# Patient Record
Sex: Male | Born: 1935
Health system: Southern US, Community
[De-identification: ages and names within clinical notes are randomized; demographics above are authoritative.]

## PROBLEM LIST (undated history)

## (undated) DIAGNOSIS — K219 Gastro-esophageal reflux disease without esophagitis: Secondary | ICD-10-CM

## (undated) DIAGNOSIS — C189 Malignant neoplasm of colon, unspecified: Secondary | ICD-10-CM

## (undated) DIAGNOSIS — E785 Hyperlipidemia, unspecified: Secondary | ICD-10-CM

## (undated) DIAGNOSIS — I10 Essential (primary) hypertension: Secondary | ICD-10-CM

## (undated) DIAGNOSIS — R7302 Impaired glucose tolerance (oral): Secondary | ICD-10-CM

## (undated) DIAGNOSIS — M48062 Spinal stenosis, lumbar region with neurogenic claudication: Secondary | ICD-10-CM

## (undated) DIAGNOSIS — J189 Pneumonia, unspecified organism: Secondary | ICD-10-CM

## (undated) DIAGNOSIS — E7800 Pure hypercholesterolemia, unspecified: Secondary | ICD-10-CM

## (undated) DIAGNOSIS — C801 Malignant (primary) neoplasm, unspecified: Secondary | ICD-10-CM

## (undated) DIAGNOSIS — R229 Localized swelling, mass and lump, unspecified: Secondary | ICD-10-CM

## (undated) DIAGNOSIS — Z87442 Personal history of urinary calculi: Secondary | ICD-10-CM

## (undated) DIAGNOSIS — Z973 Presence of spectacles and contact lenses: Secondary | ICD-10-CM

## (undated) DIAGNOSIS — C229 Malignant neoplasm of liver, not specified as primary or secondary: Secondary | ICD-10-CM

## (undated) DIAGNOSIS — M1990 Unspecified osteoarthritis, unspecified site: Secondary | ICD-10-CM

## (undated) HISTORY — DX: Essential (primary) hypertension: I10

## (undated) HISTORY — DX: Pure hypercholesterolemia, unspecified: E78.00

## (undated) HISTORY — PX: APPENDECTOMY: SHX54

## (undated) HISTORY — PX: MULTIPLE TOOTH EXTRACTIONS: SHX2053

## (undated) HISTORY — DX: Impaired glucose tolerance (oral): R73.02

## (undated) HISTORY — DX: Hyperlipidemia, unspecified: E78.5

## (undated) HISTORY — PX: COLONOSCOPY W/ BIOPSIES AND POLYPECTOMY: SHX1376

---

## 2003-12-04 ENCOUNTER — Encounter: Admission: RE | Admit: 2003-12-04 | Discharge: 2003-12-04 | Payer: Self-pay | Admitting: Neurological Surgery

## 2004-02-20 ENCOUNTER — Encounter: Admission: RE | Admit: 2004-02-20 | Discharge: 2004-02-20 | Payer: Self-pay | Admitting: Neurological Surgery

## 2009-07-19 ENCOUNTER — Encounter: Payer: Self-pay | Admitting: Cardiology

## 2009-07-20 ENCOUNTER — Encounter: Payer: Self-pay | Admitting: Cardiology

## 2009-09-03 DIAGNOSIS — E785 Hyperlipidemia, unspecified: Secondary | ICD-10-CM | POA: Insufficient documentation

## 2009-09-03 DIAGNOSIS — E782 Mixed hyperlipidemia: Secondary | ICD-10-CM | POA: Insufficient documentation

## 2009-09-04 ENCOUNTER — Ambulatory Visit: Payer: Self-pay | Admitting: Cardiology

## 2009-09-04 DIAGNOSIS — I491 Atrial premature depolarization: Secondary | ICD-10-CM | POA: Insufficient documentation

## 2009-09-04 DIAGNOSIS — R002 Palpitations: Secondary | ICD-10-CM

## 2009-09-13 ENCOUNTER — Encounter: Payer: Self-pay | Admitting: Cardiology

## 2009-09-13 ENCOUNTER — Ambulatory Visit: Payer: Self-pay | Admitting: Cardiology

## 2009-09-23 ENCOUNTER — Ambulatory Visit: Payer: Self-pay | Admitting: Cardiology

## 2009-09-25 ENCOUNTER — Encounter: Payer: Self-pay | Admitting: Cardiology

## 2009-09-26 ENCOUNTER — Encounter (INDEPENDENT_AMBULATORY_CARE_PROVIDER_SITE_OTHER): Payer: Self-pay | Admitting: *Deleted

## 2009-09-30 ENCOUNTER — Encounter (INDEPENDENT_AMBULATORY_CARE_PROVIDER_SITE_OTHER): Payer: Self-pay | Admitting: *Deleted

## 2009-10-07 ENCOUNTER — Encounter: Payer: Self-pay | Admitting: Cardiology

## 2009-10-14 ENCOUNTER — Ambulatory Visit: Payer: Self-pay | Admitting: Cardiology

## 2012-06-01 ENCOUNTER — Encounter: Payer: Self-pay | Admitting: *Deleted

## 2012-06-02 ENCOUNTER — Encounter: Payer: Self-pay | Admitting: *Deleted

## 2015-12-17 DIAGNOSIS — I10 Essential (primary) hypertension: Secondary | ICD-10-CM | POA: Diagnosis not present

## 2015-12-17 DIAGNOSIS — E119 Type 2 diabetes mellitus without complications: Secondary | ICD-10-CM | POA: Diagnosis not present

## 2015-12-17 DIAGNOSIS — E7800 Pure hypercholesterolemia, unspecified: Secondary | ICD-10-CM | POA: Diagnosis not present

## 2015-12-17 DIAGNOSIS — N182 Chronic kidney disease, stage 2 (mild): Secondary | ICD-10-CM | POA: Diagnosis not present

## 2015-12-17 DIAGNOSIS — E039 Hypothyroidism, unspecified: Secondary | ICD-10-CM | POA: Diagnosis not present

## 2015-12-24 DIAGNOSIS — I10 Essential (primary) hypertension: Secondary | ICD-10-CM | POA: Diagnosis not present

## 2015-12-24 DIAGNOSIS — E1129 Type 2 diabetes mellitus with other diabetic kidney complication: Secondary | ICD-10-CM | POA: Diagnosis not present

## 2016-04-16 DIAGNOSIS — E7800 Pure hypercholesterolemia, unspecified: Secondary | ICD-10-CM | POA: Diagnosis not present

## 2016-04-16 DIAGNOSIS — Z13220 Encounter for screening for lipoid disorders: Secondary | ICD-10-CM | POA: Diagnosis not present

## 2016-04-16 DIAGNOSIS — N182 Chronic kidney disease, stage 2 (mild): Secondary | ICD-10-CM | POA: Diagnosis not present

## 2016-04-16 DIAGNOSIS — E1129 Type 2 diabetes mellitus with other diabetic kidney complication: Secondary | ICD-10-CM | POA: Diagnosis not present

## 2016-04-16 DIAGNOSIS — E039 Hypothyroidism, unspecified: Secondary | ICD-10-CM | POA: Diagnosis not present

## 2016-04-16 DIAGNOSIS — I10 Essential (primary) hypertension: Secondary | ICD-10-CM | POA: Diagnosis not present

## 2016-04-21 DIAGNOSIS — E7800 Pure hypercholesterolemia, unspecified: Secondary | ICD-10-CM | POA: Diagnosis not present

## 2016-04-21 DIAGNOSIS — I10 Essential (primary) hypertension: Secondary | ICD-10-CM | POA: Diagnosis not present

## 2016-04-21 DIAGNOSIS — E039 Hypothyroidism, unspecified: Secondary | ICD-10-CM | POA: Diagnosis not present

## 2016-04-21 DIAGNOSIS — N182 Chronic kidney disease, stage 2 (mild): Secondary | ICD-10-CM | POA: Diagnosis not present

## 2016-04-21 DIAGNOSIS — N401 Enlarged prostate with lower urinary tract symptoms: Secondary | ICD-10-CM | POA: Diagnosis not present

## 2016-04-21 DIAGNOSIS — M7552 Bursitis of left shoulder: Secondary | ICD-10-CM | POA: Diagnosis not present

## 2016-04-21 DIAGNOSIS — E1129 Type 2 diabetes mellitus with other diabetic kidney complication: Secondary | ICD-10-CM | POA: Diagnosis not present

## 2016-07-28 DIAGNOSIS — Z6824 Body mass index (BMI) 24.0-24.9, adult: Secondary | ICD-10-CM | POA: Diagnosis not present

## 2016-07-28 DIAGNOSIS — M545 Low back pain: Secondary | ICD-10-CM | POA: Diagnosis not present

## 2016-09-24 DIAGNOSIS — Z23 Encounter for immunization: Secondary | ICD-10-CM | POA: Diagnosis not present

## 2016-10-20 DIAGNOSIS — E1129 Type 2 diabetes mellitus with other diabetic kidney complication: Secondary | ICD-10-CM | POA: Diagnosis not present

## 2016-10-20 DIAGNOSIS — N182 Chronic kidney disease, stage 2 (mild): Secondary | ICD-10-CM | POA: Diagnosis not present

## 2016-10-20 DIAGNOSIS — I10 Essential (primary) hypertension: Secondary | ICD-10-CM | POA: Diagnosis not present

## 2016-10-20 DIAGNOSIS — E7800 Pure hypercholesterolemia, unspecified: Secondary | ICD-10-CM | POA: Diagnosis not present

## 2016-10-27 DIAGNOSIS — N183 Chronic kidney disease, stage 3 (moderate): Secondary | ICD-10-CM | POA: Diagnosis not present

## 2016-10-27 DIAGNOSIS — Z0001 Encounter for general adult medical examination with abnormal findings: Secondary | ICD-10-CM | POA: Diagnosis not present

## 2016-12-29 DIAGNOSIS — L570 Actinic keratosis: Secondary | ICD-10-CM | POA: Diagnosis not present

## 2017-04-16 DIAGNOSIS — I10 Essential (primary) hypertension: Secondary | ICD-10-CM | POA: Diagnosis not present

## 2017-04-16 DIAGNOSIS — E782 Mixed hyperlipidemia: Secondary | ICD-10-CM | POA: Diagnosis not present

## 2017-04-16 DIAGNOSIS — E1129 Type 2 diabetes mellitus with other diabetic kidney complication: Secondary | ICD-10-CM | POA: Diagnosis not present

## 2017-04-16 DIAGNOSIS — E7800 Pure hypercholesterolemia, unspecified: Secondary | ICD-10-CM | POA: Diagnosis not present

## 2017-04-16 DIAGNOSIS — E039 Hypothyroidism, unspecified: Secondary | ICD-10-CM | POA: Diagnosis not present

## 2017-04-20 DIAGNOSIS — I10 Essential (primary) hypertension: Secondary | ICD-10-CM | POA: Diagnosis not present

## 2017-04-20 DIAGNOSIS — M7552 Bursitis of left shoulder: Secondary | ICD-10-CM | POA: Diagnosis not present

## 2017-04-20 DIAGNOSIS — E1129 Type 2 diabetes mellitus with other diabetic kidney complication: Secondary | ICD-10-CM | POA: Diagnosis not present

## 2017-04-20 DIAGNOSIS — N401 Enlarged prostate with lower urinary tract symptoms: Secondary | ICD-10-CM | POA: Diagnosis not present

## 2017-04-20 DIAGNOSIS — E7800 Pure hypercholesterolemia, unspecified: Secondary | ICD-10-CM | POA: Diagnosis not present

## 2017-07-06 DIAGNOSIS — Z85828 Personal history of other malignant neoplasm of skin: Secondary | ICD-10-CM | POA: Diagnosis not present

## 2017-07-06 DIAGNOSIS — L570 Actinic keratosis: Secondary | ICD-10-CM | POA: Diagnosis not present

## 2017-07-06 DIAGNOSIS — L259 Unspecified contact dermatitis, unspecified cause: Secondary | ICD-10-CM | POA: Diagnosis not present

## 2017-10-19 DIAGNOSIS — I10 Essential (primary) hypertension: Secondary | ICD-10-CM | POA: Diagnosis not present

## 2017-10-19 DIAGNOSIS — N183 Chronic kidney disease, stage 3 (moderate): Secondary | ICD-10-CM | POA: Diagnosis not present

## 2017-10-19 DIAGNOSIS — E1129 Type 2 diabetes mellitus with other diabetic kidney complication: Secondary | ICD-10-CM | POA: Diagnosis not present

## 2017-10-19 DIAGNOSIS — E782 Mixed hyperlipidemia: Secondary | ICD-10-CM | POA: Diagnosis not present

## 2017-10-19 DIAGNOSIS — E7800 Pure hypercholesterolemia, unspecified: Secondary | ICD-10-CM | POA: Diagnosis not present

## 2017-10-22 DIAGNOSIS — E1129 Type 2 diabetes mellitus with other diabetic kidney complication: Secondary | ICD-10-CM | POA: Diagnosis not present

## 2017-10-22 DIAGNOSIS — Z23 Encounter for immunization: Secondary | ICD-10-CM | POA: Diagnosis not present

## 2017-10-22 DIAGNOSIS — R5383 Other fatigue: Secondary | ICD-10-CM | POA: Diagnosis not present

## 2017-10-22 DIAGNOSIS — I10 Essential (primary) hypertension: Secondary | ICD-10-CM | POA: Diagnosis not present

## 2017-10-22 DIAGNOSIS — Z0001 Encounter for general adult medical examination with abnormal findings: Secondary | ICD-10-CM | POA: Diagnosis not present

## 2017-10-22 DIAGNOSIS — N401 Enlarged prostate with lower urinary tract symptoms: Secondary | ICD-10-CM | POA: Diagnosis not present

## 2017-10-22 DIAGNOSIS — E7800 Pure hypercholesterolemia, unspecified: Secondary | ICD-10-CM | POA: Diagnosis not present

## 2017-11-10 DIAGNOSIS — H40033 Anatomical narrow angle, bilateral: Secondary | ICD-10-CM | POA: Diagnosis not present

## 2017-11-10 DIAGNOSIS — G44219 Episodic tension-type headache, not intractable: Secondary | ICD-10-CM | POA: Diagnosis not present

## 2018-01-04 DIAGNOSIS — L570 Actinic keratosis: Secondary | ICD-10-CM | POA: Diagnosis not present

## 2018-02-01 DIAGNOSIS — L0100 Impetigo, unspecified: Secondary | ICD-10-CM | POA: Diagnosis not present

## 2018-02-01 DIAGNOSIS — L03019 Cellulitis of unspecified finger: Secondary | ICD-10-CM | POA: Diagnosis not present

## 2018-02-08 DIAGNOSIS — L03019 Cellulitis of unspecified finger: Secondary | ICD-10-CM | POA: Diagnosis not present

## 2018-02-15 DIAGNOSIS — S61309A Unspecified open wound of unspecified finger with damage to nail, initial encounter: Secondary | ICD-10-CM | POA: Diagnosis not present

## 2018-02-15 DIAGNOSIS — L0100 Impetigo, unspecified: Secondary | ICD-10-CM | POA: Diagnosis not present

## 2018-03-01 DIAGNOSIS — E039 Hypothyroidism, unspecified: Secondary | ICD-10-CM | POA: Diagnosis not present

## 2018-03-01 DIAGNOSIS — E1129 Type 2 diabetes mellitus with other diabetic kidney complication: Secondary | ICD-10-CM | POA: Diagnosis not present

## 2018-03-01 DIAGNOSIS — E782 Mixed hyperlipidemia: Secondary | ICD-10-CM | POA: Diagnosis not present

## 2018-03-01 DIAGNOSIS — E7800 Pure hypercholesterolemia, unspecified: Secondary | ICD-10-CM | POA: Diagnosis not present

## 2018-03-01 DIAGNOSIS — I10 Essential (primary) hypertension: Secondary | ICD-10-CM | POA: Diagnosis not present

## 2018-03-08 DIAGNOSIS — E7800 Pure hypercholesterolemia, unspecified: Secondary | ICD-10-CM | POA: Diagnosis not present

## 2018-03-08 DIAGNOSIS — E1129 Type 2 diabetes mellitus with other diabetic kidney complication: Secondary | ICD-10-CM | POA: Diagnosis not present

## 2018-03-08 DIAGNOSIS — N401 Enlarged prostate with lower urinary tract symptoms: Secondary | ICD-10-CM | POA: Diagnosis not present

## 2018-03-08 DIAGNOSIS — R5383 Other fatigue: Secondary | ICD-10-CM | POA: Diagnosis not present

## 2018-03-08 DIAGNOSIS — I10 Essential (primary) hypertension: Secondary | ICD-10-CM | POA: Diagnosis not present

## 2018-07-05 DIAGNOSIS — D485 Neoplasm of uncertain behavior of skin: Secondary | ICD-10-CM | POA: Diagnosis not present

## 2018-07-05 DIAGNOSIS — I10 Essential (primary) hypertension: Secondary | ICD-10-CM | POA: Diagnosis not present

## 2018-07-05 DIAGNOSIS — E7800 Pure hypercholesterolemia, unspecified: Secondary | ICD-10-CM | POA: Diagnosis not present

## 2018-07-05 DIAGNOSIS — N183 Chronic kidney disease, stage 3 (moderate): Secondary | ICD-10-CM | POA: Diagnosis not present

## 2018-07-05 DIAGNOSIS — E1129 Type 2 diabetes mellitus with other diabetic kidney complication: Secondary | ICD-10-CM | POA: Diagnosis not present

## 2018-07-05 DIAGNOSIS — L570 Actinic keratosis: Secondary | ICD-10-CM | POA: Diagnosis not present

## 2018-07-05 DIAGNOSIS — R5383 Other fatigue: Secondary | ICD-10-CM | POA: Diagnosis not present

## 2018-07-08 DIAGNOSIS — N183 Chronic kidney disease, stage 3 (moderate): Secondary | ICD-10-CM | POA: Diagnosis not present

## 2018-07-08 DIAGNOSIS — E7800 Pure hypercholesterolemia, unspecified: Secondary | ICD-10-CM | POA: Diagnosis not present

## 2018-07-08 DIAGNOSIS — E1129 Type 2 diabetes mellitus with other diabetic kidney complication: Secondary | ICD-10-CM | POA: Diagnosis not present

## 2018-07-08 DIAGNOSIS — N401 Enlarged prostate with lower urinary tract symptoms: Secondary | ICD-10-CM | POA: Diagnosis not present

## 2018-07-08 DIAGNOSIS — I10 Essential (primary) hypertension: Secondary | ICD-10-CM | POA: Diagnosis not present

## 2018-09-03 DIAGNOSIS — Z23 Encounter for immunization: Secondary | ICD-10-CM | POA: Diagnosis not present

## 2018-10-11 DIAGNOSIS — J40 Bronchitis, not specified as acute or chronic: Secondary | ICD-10-CM | POA: Diagnosis not present

## 2018-10-19 DIAGNOSIS — L0100 Impetigo, unspecified: Secondary | ICD-10-CM | POA: Diagnosis not present

## 2018-10-27 DIAGNOSIS — D485 Neoplasm of uncertain behavior of skin: Secondary | ICD-10-CM | POA: Diagnosis not present

## 2018-10-27 DIAGNOSIS — L120 Bullous pemphigoid: Secondary | ICD-10-CM | POA: Diagnosis not present

## 2018-10-27 DIAGNOSIS — L859 Epidermal thickening, unspecified: Secondary | ICD-10-CM | POA: Diagnosis not present

## 2018-11-08 DIAGNOSIS — L03011 Cellulitis of right finger: Secondary | ICD-10-CM | POA: Diagnosis not present

## 2018-11-09 DIAGNOSIS — H40033 Anatomical narrow angle, bilateral: Secondary | ICD-10-CM | POA: Diagnosis not present

## 2018-11-09 DIAGNOSIS — H2513 Age-related nuclear cataract, bilateral: Secondary | ICD-10-CM | POA: Diagnosis not present

## 2018-11-11 DIAGNOSIS — E782 Mixed hyperlipidemia: Secondary | ICD-10-CM | POA: Diagnosis not present

## 2018-11-11 DIAGNOSIS — E1129 Type 2 diabetes mellitus with other diabetic kidney complication: Secondary | ICD-10-CM | POA: Diagnosis not present

## 2018-11-11 DIAGNOSIS — E039 Hypothyroidism, unspecified: Secondary | ICD-10-CM | POA: Diagnosis not present

## 2018-11-11 DIAGNOSIS — I10 Essential (primary) hypertension: Secondary | ICD-10-CM | POA: Diagnosis not present

## 2018-11-11 DIAGNOSIS — E7800 Pure hypercholesterolemia, unspecified: Secondary | ICD-10-CM | POA: Diagnosis not present

## 2018-11-14 DIAGNOSIS — R5383 Other fatigue: Secondary | ICD-10-CM | POA: Diagnosis not present

## 2018-11-14 DIAGNOSIS — Z0001 Encounter for general adult medical examination with abnormal findings: Secondary | ICD-10-CM | POA: Diagnosis not present

## 2018-11-14 DIAGNOSIS — I10 Essential (primary) hypertension: Secondary | ICD-10-CM | POA: Diagnosis not present

## 2018-11-14 DIAGNOSIS — E7800 Pure hypercholesterolemia, unspecified: Secondary | ICD-10-CM | POA: Diagnosis not present

## 2018-11-14 DIAGNOSIS — E1129 Type 2 diabetes mellitus with other diabetic kidney complication: Secondary | ICD-10-CM | POA: Diagnosis not present

## 2018-11-22 DIAGNOSIS — L0100 Impetigo, unspecified: Secondary | ICD-10-CM | POA: Diagnosis not present

## 2019-01-04 DIAGNOSIS — L570 Actinic keratosis: Secondary | ICD-10-CM | POA: Diagnosis not present

## 2019-03-13 DIAGNOSIS — E782 Mixed hyperlipidemia: Secondary | ICD-10-CM | POA: Diagnosis not present

## 2019-03-13 DIAGNOSIS — I10 Essential (primary) hypertension: Secondary | ICD-10-CM | POA: Diagnosis not present

## 2019-03-13 DIAGNOSIS — R945 Abnormal results of liver function studies: Secondary | ICD-10-CM | POA: Diagnosis not present

## 2019-03-13 DIAGNOSIS — E1129 Type 2 diabetes mellitus with other diabetic kidney complication: Secondary | ICD-10-CM | POA: Diagnosis not present

## 2019-03-13 DIAGNOSIS — E7800 Pure hypercholesterolemia, unspecified: Secondary | ICD-10-CM | POA: Diagnosis not present

## 2019-03-13 DIAGNOSIS — N183 Chronic kidney disease, stage 3 (moderate): Secondary | ICD-10-CM | POA: Diagnosis not present

## 2019-03-16 DIAGNOSIS — N183 Chronic kidney disease, stage 3 (moderate): Secondary | ICD-10-CM | POA: Diagnosis not present

## 2019-03-16 DIAGNOSIS — I10 Essential (primary) hypertension: Secondary | ICD-10-CM | POA: Diagnosis not present

## 2019-03-16 DIAGNOSIS — E1129 Type 2 diabetes mellitus with other diabetic kidney complication: Secondary | ICD-10-CM | POA: Diagnosis not present

## 2019-03-16 DIAGNOSIS — E7800 Pure hypercholesterolemia, unspecified: Secondary | ICD-10-CM | POA: Diagnosis not present

## 2019-07-03 DIAGNOSIS — Z85828 Personal history of other malignant neoplasm of skin: Secondary | ICD-10-CM | POA: Diagnosis not present

## 2019-07-03 DIAGNOSIS — L570 Actinic keratosis: Secondary | ICD-10-CM | POA: Diagnosis not present

## 2019-07-03 DIAGNOSIS — L259 Unspecified contact dermatitis, unspecified cause: Secondary | ICD-10-CM | POA: Diagnosis not present

## 2019-09-11 DIAGNOSIS — E039 Hypothyroidism, unspecified: Secondary | ICD-10-CM | POA: Diagnosis not present

## 2019-09-11 DIAGNOSIS — E782 Mixed hyperlipidemia: Secondary | ICD-10-CM | POA: Diagnosis not present

## 2019-09-11 DIAGNOSIS — R5383 Other fatigue: Secondary | ICD-10-CM | POA: Diagnosis not present

## 2019-09-11 DIAGNOSIS — I10 Essential (primary) hypertension: Secondary | ICD-10-CM | POA: Diagnosis not present

## 2019-09-11 DIAGNOSIS — N183 Chronic kidney disease, stage 3 (moderate): Secondary | ICD-10-CM | POA: Diagnosis not present

## 2019-09-11 DIAGNOSIS — E1129 Type 2 diabetes mellitus with other diabetic kidney complication: Secondary | ICD-10-CM | POA: Diagnosis not present

## 2019-09-14 DIAGNOSIS — Z6823 Body mass index (BMI) 23.0-23.9, adult: Secondary | ICD-10-CM | POA: Diagnosis not present

## 2019-09-14 DIAGNOSIS — Z23 Encounter for immunization: Secondary | ICD-10-CM | POA: Diagnosis not present

## 2019-09-14 DIAGNOSIS — E7800 Pure hypercholesterolemia, unspecified: Secondary | ICD-10-CM | POA: Diagnosis not present

## 2019-09-14 DIAGNOSIS — R945 Abnormal results of liver function studies: Secondary | ICD-10-CM | POA: Diagnosis not present

## 2019-09-14 DIAGNOSIS — E1129 Type 2 diabetes mellitus with other diabetic kidney complication: Secondary | ICD-10-CM | POA: Diagnosis not present

## 2019-09-14 DIAGNOSIS — I10 Essential (primary) hypertension: Secondary | ICD-10-CM | POA: Diagnosis not present

## 2019-12-01 DIAGNOSIS — M545 Low back pain: Secondary | ICD-10-CM | POA: Diagnosis not present

## 2019-12-01 DIAGNOSIS — M5430 Sciatica, unspecified side: Secondary | ICD-10-CM | POA: Diagnosis not present

## 2020-01-23 DIAGNOSIS — R945 Abnormal results of liver function studies: Secondary | ICD-10-CM | POA: Diagnosis not present

## 2020-01-23 DIAGNOSIS — I10 Essential (primary) hypertension: Secondary | ICD-10-CM | POA: Diagnosis not present

## 2020-01-23 DIAGNOSIS — N183 Chronic kidney disease, stage 3 unspecified: Secondary | ICD-10-CM | POA: Diagnosis not present

## 2020-01-23 DIAGNOSIS — Z6822 Body mass index (BMI) 22.0-22.9, adult: Secondary | ICD-10-CM | POA: Diagnosis not present

## 2020-01-23 DIAGNOSIS — E1129 Type 2 diabetes mellitus with other diabetic kidney complication: Secondary | ICD-10-CM | POA: Diagnosis not present

## 2020-01-23 DIAGNOSIS — E7800 Pure hypercholesterolemia, unspecified: Secondary | ICD-10-CM | POA: Diagnosis not present

## 2020-02-19 DIAGNOSIS — R2689 Other abnormalities of gait and mobility: Secondary | ICD-10-CM | POA: Diagnosis not present

## 2020-02-19 DIAGNOSIS — R531 Weakness: Secondary | ICD-10-CM | POA: Diagnosis not present

## 2020-02-19 DIAGNOSIS — R262 Difficulty in walking, not elsewhere classified: Secondary | ICD-10-CM | POA: Diagnosis not present

## 2020-02-26 DIAGNOSIS — R531 Weakness: Secondary | ICD-10-CM | POA: Diagnosis not present

## 2020-02-26 DIAGNOSIS — R262 Difficulty in walking, not elsewhere classified: Secondary | ICD-10-CM | POA: Diagnosis not present

## 2020-02-26 DIAGNOSIS — Z6822 Body mass index (BMI) 22.0-22.9, adult: Secondary | ICD-10-CM | POA: Diagnosis not present

## 2020-02-26 DIAGNOSIS — R2689 Other abnormalities of gait and mobility: Secondary | ICD-10-CM | POA: Diagnosis not present

## 2020-02-26 DIAGNOSIS — L89319 Pressure ulcer of right buttock, unspecified stage: Secondary | ICD-10-CM | POA: Diagnosis not present

## 2020-02-28 DIAGNOSIS — R2689 Other abnormalities of gait and mobility: Secondary | ICD-10-CM | POA: Diagnosis not present

## 2020-02-28 DIAGNOSIS — R262 Difficulty in walking, not elsewhere classified: Secondary | ICD-10-CM | POA: Diagnosis not present

## 2020-02-28 DIAGNOSIS — R531 Weakness: Secondary | ICD-10-CM | POA: Diagnosis not present

## 2020-03-04 DIAGNOSIS — M545 Low back pain: Secondary | ICD-10-CM | POA: Diagnosis not present

## 2020-03-04 DIAGNOSIS — M48061 Spinal stenosis, lumbar region without neurogenic claudication: Secondary | ICD-10-CM | POA: Diagnosis not present

## 2020-03-04 DIAGNOSIS — M4316 Spondylolisthesis, lumbar region: Secondary | ICD-10-CM | POA: Diagnosis not present

## 2020-03-04 DIAGNOSIS — M5136 Other intervertebral disc degeneration, lumbar region: Secondary | ICD-10-CM | POA: Diagnosis not present

## 2020-03-04 DIAGNOSIS — S32050A Wedge compression fracture of fifth lumbar vertebra, initial encounter for closed fracture: Secondary | ICD-10-CM | POA: Diagnosis not present

## 2020-03-04 DIAGNOSIS — M5137 Other intervertebral disc degeneration, lumbosacral region: Secondary | ICD-10-CM | POA: Diagnosis not present

## 2020-03-09 DIAGNOSIS — I10 Essential (primary) hypertension: Secondary | ICD-10-CM | POA: Diagnosis not present

## 2020-03-09 DIAGNOSIS — Z9181 History of falling: Secondary | ICD-10-CM | POA: Diagnosis not present

## 2020-03-09 DIAGNOSIS — M6281 Muscle weakness (generalized): Secondary | ICD-10-CM | POA: Diagnosis not present

## 2020-03-09 DIAGNOSIS — Z4800 Encounter for change or removal of nonsurgical wound dressing: Secondary | ICD-10-CM | POA: Diagnosis not present

## 2020-03-09 DIAGNOSIS — L89312 Pressure ulcer of right buttock, stage 2: Secondary | ICD-10-CM | POA: Diagnosis not present

## 2020-03-09 DIAGNOSIS — Z7982 Long term (current) use of aspirin: Secondary | ICD-10-CM | POA: Diagnosis not present

## 2020-03-09 DIAGNOSIS — Z79891 Long term (current) use of opiate analgesic: Secondary | ICD-10-CM | POA: Diagnosis not present

## 2020-03-09 DIAGNOSIS — M545 Low back pain: Secondary | ICD-10-CM | POA: Diagnosis not present

## 2020-03-12 DIAGNOSIS — Z7982 Long term (current) use of aspirin: Secondary | ICD-10-CM | POA: Diagnosis not present

## 2020-03-12 DIAGNOSIS — I10 Essential (primary) hypertension: Secondary | ICD-10-CM | POA: Diagnosis not present

## 2020-03-12 DIAGNOSIS — M545 Low back pain: Secondary | ICD-10-CM | POA: Diagnosis not present

## 2020-03-12 DIAGNOSIS — Z79891 Long term (current) use of opiate analgesic: Secondary | ICD-10-CM | POA: Diagnosis not present

## 2020-03-12 DIAGNOSIS — Z9181 History of falling: Secondary | ICD-10-CM | POA: Diagnosis not present

## 2020-03-12 DIAGNOSIS — M6281 Muscle weakness (generalized): Secondary | ICD-10-CM | POA: Diagnosis not present

## 2020-03-12 DIAGNOSIS — Z4800 Encounter for change or removal of nonsurgical wound dressing: Secondary | ICD-10-CM | POA: Diagnosis not present

## 2020-03-12 DIAGNOSIS — L89312 Pressure ulcer of right buttock, stage 2: Secondary | ICD-10-CM | POA: Diagnosis not present

## 2020-03-13 ENCOUNTER — Encounter (HOSPITAL_COMMUNITY): Payer: Self-pay | Admitting: Neurological Surgery

## 2020-03-13 ENCOUNTER — Other Ambulatory Visit: Payer: Self-pay | Admitting: Neurological Surgery

## 2020-03-13 DIAGNOSIS — M21372 Foot drop, left foot: Secondary | ICD-10-CM | POA: Diagnosis not present

## 2020-03-13 DIAGNOSIS — M48062 Spinal stenosis, lumbar region with neurogenic claudication: Secondary | ICD-10-CM | POA: Diagnosis not present

## 2020-03-13 DIAGNOSIS — I10 Essential (primary) hypertension: Secondary | ICD-10-CM | POA: Diagnosis not present

## 2020-03-15 ENCOUNTER — Other Ambulatory Visit: Payer: Self-pay

## 2020-03-15 ENCOUNTER — Encounter (HOSPITAL_COMMUNITY): Payer: Self-pay | Admitting: Neurological Surgery

## 2020-03-16 ENCOUNTER — Other Ambulatory Visit (HOSPITAL_COMMUNITY)
Admission: RE | Admit: 2020-03-16 | Discharge: 2020-03-16 | Disposition: A | Discharge status: Home / Self Care | Payer: Medicare Other | Source: Ambulatory Visit | Attending: Neurological Surgery | Admitting: Neurological Surgery

## 2020-03-16 DIAGNOSIS — M1990 Unspecified osteoarthritis, unspecified site: Secondary | ICD-10-CM | POA: Diagnosis not present

## 2020-03-16 DIAGNOSIS — E7800 Pure hypercholesterolemia, unspecified: Secondary | ICD-10-CM | POA: Diagnosis not present

## 2020-03-16 DIAGNOSIS — L720 Epidermal cyst: Secondary | ICD-10-CM | POA: Diagnosis not present

## 2020-03-16 DIAGNOSIS — M48062 Spinal stenosis, lumbar region with neurogenic claudication: Secondary | ICD-10-CM | POA: Diagnosis not present

## 2020-03-16 DIAGNOSIS — M4726 Other spondylosis with radiculopathy, lumbar region: Secondary | ICD-10-CM | POA: Diagnosis not present

## 2020-03-16 DIAGNOSIS — Z20822 Contact with and (suspected) exposure to covid-19: Secondary | ICD-10-CM | POA: Diagnosis not present

## 2020-03-16 DIAGNOSIS — Z79899 Other long term (current) drug therapy: Secondary | ICD-10-CM | POA: Diagnosis not present

## 2020-03-16 DIAGNOSIS — I10 Essential (primary) hypertension: Secondary | ICD-10-CM | POA: Diagnosis not present

## 2020-03-16 DIAGNOSIS — M21372 Foot drop, left foot: Secondary | ICD-10-CM | POA: Diagnosis not present

## 2020-03-16 LAB — SARS CORONAVIRUS 2 (TAT 6-24 HRS): SARS Coronavirus 2: NEGATIVE

## 2020-03-18 ENCOUNTER — Inpatient Hospital Stay (HOSPITAL_COMMUNITY)
Admission: RE | Admit: 2020-03-18 | Discharge: 2020-03-19 | DRG: 520 | Disposition: A | Discharge status: Home Health | Payer: Medicare Other | Attending: Neurological Surgery | Admitting: Neurological Surgery

## 2020-03-18 ENCOUNTER — Ambulatory Visit (HOSPITAL_COMMUNITY): Payer: Medicare Other | Admitting: Anesthesiology

## 2020-03-18 ENCOUNTER — Ambulatory Visit (HOSPITAL_COMMUNITY): Payer: Medicare Other

## 2020-03-18 ENCOUNTER — Other Ambulatory Visit: Payer: Self-pay

## 2020-03-18 ENCOUNTER — Encounter (HOSPITAL_COMMUNITY)
Admission: RE | Disposition: A | Discharge status: Home / Self Care | Payer: Self-pay | Source: Home / Self Care | Attending: Neurological Surgery

## 2020-03-18 ENCOUNTER — Encounter (HOSPITAL_COMMUNITY): Payer: Self-pay | Admitting: Neurological Surgery

## 2020-03-18 DIAGNOSIS — E7800 Pure hypercholesterolemia, unspecified: Secondary | ICD-10-CM | POA: Diagnosis not present

## 2020-03-18 DIAGNOSIS — L720 Epidermal cyst: Secondary | ICD-10-CM | POA: Diagnosis not present

## 2020-03-18 DIAGNOSIS — M21372 Foot drop, left foot: Secondary | ICD-10-CM | POA: Diagnosis not present

## 2020-03-18 DIAGNOSIS — M4726 Other spondylosis with radiculopathy, lumbar region: Secondary | ICD-10-CM | POA: Diagnosis not present

## 2020-03-18 DIAGNOSIS — M48061 Spinal stenosis, lumbar region without neurogenic claudication: Secondary | ICD-10-CM | POA: Diagnosis present

## 2020-03-18 DIAGNOSIS — Z20822 Contact with and (suspected) exposure to covid-19: Secondary | ICD-10-CM | POA: Diagnosis present

## 2020-03-18 DIAGNOSIS — M5416 Radiculopathy, lumbar region: Secondary | ICD-10-CM | POA: Diagnosis not present

## 2020-03-18 DIAGNOSIS — L723 Sebaceous cyst: Secondary | ICD-10-CM | POA: Diagnosis not present

## 2020-03-18 DIAGNOSIS — E785 Hyperlipidemia, unspecified: Secondary | ICD-10-CM | POA: Diagnosis not present

## 2020-03-18 DIAGNOSIS — M48062 Spinal stenosis, lumbar region with neurogenic claudication: Principal | ICD-10-CM | POA: Diagnosis present

## 2020-03-18 DIAGNOSIS — Z79899 Other long term (current) drug therapy: Secondary | ICD-10-CM | POA: Diagnosis not present

## 2020-03-18 DIAGNOSIS — M1990 Unspecified osteoarthritis, unspecified site: Secondary | ICD-10-CM | POA: Diagnosis present

## 2020-03-18 DIAGNOSIS — Z419 Encounter for procedure for purposes other than remedying health state, unspecified: Secondary | ICD-10-CM

## 2020-03-18 DIAGNOSIS — I10 Essential (primary) hypertension: Secondary | ICD-10-CM | POA: Diagnosis not present

## 2020-03-18 DIAGNOSIS — Z981 Arthrodesis status: Secondary | ICD-10-CM | POA: Diagnosis not present

## 2020-03-18 HISTORY — DX: Gastro-esophageal reflux disease without esophagitis: K21.9

## 2020-03-18 HISTORY — DX: Spinal stenosis, lumbar region with neurogenic claudication: M48.062

## 2020-03-18 HISTORY — PX: MASS EXCISION: SHX2000

## 2020-03-18 HISTORY — DX: Pneumonia, unspecified organism: J18.9

## 2020-03-18 HISTORY — DX: Unspecified osteoarthritis, unspecified site: M19.90

## 2020-03-18 HISTORY — DX: Localized swelling, mass and lump, unspecified: R22.9

## 2020-03-18 HISTORY — DX: Presence of spectacles and contact lenses: Z97.3

## 2020-03-18 HISTORY — DX: Personal history of urinary calculi: Z87.442

## 2020-03-18 HISTORY — PX: LUMBAR LAMINECTOMY/DECOMPRESSION MICRODISCECTOMY: SHX5026

## 2020-03-18 LAB — CBC
HCT: 35.8 % — ABNORMAL LOW (ref 39.0–52.0)
Hemoglobin: 11.5 g/dL — ABNORMAL LOW (ref 13.0–17.0)
MCH: 34.5 pg — ABNORMAL HIGH (ref 26.0–34.0)
MCHC: 32.1 g/dL (ref 30.0–36.0)
MCV: 107.5 fL — ABNORMAL HIGH (ref 80.0–100.0)
Platelets: 310 10*3/uL (ref 150–400)
RBC: 3.33 MIL/uL — ABNORMAL LOW (ref 4.22–5.81)
RDW: 12.3 % (ref 11.5–15.5)
WBC: 8.4 10*3/uL (ref 4.0–10.5)
nRBC: 0 % (ref 0.0–0.2)

## 2020-03-18 LAB — BASIC METABOLIC PANEL
Anion gap: 9 (ref 5–15)
BUN: 23 mg/dL (ref 8–23)
CO2: 26 mmol/L (ref 22–32)
Calcium: 9.3 mg/dL (ref 8.9–10.3)
Chloride: 104 mmol/L (ref 98–111)
Creatinine, Ser: 1.05 mg/dL (ref 0.61–1.24)
GFR calc Af Amer: >60 mL/min (ref >60)
GFR calc non Af Amer: >60 mL/min (ref >60)
Glucose, Bld: 142 mg/dL — ABNORMAL HIGH (ref 70–99)
Potassium: 3.9 mmol/L (ref 3.5–5.1)
Sodium: 139 mmol/L (ref 135–145)

## 2020-03-18 SURGERY — LUMBAR LAMINECTOMY/DECOMPRESSION MICRODISCECTOMY 2 LEVELS
Anesthesia: General | Site: Buttocks | Laterality: Right

## 2020-03-18 MED ORDER — ACETAMINOPHEN 650 MG RE SUPP: 650.0000 mg | RECTAL | Status: DC | PRN

## 2020-03-18 MED ORDER — KETOROLAC TROMETHAMINE 15 MG/ML IJ SOLN
7.5000 mg | Freq: Four times a day (QID) | INTRAMUSCULAR | Status: DC
  Administered 2020-03-18 – 2020-03-19 (×2): 7.5 mg via INTRAVENOUS
  Filled 2020-03-18 (×3): qty 1

## 2020-03-18 MED ORDER — ONDANSETRON HCL 4 MG/2ML IJ SOLN
INTRAMUSCULAR | Status: AC
  Filled 2020-03-18: qty 2

## 2020-03-18 MED ORDER — METHOCARBAMOL 500 MG PO TABS
500.0000 mg | ORAL_TABLET | Freq: Four times a day (QID) | ORAL | Status: DC | PRN
  Administered 2020-03-18: 19:00:00 500 mg via ORAL
  Filled 2020-03-18: qty 1

## 2020-03-18 MED ORDER — LACTATED RINGERS IV SOLN: INTRAVENOUS | Status: DC

## 2020-03-18 MED ORDER — MORPHINE SULFATE (PF) 2 MG/ML IV SOLN: 2.0000 mg | INTRAVENOUS | Status: DC | PRN

## 2020-03-18 MED ORDER — METHOCARBAMOL 500 MG PO TABS
ORAL_TABLET | ORAL | Status: AC
  Filled 2020-03-18: qty 1

## 2020-03-18 MED ORDER — OXYCODONE-ACETAMINOPHEN 5-325 MG PO TABS
ORAL_TABLET | ORAL | Status: AC
  Filled 2020-03-18: qty 1

## 2020-03-18 MED ORDER — SODIUM CHLORIDE 0.9% FLUSH
3.0000 mL | Freq: Two times a day (BID) | INTRAVENOUS | Status: DC
  Administered 2020-03-19 (×2): 3 mL via INTRAVENOUS

## 2020-03-18 MED ORDER — 0.9 % SODIUM CHLORIDE (POUR BTL) OPTIME
TOPICAL | Status: DC | PRN
  Administered 2020-03-18: 15:00:00 1000 mL

## 2020-03-18 MED ORDER — ONDANSETRON HCL 4 MG/2ML IJ SOLN: 4.0000 mg | Freq: Four times a day (QID) | INTRAMUSCULAR | Status: DC | PRN

## 2020-03-18 MED ORDER — PHENYLEPHRINE HCL-NACL 10-0.9 MG/250ML-% IV SOLN
INTRAVENOUS | Status: DC | PRN
  Administered 2020-03-18: 25 ug/min via INTRAVENOUS

## 2020-03-18 MED ORDER — LIDOCAINE 2% (20 MG/ML) 5 ML SYRINGE
INTRAMUSCULAR | Status: DC | PRN
  Administered 2020-03-18: 60 mg via INTRAVENOUS

## 2020-03-18 MED ORDER — ONDANSETRON HCL 4 MG/2ML IJ SOLN
INTRAMUSCULAR | Status: DC | PRN
  Administered 2020-03-18: 4 mg via INTRAVENOUS

## 2020-03-18 MED ORDER — ROCURONIUM BROMIDE 10 MG/ML (PF) SYRINGE
PREFILLED_SYRINGE | INTRAVENOUS | Status: DC | PRN
  Administered 2020-03-18: 40 mg via INTRAVENOUS

## 2020-03-18 MED ORDER — FENTANYL CITRATE (PF) 100 MCG/2ML IJ SOLN
INTRAMUSCULAR | Status: AC
  Filled 2020-03-18: qty 2

## 2020-03-18 MED ORDER — PROPOFOL 10 MG/ML IV BOLUS
INTRAVENOUS | Status: AC
  Filled 2020-03-18: qty 20

## 2020-03-18 MED ORDER — ATORVASTATIN CALCIUM 10 MG PO TABS
20.0000 mg | ORAL_TABLET | Freq: Every day | ORAL | Status: DC
  Administered 2020-03-19: 20 mg via ORAL
  Filled 2020-03-18: qty 2

## 2020-03-18 MED ORDER — SUCCINYLCHOLINE CHLORIDE 200 MG/10ML IV SOSY
PREFILLED_SYRINGE | INTRAVENOUS | Status: AC
  Filled 2020-03-18: qty 10

## 2020-03-18 MED ORDER — HYDROCHLOROTHIAZIDE 25 MG PO TABS: 25.0000 mg | ORAL_TABLET | Freq: Every day | ORAL | Status: DC | PRN

## 2020-03-18 MED ORDER — ALUM & MAG HYDROXIDE-SIMETH 200-200-20 MG/5ML PO SUSP: 30.0000 mL | Freq: Four times a day (QID) | ORAL | Status: DC | PRN

## 2020-03-18 MED ORDER — MENTHOL 3 MG MT LOZG: 1.0000 | LOZENGE | OROMUCOSAL | Status: DC | PRN

## 2020-03-18 MED ORDER — DEXAMETHASONE SODIUM PHOSPHATE 4 MG/ML IJ SOLN
INTRAMUSCULAR | Status: DC | PRN
  Administered 2020-03-18: 5 mg via INTRAVENOUS

## 2020-03-18 MED ORDER — PHENOL 1.4 % MT LIQD: 1.0000 | OROMUCOSAL | Status: DC | PRN

## 2020-03-18 MED ORDER — METHOCARBAMOL 1000 MG/10ML IJ SOLN: 500.0000 mg | Freq: Four times a day (QID) | INTRAVENOUS | Status: DC | PRN

## 2020-03-18 MED ORDER — DEXAMETHASONE SODIUM PHOSPHATE 10 MG/ML IJ SOLN
INTRAMUSCULAR | Status: AC
  Filled 2020-03-18: qty 1

## 2020-03-18 MED ORDER — ROCURONIUM BROMIDE 10 MG/ML (PF) SYRINGE
PREFILLED_SYRINGE | INTRAVENOUS | Status: AC
  Filled 2020-03-18: qty 10

## 2020-03-18 MED ORDER — SODIUM CHLORIDE 0.9 % IV SOLN: 250.0000 mL | INTRAVENOUS | Status: DC

## 2020-03-18 MED ORDER — KETOROLAC TROMETHAMINE 15 MG/ML IJ SOLN
INTRAMUSCULAR | Status: AC
  Filled 2020-03-18: qty 1

## 2020-03-18 MED ORDER — ONDANSETRON HCL 4 MG PO TABS: 4.0000 mg | ORAL_TABLET | Freq: Four times a day (QID) | ORAL | Status: DC | PRN

## 2020-03-18 MED ORDER — FLEET ENEMA 7-19 GM/118ML RE ENEM: 1.0000 | ENEMA | Freq: Once | RECTAL | Status: DC | PRN

## 2020-03-18 MED ORDER — DIPHENHYDRAMINE HCL 50 MG/ML IJ SOLN
INTRAMUSCULAR | Status: DC | PRN
  Administered 2020-03-18: 6.25 mg via INTRAVENOUS

## 2020-03-18 MED ORDER — SODIUM CHLORIDE 0.9 % IV SOLN
INTRAVENOUS | Status: DC | PRN
  Administered 2020-03-18: 500 mL

## 2020-03-18 MED ORDER — EPHEDRINE 5 MG/ML INJ
INTRAVENOUS | Status: AC
  Filled 2020-03-18: qty 10

## 2020-03-18 MED ORDER — THROMBIN 5000 UNITS EX SOLR
CUTANEOUS | Status: AC
  Filled 2020-03-18: qty 5000

## 2020-03-18 MED ORDER — SODIUM CHLORIDE 0.9% FLUSH: 3.0000 mL | INTRAVENOUS | Status: DC | PRN

## 2020-03-18 MED ORDER — TRAMADOL HCL 50 MG PO TABS
50.0000 mg | ORAL_TABLET | Freq: Four times a day (QID) | ORAL | Status: DC
  Administered 2020-03-19 (×2): 50 mg via ORAL
  Filled 2020-03-18 (×2): qty 1

## 2020-03-18 MED ORDER — LIDOCAINE 2% (20 MG/ML) 5 ML SYRINGE
INTRAMUSCULAR | Status: AC
  Filled 2020-03-18: qty 5

## 2020-03-18 MED ORDER — SUCCINYLCHOLINE CHLORIDE 20 MG/ML IJ SOLN
INTRAMUSCULAR | Status: DC | PRN
  Administered 2020-03-18: 100 mg via INTRAVENOUS

## 2020-03-18 MED ORDER — PROMETHAZINE HCL 25 MG/ML IJ SOLN: 6.2500 mg | INTRAMUSCULAR | Status: DC | PRN

## 2020-03-18 MED ORDER — DIPHENHYDRAMINE HCL 50 MG/ML IJ SOLN
INTRAMUSCULAR | Status: AC
  Filled 2020-03-18: qty 1

## 2020-03-18 MED ORDER — SUGAMMADEX SODIUM 200 MG/2ML IV SOLN
INTRAVENOUS | Status: DC | PRN
  Administered 2020-03-18: 136 mg via INTRAVENOUS

## 2020-03-18 MED ORDER — FENTANYL CITRATE (PF) 100 MCG/2ML IJ SOLN
INTRAMUSCULAR | Status: DC | PRN
  Administered 2020-03-18: 100 ug via INTRAVENOUS
  Administered 2020-03-18: 25 ug via INTRAVENOUS
  Administered 2020-03-18: 100 ug via INTRAVENOUS
  Administered 2020-03-18: 25 ug via INTRAVENOUS

## 2020-03-18 MED ORDER — FENTANYL CITRATE (PF) 250 MCG/5ML IJ SOLN
INTRAMUSCULAR | Status: AC
  Filled 2020-03-18: qty 5

## 2020-03-18 MED ORDER — BUPIVACAINE HCL (PF) 0.5 % IJ SOLN
INTRAMUSCULAR | Status: AC
  Filled 2020-03-18: qty 30

## 2020-03-18 MED ORDER — LIDOCAINE-EPINEPHRINE 1 %-1:100000 IJ SOLN
INTRAMUSCULAR | Status: AC
  Filled 2020-03-18: qty 1

## 2020-03-18 MED ORDER — METOPROLOL SUCCINATE ER 25 MG PO TB24
25.0000 mg | ORAL_TABLET | Freq: Every day | ORAL | Status: DC
  Administered 2020-03-19: 25 mg via ORAL
  Filled 2020-03-18: qty 1

## 2020-03-18 MED ORDER — OXYCODONE-ACETAMINOPHEN 5-325 MG PO TABS
1.0000 | ORAL_TABLET | ORAL | Status: DC | PRN
  Administered 2020-03-18: 1 via ORAL

## 2020-03-18 MED ORDER — SENNA 8.6 MG PO TABS
1.0000 | ORAL_TABLET | Freq: Two times a day (BID) | ORAL | Status: DC
  Administered 2020-03-19 (×2): 8.6 mg via ORAL
  Filled 2020-03-18 (×2): qty 1

## 2020-03-18 MED ORDER — BISACODYL 10 MG RE SUPP: 10.0000 mg | Freq: Every day | RECTAL | Status: DC | PRN

## 2020-03-18 MED ORDER — CHLORHEXIDINE GLUCONATE CLOTH 2 % EX PADS: 6.0000 | MEDICATED_PAD | Freq: Once | CUTANEOUS | Status: DC

## 2020-03-18 MED ORDER — VANCOMYCIN HCL IN DEXTROSE 1-5 GM/200ML-% IV SOLN
1000.0000 mg | INTRAVENOUS | Status: AC
  Administered 2020-03-18: 13:00:00 1000 mg via INTRAVENOUS
  Filled 2020-03-18: qty 200

## 2020-03-18 MED ORDER — ACETAMINOPHEN 325 MG PO TABS: 650.0000 mg | ORAL_TABLET | ORAL | Status: DC | PRN

## 2020-03-18 MED ORDER — LIDOCAINE-EPINEPHRINE 1 %-1:100000 IJ SOLN
INTRAMUSCULAR | Status: DC | PRN
  Administered 2020-03-18: 7.5 mL

## 2020-03-18 MED ORDER — PROPOFOL 10 MG/ML IV BOLUS
INTRAVENOUS | Status: DC | PRN
  Administered 2020-03-18: 100 mg via INTRAVENOUS

## 2020-03-18 MED ORDER — LOSARTAN POTASSIUM 50 MG PO TABS
100.0000 mg | ORAL_TABLET | Freq: Every day | ORAL | Status: DC
  Administered 2020-03-19: 100 mg via ORAL
  Filled 2020-03-18: qty 2

## 2020-03-18 MED ORDER — THROMBIN 5000 UNITS EX SOLR: OROMUCOSAL | Status: DC | PRN

## 2020-03-18 MED ORDER — ACETAMINOPHEN 500 MG PO TABS
1000.0000 mg | ORAL_TABLET | Freq: Four times a day (QID) | ORAL | Status: DC
  Administered 2020-03-19 (×3): 1000 mg via ORAL
  Filled 2020-03-18 (×3): qty 2

## 2020-03-18 MED ORDER — DOCUSATE SODIUM 100 MG PO CAPS
100.0000 mg | ORAL_CAPSULE | Freq: Two times a day (BID) | ORAL | Status: DC
  Administered 2020-03-19: 09:00:00 100 mg via ORAL
  Filled 2020-03-18: qty 1

## 2020-03-18 MED ORDER — BUPIVACAINE HCL (PF) 0.5 % IJ SOLN
INTRAMUSCULAR | Status: DC | PRN
  Administered 2020-03-18: 27.5 mL

## 2020-03-18 MED ORDER — CELECOXIB 200 MG PO CAPS
200.0000 mg | ORAL_CAPSULE | Freq: Once | ORAL | Status: AC
  Administered 2020-03-18: 13:00:00 200 mg via ORAL
  Filled 2020-03-18: qty 1

## 2020-03-18 MED ORDER — POLYETHYLENE GLYCOL 3350 17 G PO PACK: 17.0000 g | PACK | Freq: Every day | ORAL | Status: DC | PRN

## 2020-03-18 MED ORDER — FENTANYL CITRATE (PF) 100 MCG/2ML IJ SOLN
25.0000 ug | INTRAMUSCULAR | Status: DC | PRN
  Administered 2020-03-18: 19:00:00 25 ug via INTRAVENOUS

## 2020-03-18 MED ORDER — EPHEDRINE SULFATE 50 MG/ML IJ SOLN
INTRAMUSCULAR | Status: DC | PRN
  Administered 2020-03-18: 10 mg via INTRAVENOUS

## 2020-03-18 MED ORDER — ACETAMINOPHEN 500 MG PO TABS
1000.0000 mg | ORAL_TABLET | Freq: Once | ORAL | Status: AC
  Administered 2020-03-18: 13:00:00 1000 mg via ORAL
  Filled 2020-03-18: qty 2

## 2020-03-18 SURGICAL SUPPLY — 70 items
ADH SKN CLS APL DERMABOND .7 (GAUZE/BANDAGES/DRESSINGS) ×4
APL SKNCLS STERI-STRIP NONHPOA (GAUZE/BANDAGES/DRESSINGS)
BAG DECANTER FOR FLEXI CONT (MISCELLANEOUS) ×4 IMPLANT
BENZOIN TINCTURE PRP APPL 2/3 (GAUZE/BANDAGES/DRESSINGS) IMPLANT
BLADE CLIPPER SURG (BLADE) IMPLANT
BUR ACORN 6.0 (BURR) IMPLANT
BUR ACORN 6.0MM (BURR)
BUR MATCHSTICK NEURO 3.0 LAGG (BURR) ×4 IMPLANT
CABLE BIPOLOR RESECTION CORD (MISCELLANEOUS) IMPLANT
CANISTER SUCT 3000ML PPV (MISCELLANEOUS) ×4 IMPLANT
CARTRIDGE OIL MAESTRO DRILL (MISCELLANEOUS) ×2 IMPLANT
CLOSURE WOUND 1/2 X4 (GAUZE/BANDAGES/DRESSINGS)
COVER WAND RF STERILE (DRAPES) IMPLANT
DECANTER SPIKE VIAL GLASS SM (MISCELLANEOUS) ×4 IMPLANT
DERMABOND ADVANCED (GAUZE/BANDAGES/DRESSINGS) ×4
DERMABOND ADVANCED .7 DNX12 (GAUZE/BANDAGES/DRESSINGS) ×4 IMPLANT
DIFFUSER DRILL AIR PNEUMATIC (MISCELLANEOUS) ×4 IMPLANT
DRAPE HALF SHEET 40X57 (DRAPES) ×4 IMPLANT
DRAPE LAPAROTOMY 100X72X124 (DRAPES) ×4 IMPLANT
DRAPE MICROSCOPE LEICA (MISCELLANEOUS) ×4 IMPLANT
DURAPREP 26ML APPLICATOR (WOUND CARE) ×4 IMPLANT
ELECT REM PT RETURN 9FT ADLT (ELECTROSURGICAL) ×4
ELECTRODE REM PT RTRN 9FT ADLT (ELECTROSURGICAL) ×2 IMPLANT
GAUZE 4X4 16PLY RFD (DISPOSABLE) ×4 IMPLANT
GAUZE SPONGE 4X4 12PLY STRL (GAUZE/BANDAGES/DRESSINGS) IMPLANT
GLOVE BIO SURGEON STRL SZ7.5 (GLOVE) IMPLANT
GLOVE BIOGEL PI IND STRL 6.5 (GLOVE) ×2 IMPLANT
GLOVE BIOGEL PI IND STRL 7.5 (GLOVE) IMPLANT
GLOVE BIOGEL PI IND STRL 8.5 (GLOVE) ×2 IMPLANT
GLOVE BIOGEL PI INDICATOR 6.5 (GLOVE) ×2
GLOVE BIOGEL PI INDICATOR 7.5 (GLOVE)
GLOVE BIOGEL PI INDICATOR 8.5 (GLOVE) ×2
GLOVE ECLIPSE 8.5 STRL (GLOVE) ×4 IMPLANT
GLOVE EXAM NITRILE XL STR (GLOVE) IMPLANT
GLOVE SURG SS PI 6.5 STRL IVOR (GLOVE) ×8 IMPLANT
GOWN STRL REUS W/ TWL LRG LVL3 (GOWN DISPOSABLE) ×4 IMPLANT
GOWN STRL REUS W/ TWL XL LVL3 (GOWN DISPOSABLE) IMPLANT
GOWN STRL REUS W/TWL 2XL LVL3 (GOWN DISPOSABLE) ×4 IMPLANT
GOWN STRL REUS W/TWL LRG LVL3 (GOWN DISPOSABLE) ×8
GOWN STRL REUS W/TWL XL LVL3 (GOWN DISPOSABLE)
HEMOSTAT POWDER KIT SURGIFOAM (HEMOSTASIS) ×4 IMPLANT
KIT BASIN OR (CUSTOM PROCEDURE TRAY) ×4 IMPLANT
KIT TURNOVER KIT B (KITS) ×4 IMPLANT
MARKER SKIN DUAL TIP RULER LAB (MISCELLANEOUS) ×4 IMPLANT
NEEDLE HYPO 22GX1.5 SAFETY (NEEDLE) ×4 IMPLANT
NEEDLE SPNL 20GX3.5 QUINCKE YW (NEEDLE) ×4 IMPLANT
NS IRRIG 1000ML POUR BTL (IV SOLUTION) ×4 IMPLANT
OIL CARTRIDGE MAESTRO DRILL (MISCELLANEOUS) ×4
PACK LAMINECTOMY NEURO (CUSTOM PROCEDURE TRAY) ×4 IMPLANT
PAD ARMBOARD 7.5X6 YLW CONV (MISCELLANEOUS) ×12 IMPLANT
PATTIES SURGICAL .5 X1 (DISPOSABLE) ×4 IMPLANT
RUBBERBAND STERILE (MISCELLANEOUS) ×8 IMPLANT
SPONGE LAP 4X18 RFD (DISPOSABLE) IMPLANT
SPONGE SURGIFOAM ABS GEL SZ50 (HEMOSTASIS) IMPLANT
STAPLER SKIN PROX WIDE 3.9 (STAPLE) IMPLANT
STRIP CLOSURE SKIN 1/2X4 (GAUZE/BANDAGES/DRESSINGS) IMPLANT
SUT PROLENE 6 0 BV (SUTURE) ×4 IMPLANT
SUT VIC AB 0 CT1 18XCR BRD8 (SUTURE) IMPLANT
SUT VIC AB 0 CT1 8-18 (SUTURE)
SUT VIC AB 1 CT1 18XBRD ANBCTR (SUTURE) ×2 IMPLANT
SUT VIC AB 1 CT1 8-18 (SUTURE) ×4
SUT VIC AB 2-0 CP2 18 (SUTURE) ×4 IMPLANT
SUT VIC AB 3-0 SH 8-18 (SUTURE) ×8 IMPLANT
SUT VIC AB 4-0 RB1 18 (SUTURE) ×4 IMPLANT
SWAB COLLECTION DEVICE MRSA (MISCELLANEOUS) IMPLANT
SWAB CULTURE ESWAB REG 1ML (MISCELLANEOUS) IMPLANT
SYR CONTROL 10ML LL (SYRINGE) IMPLANT
TOWEL GREEN STERILE (TOWEL DISPOSABLE) ×4 IMPLANT
TOWEL GREEN STERILE FF (TOWEL DISPOSABLE) ×4 IMPLANT
WATER STERILE IRR 1000ML POUR (IV SOLUTION) ×4 IMPLANT

## 2020-03-18 NOTE — Anesthesia Procedure Notes (Signed)
Procedure Name: Intubation Date/Time: 03/18/2020 3:55 PM Performed by: Kathryne Hitch, CRNA Pre-anesthesia Checklist: Patient identified, Emergency Drugs available, Suction available and Patient being monitored Patient Re-evaluated:Patient Re-evaluated prior to induction Oxygen Delivery Method: Circle system utilized Preoxygenation: Pre-oxygenation with 100% oxygen Induction Type: IV induction Ventilation: Mask ventilation without difficulty Laryngoscope Size: Miller and 3 Grade View: Grade I Tube type: Oral Tube size: 7.5 mm Number of attempts: 1 Airway Equipment and Method: Stylet and Oral airway Placement Confirmation: ETT inserted through vocal cords under direct vision,  positive ETCO2 and breath sounds checked- equal and bilateral Secured at: 22 cm Tube secured with: Tape Dental Injury: Teeth and Oropharynx as per pre-operative assessment

## 2020-03-18 NOTE — Anesthesia Preprocedure Evaluation (Addendum)
Anesthesia Evaluation    Reviewed: Allergy & Precautions, Patient's Chart, lab work & pertinent test results  History of Anesthesia Complications Negative for: history of anesthetic complications  Airway Mallampati: I  TM Distance: >3 FB Neck ROM: Full    Dental   Pulmonary neg pulmonary ROS,    Pulmonary exam normal        Cardiovascular hypertension, Pt. on home beta blockers and Pt. on medications negative cardio ROS Normal cardiovascular exam     Neuro/Psych    GI/Hepatic Neg liver ROS, GERD  ,  Endo/Other  negative endocrine ROS  Renal/GU negative Renal ROS     Musculoskeletal negative musculoskeletal ROS (+)   Abdominal   Peds  Hematology negative hematology ROS (+)   Anesthesia Other Findings Day of surgery medications reviewed with the patient.  Reproductive/Obstetrics                            Anesthesia Physical Anesthesia Plan  ASA: III  Anesthesia Plan: General   Post-op Pain Management:    Induction: Intravenous  PONV Risk Score and Plan: 3 and Ondansetron, Dexamethasone and Diphenhydramine  Airway Management Planned: Oral ETT  Additional Equipment:   Intra-op Plan:   Post-operative Plan: Extubation in OR  Informed Consent:   Plan Discussed with: Anesthesiologist  Anesthesia Plan Comments:         Anesthesia Quick Evaluation

## 2020-03-18 NOTE — Op Note (Signed)
Date of surgery: 03/18/2020 Preoperative diagnosis: Lumbar spinal stenosis L3-4 L4-5 neurogenic claudication, lumbar radiculopathy.  Severe bilateral leg weakness, lesion in Terry buttock. Postoperative diagnosis: Same Procedure: 1.  Bilateral laminotomies and foraminotomies L3-4 and L4-5 decompression of the central canal and the lateral recesses including the L3-L4 and L5 nerve roots. 2.  Resection of subcutaneous mass from Terry buttock, 7 cm in length, circular by 3 cm in depth. Surgeon: Kristeen Miss Anesthesia: General endotracheal Indications: Terry Hernandez is an 86 year old individual who has had progressive weakness in his legs that he states happened in the last 6 months but talking to family members it seems that is been going on much longer than that.  He ultimately underwent an MRI of the lumbar spine which demonstrates that he has high-grade stenosis at L3-4 and L4-5.  Patient also has had a 40 pound weight loss and he notes that he has had a mass on his gluteal region on the Terry side for some 30 years is always told it was benign.  On examination it was noted that the mass had some structure within it even though it was freely ballotable and nonpainful.  I advised that the patient should have surgical excision of this mass.  Procedure: Patient was brought to the operating room supine on the stretcher.  After the smooth induction of general endotracheal anesthesia, he was carefully turned prone.  The back was prepped with alcohol DuraPrep and draped in a sterile fashion.  Then a linear incision was made vertically in the lumbar spine over the L3-4 and L4-5 levels subperiosteal tissues were dissected bluntly to expose for spinous processes and these were identified as L3 and L4 respectively on a localizing radiograph.  Then a subperiosteal dissection was performed out over the facet joints on either side and a self-retaining retractor was placed into the wound laminotomies were created L3 and L4  first on the Terry side starting at L3.  In the midline there was noted to be some CSF in the region and it was felt that this was coming from the opposite side which had not yet been decompressed careful dissection on the opposite side yielded a small pinhole in the surface of the dura which was likely the result of perhaps a previous epidural injection.  Nonetheless this was isolated and closed with a singular 6-0 Prolene suture laminotomies were then completed and care was taken to make sure that the L3 nerve roots were decompressed superiorly using a combination of 2 mm straight and curved rongeurs and curettes.  There was much thickened redundant yellow ligamentous material that was creating the bulk of the high-grade stenosis at L3-4 and L4-5 the condition was even worse similar decompression was undertaken here and in the end L4 and L5 were also decompressed using the same type stools once decompression was completed hemostasis in the wound was checked carefully and then the lumbodorsal fascia was closed with #1 Vicryl interrupted fashion 2-0 Vicryl was used in subcutaneous tissues and 3-0 Vicryl subcuticularly but not until 20 cc of half percent Marcaine was injected into the paraspinous fascia.  Once this was closed we turned our attention to the second part of the procedure.  The Terry buttock had been prepped and draped at the same time of the initial prep and a linear incision was made over the midportion of this mass measuring approximately 8 cm.  Dissection was carried down to the surface of the mass and the capsule was noted unfortunately the capsule ruptured  and thick sebaceous material was encountered.  Care was taken to leave the capsule intact as best possible and a careful dissection was performed around the entirety of the mass once or twice entering the mass inadvertently.  Ultimately the mass was noted to be on a pedicle that was attached to the skin in the lower lateral portion of the  incision.  Because it was firmly attached and I suspected this was a sebaceous cyst we excised the piece of the skin with the mass in total.  Then the skin tag was carefully trimmed to allow closure of an elliptical incision.  The subcutaneous tissues were checked for hemostasis and the wound was irrigated copiously from the debris that may have escaped from the mass.  In the end were able to close the subcutaneous tissue with 3-0 Vicryl in interrupted fashion and the subcuticular skin with 4-0 Vicryl.  Dermabond was placed on the skin on both incisions.  Blood loss for the entirety of the procedure was 200 cc.  Patient tolerated procedure well was returned to recovery room in stable condition.

## 2020-03-18 NOTE — Anesthesia Postprocedure Evaluation (Signed)
Anesthesia Post Note  Patient: Terry Hernandez  Procedure(s) Performed: Bilateral Lumbar three-four Lumbar four-five Laminotomy/foraminotomy (Bilateral Back) Excision of a Right gluteal subcutaneous mass (Right Buttocks)     Patient location during evaluation: PACU Anesthesia Type: General Level of consciousness: awake and alert Pain management: pain level controlled Vital Signs Assessment: post-procedure vital signs reviewed and stable Respiratory status: spontaneous breathing, nonlabored ventilation, respiratory function stable and patient connected to nasal cannula oxygen Cardiovascular status: blood pressure returned to baseline and stable Postop Assessment: no apparent nausea or vomiting Anesthetic complications: no    Last Vitals:  Vitals:   03/18/20 1915 03/18/20 2000  BP: 104/60 120/83  Pulse: 96 94  Resp: (!) 22 18  Temp:    SpO2: 99% 100%    Last Pain:  Vitals:   03/18/20 2000  TempSrc:   PainSc: 5                  Colie Josten DAVID

## 2020-03-18 NOTE — Transfer of Care (Signed)
Immediate Anesthesia Transfer of Care Note  Patient: Terry Hernandez  Procedure(s) Performed: Bilateral Lumbar three-four Lumbar four-five Laminotomy/foraminotomy (Bilateral Back) Excision of a Right gluteal subcutaneous mass (Right Buttocks)  Patient Location: PACU  Anesthesia Type:General  Level of Consciousness: drowsy and patient cooperative  Airway & Oxygen Therapy: Patient Spontanous Breathing and Patient connected to face mask oxygen  Post-op Assessment: Report given to RN and Post -op Vital signs reviewed and stable  Post vital signs: Reviewed and stable  Last Vitals:  Vitals Value Taken Time  BP 101/63 03/18/20 1813  Temp    Pulse 81 03/18/20 1814  Resp 15 03/18/20 1814  SpO2 99 % 03/18/20 1814  Vitals shown include unvalidated device data.  Last Pain:  Vitals:   03/18/20 1315  TempSrc:   PainSc: 6       Patients Stated Pain Goal: 3 ([id number])  Complications: No apparent anesthesia complications

## 2020-03-18 NOTE — H&P (Signed)
CHIEF COMPLAINT: Pain in the back, weakness in the legs, particularly on the left with footdrop.  HISTORY OF PRESENT ILLNESS: [name] is an 86 year old right-handed individual who tells me that he has had some back problems in the past.  In fact, I had seen him a number of years ago and noted that he had some spondylitic stenosis and suggested conservative management.  He had an epidural injection at that time and that did seem to help the problem considerably.  This was in early 2000s.  Nonetheless, he has recurred significant back pain here in the past year, but over the past couple of weeks he has noted his left leg has become weak. Because of this process, he underwent an MRI on March 22 and this study is reviewed in the office.  It demonstrates that [name] has some diffuse spondylosis starting at L1 down to L5, but the worst areas of notable stenosis are at L3-4 and L4-5, where he has nearly complete obliteration of his central spinal canal.  The worst condition is at L4-5.  There is a slight grade 1 anterolisthesis of L4 on L5 on the MRI, and both lateral recesses are severely stenotic. To further his workup today, I obtained a lateral flexion and extension film of the lumbar spine in addition to a singular AP view. The radiographs demonstrate that there are marked spondylitic changes with hypertrophy of the facet, but no abnormal motion between flexion and extension.  The slight 2 mm anterolisthesis is noted at L4 and L5, but this appears to be a fixed problem.   His past medical history reveals that his general health has been good.  He is followed by Dr. Consuello Hernandez.  CURRENT MEDICATIONS: Include Lipitor, Baby Aspirin daily, Losartan, Tramadol, Metoprolol, and a Multivitamin.  SYSTEMS REVIEW: Notable for some arthritis, leg weakness, back pain, leg pain, high cholesterol, difficulty with starting and stopping urinary stream.  He notes that he has had about a 40-pound weight loss  over the past year.  PHYSICAL EXAMINATION: Musculoskeletal: I note that [name] is walking with the use of a walker.  He does demonstrate a footdrop on that left side that is complete.  He also has some gluteal weakness, as he has some difficulty with lifting that left leg proximally.  His right leg appears to function well with good strength in the iliopsoas, quads, tibialis anterior, and the gastrocs.  He has absent reflexes in the patellae and the Achilles.  Straight leg raising is negative at 60 degrees bilaterally. Patrick maneuver is negative.   Overall, the patient appears to have severe spondylitic stenosis at L3-4 and L4-5.  I believe at this point he needs surgical decompression, particularly given the fact that this footdrop has fairly recently occurred. With the degree of high-grade stenosis as he has, I believe the only relief that he is going to see is if he gets this area decompressed.  The other concern I have is that it could affect his bowel and bladder control, and he already notes that he has a severe urgency for urination that has been worse lately.  We will plan on scheduling the surgical decompression via laminotomies bilaterally at L3-4 and L4-5 at the earliest convenience.  I discussed with him the major risks of the surgery.  Of course, he already has some nerve damage, and there is always the risk of more nerve damage, but the biggest concern I have is for the potential for spinal fluid leakage  and this could extend his hospital stay should it occur.  Nonetheless, given the weakness that he is experiencing, surgical decompression is indicated.

## 2020-03-19 MED ORDER — OXYCODONE-ACETAMINOPHEN 5-325 MG PO TABS: 1.0000 | ORAL_TABLET | ORAL | 0 refills | Status: DC | PRN

## 2020-03-19 MED ORDER — METHOCARBAMOL 500 MG PO TABS: 500.0000 mg | ORAL_TABLET | Freq: Four times a day (QID) | ORAL | 3 refills | Status: AC | PRN

## 2020-03-19 NOTE — Discharge Summary (Signed)
Physician Discharge Summary  Patient ID: Terry Hernandez MRN: [id number] DOB/AGE: 05/14/1934 86 y.o.  Admit date: 03/18/2020 Discharge date: 03/19/2020  Admission Diagnoses:Lumbar stenosis  Discharge Diagnoses: Lumbar Stenosis, mass right buttock Active Problems:   Lumbar stenosis with neurogenic claudication   Lumbar spinal stenosis   Discharged Condition: good  Hospital Course: Did fine  Consults: None  Significant Diagnostic Studies: none  Treatments: surgery: Laminectomy L3-4 L45-  Discharge Exam: Blood pressure 120/71, pulse 72, temperature 98.7 F (37.1 C), temperature source Oral, resp. rate 20, height 5' 10.5" (1.791 m), weight 68 kg, SpO2 99 %. incision clean dry gait improved  Disposition: Discharge disposition: 01-Home or Self Care       Discharge Instructions    Call MD for:  redness, tenderness, or signs of infection (pain, swelling, redness, odor or green/yellow discharge around incision site)   Complete by: As directed    Call MD for:  severe uncontrolled pain   Complete by: As directed    Call MD for:  temperature >100.4   Complete by: As directed    Diet - low sodium heart healthy   Complete by: As directed    Discharge instructions   Complete by: As directed    Okay to shower. Do not apply salves or appointments to incision. No heavy lifting with the upper extremities greater than 15 pounds. May resume driving when not requiring pain medication and patient feels comfortable with doing so.   Incentive spirometry RT   Complete by: As directed    Increase activity slowly   Complete by: As directed       Follow-up Information    [place] Follow up.   Why: The home health agency will contact you for the next visit. Contact information: [phone]          Signed: Earleen Newport 03/19/2020, 6:06 PM

## 2020-03-19 NOTE — Progress Notes (Signed)
Patient IV D/C without complication. Personal belongings collected from room. Patient and son verbalized understanding discharge instructions. Patient left unit via wheelchair to personal vehicle driven by son.

## 2020-03-19 NOTE — TOC Initial Note (Signed)
Transition of Care [facility]) - Initial/Assessment Note    Patient Details  Name: Terry Hernandez MRN: [id number] Date of Birth: 1934-09-28  [facility] CM/SW Contact:    Pollie Friar, RN Phone Number: 03/19/2020, 4:44 PM  Clinical Narrative:                 Pt is s/p lumbar surgery. Pt was set up with [facility] prior to admission through his PCP. Pt wants to continue with their services. Butch Penny with [facility] made aware of new orders.  TOC following for additional d/c needs.   Expected Discharge Plan: [place] Barriers to Discharge: Continued Medical Work up   Patient Goals and CMS Choice   CMS Medicare.gov Compare Post Acute Care list provided to:: Patient Choice offered to / list presented to : Patient  Expected Discharge Plan and Services Expected Discharge Plan: [place]   Discharge Planning Services: CM Consult Post Acute Care Choice: [place] arrangements for the past 2 months: Single Family Home                           HH Arranged: PT, OT, RN [facility] Agency: [place] [facility] Date HH Agency Contacted: 03/19/20   Representative spoke with at [place]: Butch Penny  Prior Living Arrangements/Services Living arrangements for the past 2 months: [place] Lives with:: Spouse Patient language and need for interpreter reviewed:: Yes Do you feel safe going back to the place where you live?: Yes      Need for Family Participation in Patient Care: Yes (Comment) Care giver support system in place?: Yes (comment)   Criminal Activity/Legal Involvement Pertinent to Current Situation/Hospitalization: No - Comment as needed  Activities of Daily Living Home Assistive Devices/Equipment: None ADL Screening (condition at time of admission) Patient's cognitive ability adequate to safely complete daily activities?: Yes Is the patient deaf or have difficulty hearing?: No Does the patient have difficulty seeing, even  when wearing glasses/contacts?: No Does the patient have difficulty concentrating, remembering, or making decisions?: No Patient able to express need for assistance with ADLs?: Yes Does the patient have difficulty dressing or bathing?: No Independently performs ADLs?: Yes (appropriate for developmental age) Does the patient have difficulty walking or climbing stairs?: No Weakness of Legs: None Weakness of Arms/Hands: None  Permission Sought/Granted                  Emotional Assessment Appearance:: Appears stated age Attitude/Demeanor/Rapport: Engaged Affect (typically observed): Accepting Orientation: : Oriented to Self, Oriented to Place, Oriented to  Time, Oriented to Situation   Psych Involvement: No (comment)  Admission diagnosis:  Lumbar stenosis with neurogenic claudication [M48.062] Lumbar spinal stenosis [M48.061] Patient Active Problem List   Diagnosis Date Noted  . Lumbar stenosis with neurogenic claudication 03/18/2020  . Lumbar spinal stenosis 03/18/2020  . PREMATURE ATRIAL CONTRACTIONS 09/04/2009  . PALPITATIONS 09/04/2009  . HYPERLIPIDEMIA 09/03/2009   PCP:  Manon Hilding, MD Pharmacy:   [facility] [place] [address] Phone: [phone] Fax: [phone]     Social Determinants of Health (SDOH) Interventions    Readmission Risk Interventions No flowsheet data found.

## 2020-03-19 NOTE — Progress Notes (Signed)
Occupational Therapy Evaluation  PTA, pt lived with his wife and was modified independent with mobility and required Min A with ADL due to apparent B shoulder  RTC insufficiency/arthritis and LE weakness. Pt with L foot drop and scissoring gait pattern. Son states his Dad has walked like this for @ 4 weeks. Spoke with son over the phone and recommended that his Dad have direct assistance with mobility and ADL due to his high risk of falls. Son verbalized understanding. Will attempt to see again prior to DC for family education to facilitate safe DC home with [place].     03/19/20 1400  OT Visit Information  Last OT Received On 03/19/20  Assistance Needed +1  History of Present Illness 86 yo male s/p bilateral L3-5 laminectomy/foraminotomy on 03/18/20. PMH includes HTN, HLD.  Precautions  Precautions Fall;Back  Home Living  Family/patient expects to be discharged to: Private residence  Living Arrangements Spouse/significant other  Available Help at Discharge Family;Available 24 hours/day  Type of Home House  Home Access Stairs to enter  Entrance Stairs-Number of Steps 2  Entrance Stairs-Rails Right  Home Layout One level  Bathroom Shower/Tub Tub/shower unit;Walk-in shower  Bathroom Toilet Handicapped height  Bathroom Accessibility Yes  How Accessible Accessible via walker  [place] - 2 wheels;Walker - 4 wheels;BSC;Crutches;Shower seat  Prior Function  Level of Independence Independent with assistive device(s)  Comments used rollator for ambulation, especially since developing L foot drop. Wife does not physically assist pt as back pain.  Communication  Communication No difficulties  Pain Assessment  Pain Assessment Faces  Faces Pain Scale 2  Pain Location back  Pain Descriptors / Indicators Sore;Discomfort  Pain Intervention(s) Limited activity within patient's tolerance  Cognition  Arousal/Alertness Awake/alert  Behavior During Therapy Impulsive  Overall Cognitive  Status No family/caregiver present to determine baseline cognitive functioning  Area of Impairment Attention;Memory;Safety/judgement;Awareness  Current Attention Level Selective  Memory Decreased recall of precautions;Decreased short-term memory  Following Commands Follows one step commands consistently  Safety/Judgement Decreased awareness of safety;Decreased awareness of deficits  Awareness Emergent  Problem Solving Slow processing;Requires verbal cues  Upper Extremity Assessment  Upper Extremity Assessment Generalized weakness;RUE deficits/detail;LUE deficits/detail  RUE Deficits / Details limited shoulder ROM to@ 30 FF at baseline due to apparent RTC insufficiency  LUE Deficits / Details LUE similar to R; arthritis both UE  Lower Extremity Assessment  Lower Extremity Assessment LLE deficits/detail  LLE Deficits / Details L foot drop with significant incoordination functionally  Cervical / Trunk Assessment  Cervical / Trunk Assessment Kyphotic (recent back surgery)  ADL  Overall ADL's  Needs assistance/impaired  Grooming Set up;Supervision/safety;Standing  Upper Body Bathing Minimal assistance;Sitting  Lower Body Bathing Moderate assistance;Sit to/from stand  Upper Body Dressing  Minimal assistance;Sitting  Lower Body Dressing Moderate assistance;Sit to/from Retail buyer Minimal assistance;Ambulation;RW  Toileting- Clothing Manipulation and Hygiene Minimal assistance;Sit to/from stand  Functional mobility during ADLs Minimal assistance;Rolling walker;Cueing for safety  General ADL Comments Educated pton back precautions for ADL. Pt has difficulty maintaining back precautions - requires frequent vc. "This is the way I was doing it". Explained that pt has to do tasks differently now dueto back precautions. High fall risk due to L foot drop adn unccoordinated movements of LLE>   Vision- History  Baseline Vision/History Wears glasses  Bed Mobility  Overal bed mobility Needs  Assistance  Rolling Supervision  Sidelying to sit Min guard  General bed mobility comments vc for correct tachnique  Transfers  Overall  transfer level Needs assistance  Equipment used Rolling walker (2 wheeled)  Transfers Sit to/from [facility]  Sit to Stand Min guard;From elevated surface  Stand pivot transfers Min assist  Balance  Overall balance assessment Needs assistance  Sitting-balance support Feet supported  Sitting balance-Leahy Scale Fair  Standing balance-Leahy Scale Poor  Standing balance comment reliant on external support. Unable to release RW and maintain balance  OT - End of Session  Equipment Utilized During Treatment Rolling walker  Activity Tolerance Patient tolerated treatment well  Patient left in chair;with call bell/phone within reach;with chair alarm set  Nurse Communication Mobility status;Other (comment) (DC recommendations)  OT Assessment  OT Recommendation/Assessment Patient needs continued OT Services  OT Visit Diagnosis Unsteadiness on feet (R26.81);Other abnormalities of gait and mobility (R26.89);Muscle weakness (generalized) (M62.81);Ataxia, unspecified (R27.0);Pain  Pain - part of body  (back)  OT Problem List Decreased strength;Decreased range of motion;Impaired balance (sitting and/or standing);Decreased coordination;Decreased safety awareness;Decreased knowledge of use of DME or AE;Decreased knowledge of precautions;Impaired tone;Impaired UE functional use;Pain  OT Plan  OT Frequency (ACUTE ONLY) Min 3X/week  OT Treatment/Interventions (ACUTE ONLY) Self-care/ADL training;Therapeutic exercise;Neuromuscular education;DME and/or AE instruction;Therapeutic activities;Patient/family education;Balance training  AM-PAC OT "6 Clicks" Daily Activity Outcome Measure (Version 2)  Help from another person eating meals? 4  Help from another person taking care of personal grooming? 3  Help from another person toileting, which includes using  toliet, bedpan, or urinal? 3  Help from another person bathing (including washing, rinsing, drying)? 2  Help from another person to put on and taking off regular upper body clothing? 3  Help from another person to put on and taking off regular lower body clothing? 2  6 Click Score 17  OT Recommendation  Follow Up Recommendations Home health OT;Supervision/Assistance - 24 hour (Direct assistance  for all ADL and mobility)  OT Equipment None recommended by OT  Individuals Consulted  Consulted and Agree with Results and Recommendations Patient;Family member/caregiver  Family Member Consulted son via phone  Acute Rehab OT Goals  Patient Stated Goal go home with wife  OT Goal Formulation With patient  Time For Goal Achievement 04/02/20  Potential to Achieve Goals Good  OT Time Calculation  OT Start Time (ACUTE ONLY) 1129  OT Stop Time (ACUTE ONLY) 1205  OT Time Calculation (min) 36 min  OT General Charges  $OT Visit 1 Visit  OT Evaluation  $OT Eval Moderate Complexity 1 Mod  OT Treatments  $Self Care/Home Management  8-22 mins  Written Expression  Dominant Hand Right  Maurie Boettcher, OT/L   Acute OT Clinical Specialist [place] Pager [phone] Office [phone]

## 2020-03-19 NOTE — Progress Notes (Signed)
Occupational Therapy Treatment Note  Session focused on family education regarding back precautions and strategies to reduce risks of falls. Toe lift strap placed around L shoe and appears to help clear L foot during mobility. Son educated on use of strap and verbalized understanding. Son able to safely ambulate with his Dad using RW and gait belt. Back precautions handout provided. Son verbalized understanding for need for direct assistance for mobility and ADL. Recommend HHOT.     03/19/20 1600  OT Visit Information  Last OT Received On 03/19/20  Assistance Needed +1  History of Present Illness 86 yo male s/p bilateral L3-5 laminectomy/foraminotomy on 03/18/20. PMH includes HTN, HLD.  Precautions  Precautions Fall;Back  Precaution Booklet Issued Yes (comment)  Pain Assessment  Pain Assessment No/denies pain  Cognition  Arousal/Alertness Awake/alert  Behavior During Therapy Impulsive  General Comments per son, at baseline  ADL  General ADL Comments toe lift strap added L footto reduce foot drag with good result Son/pt educated on donning/doffingstrap. Writen instructions provided.   Transfers  Overall transfer level Needs assistance  Transfers Sit to/from Stand;Stand Pivot Transfers  Sit to Stand Min assist  Stand pivot transfers Min assist  General transfer comment son educated on transfers  General Comments  General comments (skin integrity, edema, etc.) son educated on back precautions and mobility while adhereing to back precautions. Educate on fall precautions and how to safely ambulate with pt. Son able to safely ambulateusinggait belt.Pt with mild LOB during ambulation and son ableto recover - giving goo vc to his Dad. Pt has difficulty holding utensils due to arthiritis. Issued built up tubing to elp with sel feeding.  OT - End of Session  Equipment Utilized During Treatment Gait belt;Rolling walker  Activity Tolerance Patient tolerated treatment well  Patient left in  chair;with call bell/phone within reach;with family/visitor present  Nurse Communication Mobility status;Other (comment) (DC needs)  OT Assessment/Plan  OT Plan Discharge plan remains appropriate  OT Visit Diagnosis Unsteadiness on feet (R26.81);Other abnormalities of gait and mobility (R26.89);Muscle weakness (generalized) (M62.81);Ataxia, unspecified (R27.0);Pain  OT Frequency (ACUTE ONLY) Min 3X/week  Follow Up Recommendations Home health OT;Supervision/Assistance - 24 hour  OT Equipment None recommended by OT  AM-PAC OT "6 Clicks" Daily Activity Outcome Measure (Version 2)  Help from another person eating meals? 4  Help from another person taking care of personal grooming? 3  Help from another person toileting, which includes using toliet, bedpan, or urinal? 3  Help from another person bathing (including washing, rinsing, drying)? 2  Help from another person to put on and taking off regular upper body clothing? 3  Help from another person to put on and taking off regular lower body clothing? 2  6 Click Score 17  OT Goal Progression  Progress towards OT goals Progressing toward goals  Acute Rehab OT Goals  Patient Stated Goal go home with wife  OT Goal Formulation With patient  Time For Goal Achievement 04/02/20  Potential to Achieve Goals Good  ADL Goals  Pt Will Perform Lower Body Bathing with modified independence;sit to/from stand;with adaptive equipment  Pt Will Perform Lower Body Dressing with modified independence;sit to/from stand;with adaptive equipment  Pt Will Transfer to Toilet with modified independence;ambulating;bedside commode  Additional ADL Goal #1 Pt will independently verbalize 3 back precautions  Additional ADL Goal #2 Pt will verbalize 3 strategies to reduce risk of falls  OT Time Calculation  OT Start Time (ACUTE ONLY) 1555  OT Stop Time (ACUTE ONLY) 1630  OT  Time Calculation (min) 35 min  OT General Charges  $OT Visit 1 Visit  OT Treatments  $Self  Care/Home Management  23-37 mins  Maurie Boettcher, OT/L   Acute OT Clinical Specialist [place] Pager [phone] Office [phone]

## 2020-03-19 NOTE — Evaluation (Signed)
Physical Therapy Evaluation Patient Details Name: Terry Hernandez MRN: [id number] DOB: Apr 13, 1934 Today's Date: 03/19/2020   History of Present Illness  86 yo male s/p bilateral L3-5 laminectomy/foraminotomy on 03/18/20. PMH includes HTN, HLD.  Clinical Impression  Pt presents with LE weakness, L foot drop (pt states he is in the process of getting an AFO), incoordination during gait, decreased knowledge and application of spinal precautions, poor standing balance making pt HIGH FALL RISK, and decreased activity tolerance post-op. Pt to benefit from acute PT to address deficits. Pt ambulated hallway distance with use of RW and min assist for steadying, pt with multiple near-tripping moments due to narrow base of support, L foot drop catching on RLE, and scissoring-like gait. Per pt, his wife cannot assist him at home due to her own back issues, but states sons can be of assist as needed post-op. PT encouraged pt to have son with him when up and moving at all times, gait belt administered. PT also discussed pt's openness to post-acute rehab, pt not interested and states "I want to go home with HHPT". PT to progress mobility as tolerated, and will continue to follow acutely.      Follow Up Recommendations Home health PT;Supervision/Assistance - 24 hour    Equipment Recommendations  None recommended by PT    Recommendations for Other Services       Precautions / Restrictions Precautions Precautions: Fall;Back Precaution Booklet Issued: No Precaution Comments: verbally reviewed no bending, lifting, twisting, arching, and practiced log roll technique in and out of bed. OT to administer handout Restrictions Weight Bearing Restrictions: No      Mobility  Bed Mobility Overal bed mobility: Needs Assistance Bed Mobility: Rolling;Sidelying to Sit;Sit to Sidelying Rolling: Min guard Sidelying to sit: Min assist;HOB elevated     Sit to sidelying: Min guard;HOB elevated General bed mobility  comments: min guard for rolling for sequencing, safety. Min assist for sidelying to sit for trunk elevation and steadying. min guard for return to supine using log roll technique. Verbal cuing throughout to maintain back precautions.  Transfers Overall transfer level: Needs assistance Equipment used: Rolling walker (2 wheeled) Transfers: Sit to/from Stand Sit to Stand: Min guard;From elevated surface         General transfer comment: min guard for safety, verbal cuing for hand placement when rising, bending from hips and knees when rising/sitting.  Ambulation/Gait Ambulation/Gait assistance: Min assist Gait Distance (Feet): 200 Feet Assistive device: Rolling walker (2 wheeled) Gait Pattern/deviations: Step-through pattern;Decreased stride length;Narrow base of support;Drifts right/left;Decreased dorsiflexion - left;Decreased stance time - left;Trunk flexed Gait velocity: decr   General Gait Details: min assist to steady, max multimodal cuing for upright posture, placement in RW, widening BOS as pt with many near-scissoring and tripping moments, and increasing L foot clearance with compensatory hip and knee flexion. VERY unsteady  Stairs Stairs: Yes Stairs assistance: Min assist Stair Management: One rail Right;Step to pattern;Forwards Number of Stairs: 3 General stair comments: min assist to steady, verbal cuing for sequencing (ascending with RLE, descending with LLE leading), use of handrail and HHA on non-rail side.  Wheelchair Mobility    Modified Rankin (Stroke Patients Only)       Balance Overall balance assessment: Needs assistance Sitting-balance support: No upper extremity supported;Feet supported Sitting balance-Leahy Scale: Fair     Standing balance support: Bilateral upper extremity supported Standing balance-Leahy Scale: Poor Standing balance comment: reliant on external support in standing, and min assist from PT  Pertinent Vitals/Pain Pain Assessment: Faces Faces Pain Scale: Hurts a little bit Pain Location: back Pain Descriptors / Indicators: Sore;Discomfort Pain Intervention(s): Limited activity within patient's tolerance;Monitored during session;Repositioned    Home Living Family/patient expects to be discharged to:: Private residence Living Arrangements: Spouse/significant other Available Help at Discharge: Family Type of Home: House Home Access: Stairs to enter Entrance Stairs-Rails: Right Entrance Stairs-Number of Steps: 2 Home Layout: One level Home Equipment: Environmental consultant - 2 wheels;Walker - 4 wheels;Bedside commode;Crutches;Shower seat      Prior Function Level of Independence: Independent with assistive device(s)         Comments: used rollator for ambulation, especially since developing L foot drop. Wife does not physically assist pt as back pain.     Hand Dominance   Dominant Hand: Right    Extremity/Trunk Assessment   Upper Extremity Assessment Upper Extremity Assessment: Defer to OT evaluation    Lower Extremity Assessment Lower Extremity Assessment: Generalized weakness;LLE deficits/detail LLE Deficits / Details: L foot drop with significant incoordination functionally    Cervical / Trunk Assessment Cervical / Trunk Assessment: Kyphotic  Communication   Communication: No difficulties  Cognition Arousal/Alertness: Awake/alert Behavior During Therapy: WFL for tasks assessed/performed Overall Cognitive Status: No family/caregiver present to determine baseline cognitive functioning Area of Impairment: Attention;Following commands;Problem solving;Safety/judgement;Memory                   Current Attention Level: Sustained Memory: Decreased recall of precautions Following Commands: Follows one step commands consistently Safety/Judgement: Decreased awareness of safety;Decreased awareness of deficits   Problem Solving: Requires verbal cues;Requires  tactile cues General Comments: Pt tangential and at times difficult to re-direct. Is very pleasant, and is a self-proclaimed "talker". Requires multimodal cuing for safety and precautions      General Comments General comments (skin integrity, edema, etc.): HRmax 121 bpm during gait    Exercises     Assessment/Plan    PT Assessment Patient needs continued PT services  PT Problem List Decreased mobility;Decreased strength;Decreased safety awareness;Decreased knowledge of precautions;Decreased activity tolerance;Decreased balance;Decreased knowledge of use of DME;Cardiopulmonary status limiting activity;Decreased coordination       PT Treatment Interventions DME instruction;Therapeutic activities;Gait training;Therapeutic exercise;Patient/family education;Balance training;Stair training;Functional mobility training;Neuromuscular re-education    PT Goals (Current goals can be found in the Care Plan section)  Acute Rehab PT Goals Patient Stated Goal: go home with wife PT Goal Formulation: With patient Time For Goal Achievement: 04/02/20 Potential to Achieve Goals: Good    Frequency Min 5X/week   Barriers to discharge        Co-evaluation               AM-PAC PT "6 Clicks" Mobility  Outcome Measure Help needed turning from your back to your side while in a flat bed without using bedrails?: A Little Help needed moving from lying on your back to sitting on the side of a flat bed without using bedrails?: A Little Help needed moving to and from a bed to a chair (including a wheelchair)?: A Little Help needed standing up from a chair using your arms (e.g., wheelchair or bedside chair)?: A Little Help needed to walk in hospital room?: A Little Help needed climbing 3-5 steps with a railing? : A Little 6 Click Score: 18    End of Session Equipment Utilized During Treatment: Gait belt Activity Tolerance: Patient tolerated treatment well;Patient limited by fatigue Patient left:  in bed;with call bell/phone within reach;with bed alarm set Nurse Communication: Mobility status PT  Visit Diagnosis: Unsteadiness on feet (R26.81);Other abnormalities of gait and mobility (R26.89);Muscle weakness (generalized) (M62.81)    Time: [id number] PT Time Calculation (min) (ACUTE ONLY): 41 min   Charges:   PT Evaluation $PT Eval Low Complexity: 1 Low PT Treatments $Gait Training: 8-22 mins $Therapeutic Activity: 8-22 mins        Clarie Camey E, PT Acute Rehabilitation Services Pager [phone]  Office [phone]   Callee Rohrig D Breeanna Galgano 03/19/2020, 1:29 PM

## 2020-03-20 LAB — TYPE AND SCREEN
ABO/RH(D): O POS
Antibody Screen: POSITIVE
Unit division: 0
Unit division: 0

## 2020-03-20 LAB — BPAM RBC
Blood Product Expiration Date: 202105042359
Blood Product Expiration Date: 202105052359
ISSUE DATE / TIME: 202104020708
Unit Type and Rh: 5100
Unit Type and Rh: 5100

## 2020-03-20 LAB — SURGICAL PATHOLOGY

## 2020-03-21 DIAGNOSIS — M545 Low back pain: Secondary | ICD-10-CM | POA: Diagnosis not present

## 2020-03-21 DIAGNOSIS — I10 Essential (primary) hypertension: Secondary | ICD-10-CM | POA: Diagnosis not present

## 2020-03-21 DIAGNOSIS — L89312 Pressure ulcer of right buttock, stage 2: Secondary | ICD-10-CM | POA: Diagnosis not present

## 2020-03-21 DIAGNOSIS — Z4800 Encounter for change or removal of nonsurgical wound dressing: Secondary | ICD-10-CM | POA: Diagnosis not present

## 2020-03-21 DIAGNOSIS — Z9181 History of falling: Secondary | ICD-10-CM | POA: Diagnosis not present

## 2020-03-21 DIAGNOSIS — M6281 Muscle weakness (generalized): Secondary | ICD-10-CM | POA: Diagnosis not present

## 2020-03-21 DIAGNOSIS — Z79891 Long term (current) use of opiate analgesic: Secondary | ICD-10-CM | POA: Diagnosis not present

## 2020-03-21 DIAGNOSIS — Z7982 Long term (current) use of aspirin: Secondary | ICD-10-CM | POA: Diagnosis not present

## 2020-03-22 DIAGNOSIS — I10 Essential (primary) hypertension: Secondary | ICD-10-CM | POA: Diagnosis not present

## 2020-03-22 DIAGNOSIS — M6281 Muscle weakness (generalized): Secondary | ICD-10-CM | POA: Diagnosis not present

## 2020-03-22 DIAGNOSIS — Z9181 History of falling: Secondary | ICD-10-CM | POA: Diagnosis not present

## 2020-03-22 DIAGNOSIS — Z4800 Encounter for change or removal of nonsurgical wound dressing: Secondary | ICD-10-CM | POA: Diagnosis not present

## 2020-03-22 DIAGNOSIS — L89312 Pressure ulcer of right buttock, stage 2: Secondary | ICD-10-CM | POA: Diagnosis not present

## 2020-03-22 DIAGNOSIS — M545 Low back pain: Secondary | ICD-10-CM | POA: Diagnosis not present

## 2020-03-22 DIAGNOSIS — Z7982 Long term (current) use of aspirin: Secondary | ICD-10-CM | POA: Diagnosis not present

## 2020-03-22 DIAGNOSIS — Z79891 Long term (current) use of opiate analgesic: Secondary | ICD-10-CM | POA: Diagnosis not present

## 2020-03-23 DIAGNOSIS — M6281 Muscle weakness (generalized): Secondary | ICD-10-CM | POA: Diagnosis not present

## 2020-03-23 DIAGNOSIS — M545 Low back pain: Secondary | ICD-10-CM | POA: Diagnosis not present

## 2020-03-23 DIAGNOSIS — Z4800 Encounter for change or removal of nonsurgical wound dressing: Secondary | ICD-10-CM | POA: Diagnosis not present

## 2020-03-23 DIAGNOSIS — I10 Essential (primary) hypertension: Secondary | ICD-10-CM | POA: Diagnosis not present

## 2020-03-23 DIAGNOSIS — Z7982 Long term (current) use of aspirin: Secondary | ICD-10-CM | POA: Diagnosis not present

## 2020-03-23 DIAGNOSIS — Z9181 History of falling: Secondary | ICD-10-CM | POA: Diagnosis not present

## 2020-03-23 DIAGNOSIS — L89312 Pressure ulcer of right buttock, stage 2: Secondary | ICD-10-CM | POA: Diagnosis not present

## 2020-03-23 DIAGNOSIS — Z79891 Long term (current) use of opiate analgesic: Secondary | ICD-10-CM | POA: Diagnosis not present

## 2020-03-26 DIAGNOSIS — L89312 Pressure ulcer of right buttock, stage 2: Secondary | ICD-10-CM | POA: Diagnosis not present

## 2020-03-26 DIAGNOSIS — Z79891 Long term (current) use of opiate analgesic: Secondary | ICD-10-CM | POA: Diagnosis not present

## 2020-03-26 DIAGNOSIS — M545 Low back pain: Secondary | ICD-10-CM | POA: Diagnosis not present

## 2020-03-26 DIAGNOSIS — I10 Essential (primary) hypertension: Secondary | ICD-10-CM | POA: Diagnosis not present

## 2020-03-26 DIAGNOSIS — M6281 Muscle weakness (generalized): Secondary | ICD-10-CM | POA: Diagnosis not present

## 2020-03-26 DIAGNOSIS — Z4800 Encounter for change or removal of nonsurgical wound dressing: Secondary | ICD-10-CM | POA: Diagnosis not present

## 2020-03-26 DIAGNOSIS — Z9181 History of falling: Secondary | ICD-10-CM | POA: Diagnosis not present

## 2020-03-26 DIAGNOSIS — Z7982 Long term (current) use of aspirin: Secondary | ICD-10-CM | POA: Diagnosis not present

## 2020-03-27 DIAGNOSIS — Z7982 Long term (current) use of aspirin: Secondary | ICD-10-CM | POA: Diagnosis not present

## 2020-03-27 DIAGNOSIS — Z9181 History of falling: Secondary | ICD-10-CM | POA: Diagnosis not present

## 2020-03-27 DIAGNOSIS — Z4800 Encounter for change or removal of nonsurgical wound dressing: Secondary | ICD-10-CM | POA: Diagnosis not present

## 2020-03-27 DIAGNOSIS — Z79891 Long term (current) use of opiate analgesic: Secondary | ICD-10-CM | POA: Diagnosis not present

## 2020-03-27 DIAGNOSIS — M6281 Muscle weakness (generalized): Secondary | ICD-10-CM | POA: Diagnosis not present

## 2020-03-27 DIAGNOSIS — L89312 Pressure ulcer of right buttock, stage 2: Secondary | ICD-10-CM | POA: Diagnosis not present

## 2020-03-27 DIAGNOSIS — I10 Essential (primary) hypertension: Secondary | ICD-10-CM | POA: Diagnosis not present

## 2020-03-27 DIAGNOSIS — M545 Low back pain: Secondary | ICD-10-CM | POA: Diagnosis not present

## 2020-03-28 DIAGNOSIS — Z9181 History of falling: Secondary | ICD-10-CM | POA: Diagnosis not present

## 2020-03-28 DIAGNOSIS — M545 Low back pain: Secondary | ICD-10-CM | POA: Diagnosis not present

## 2020-03-28 DIAGNOSIS — Z79891 Long term (current) use of opiate analgesic: Secondary | ICD-10-CM | POA: Diagnosis not present

## 2020-03-28 DIAGNOSIS — Z4800 Encounter for change or removal of nonsurgical wound dressing: Secondary | ICD-10-CM | POA: Diagnosis not present

## 2020-03-28 DIAGNOSIS — I10 Essential (primary) hypertension: Secondary | ICD-10-CM | POA: Diagnosis not present

## 2020-03-28 DIAGNOSIS — L89312 Pressure ulcer of right buttock, stage 2: Secondary | ICD-10-CM | POA: Diagnosis not present

## 2020-03-28 DIAGNOSIS — Z7982 Long term (current) use of aspirin: Secondary | ICD-10-CM | POA: Diagnosis not present

## 2020-03-28 DIAGNOSIS — M6281 Muscle weakness (generalized): Secondary | ICD-10-CM | POA: Diagnosis not present

## 2020-03-29 DIAGNOSIS — Z7982 Long term (current) use of aspirin: Secondary | ICD-10-CM | POA: Diagnosis not present

## 2020-03-29 DIAGNOSIS — Z9181 History of falling: Secondary | ICD-10-CM | POA: Diagnosis not present

## 2020-03-29 DIAGNOSIS — I10 Essential (primary) hypertension: Secondary | ICD-10-CM | POA: Diagnosis not present

## 2020-03-29 DIAGNOSIS — Z4800 Encounter for change or removal of nonsurgical wound dressing: Secondary | ICD-10-CM | POA: Diagnosis not present

## 2020-03-29 DIAGNOSIS — M6281 Muscle weakness (generalized): Secondary | ICD-10-CM | POA: Diagnosis not present

## 2020-03-29 DIAGNOSIS — M545 Low back pain: Secondary | ICD-10-CM | POA: Diagnosis not present

## 2020-03-29 DIAGNOSIS — Z79891 Long term (current) use of opiate analgesic: Secondary | ICD-10-CM | POA: Diagnosis not present

## 2020-03-29 DIAGNOSIS — L89312 Pressure ulcer of right buttock, stage 2: Secondary | ICD-10-CM | POA: Diagnosis not present

## 2020-04-01 DIAGNOSIS — M545 Low back pain: Secondary | ICD-10-CM | POA: Diagnosis not present

## 2020-04-01 DIAGNOSIS — Z4800 Encounter for change or removal of nonsurgical wound dressing: Secondary | ICD-10-CM | POA: Diagnosis not present

## 2020-04-01 DIAGNOSIS — Z7982 Long term (current) use of aspirin: Secondary | ICD-10-CM | POA: Diagnosis not present

## 2020-04-01 DIAGNOSIS — Z79891 Long term (current) use of opiate analgesic: Secondary | ICD-10-CM | POA: Diagnosis not present

## 2020-04-01 DIAGNOSIS — I10 Essential (primary) hypertension: Secondary | ICD-10-CM | POA: Diagnosis not present

## 2020-04-01 DIAGNOSIS — M6281 Muscle weakness (generalized): Secondary | ICD-10-CM | POA: Diagnosis not present

## 2020-04-01 DIAGNOSIS — L89312 Pressure ulcer of right buttock, stage 2: Secondary | ICD-10-CM | POA: Diagnosis not present

## 2020-04-01 DIAGNOSIS — Z9181 History of falling: Secondary | ICD-10-CM | POA: Diagnosis not present

## 2020-04-02 DIAGNOSIS — Z7982 Long term (current) use of aspirin: Secondary | ICD-10-CM | POA: Diagnosis not present

## 2020-04-02 DIAGNOSIS — I10 Essential (primary) hypertension: Secondary | ICD-10-CM | POA: Diagnosis not present

## 2020-04-02 DIAGNOSIS — M545 Low back pain: Secondary | ICD-10-CM | POA: Diagnosis not present

## 2020-04-02 DIAGNOSIS — Z9181 History of falling: Secondary | ICD-10-CM | POA: Diagnosis not present

## 2020-04-02 DIAGNOSIS — Z79891 Long term (current) use of opiate analgesic: Secondary | ICD-10-CM | POA: Diagnosis not present

## 2020-04-02 DIAGNOSIS — L89312 Pressure ulcer of right buttock, stage 2: Secondary | ICD-10-CM | POA: Diagnosis not present

## 2020-04-02 DIAGNOSIS — M6281 Muscle weakness (generalized): Secondary | ICD-10-CM | POA: Diagnosis not present

## 2020-04-02 DIAGNOSIS — Z4800 Encounter for change or removal of nonsurgical wound dressing: Secondary | ICD-10-CM | POA: Diagnosis not present

## 2020-04-04 DIAGNOSIS — M6281 Muscle weakness (generalized): Secondary | ICD-10-CM | POA: Diagnosis not present

## 2020-04-04 DIAGNOSIS — Z9181 History of falling: Secondary | ICD-10-CM | POA: Diagnosis not present

## 2020-04-04 DIAGNOSIS — Z79891 Long term (current) use of opiate analgesic: Secondary | ICD-10-CM | POA: Diagnosis not present

## 2020-04-04 DIAGNOSIS — M545 Low back pain: Secondary | ICD-10-CM | POA: Diagnosis not present

## 2020-04-04 DIAGNOSIS — L89312 Pressure ulcer of right buttock, stage 2: Secondary | ICD-10-CM | POA: Diagnosis not present

## 2020-04-04 DIAGNOSIS — Z7982 Long term (current) use of aspirin: Secondary | ICD-10-CM | POA: Diagnosis not present

## 2020-04-04 DIAGNOSIS — Z4800 Encounter for change or removal of nonsurgical wound dressing: Secondary | ICD-10-CM | POA: Diagnosis not present

## 2020-04-04 DIAGNOSIS — I10 Essential (primary) hypertension: Secondary | ICD-10-CM | POA: Diagnosis not present

## 2020-04-05 DIAGNOSIS — I10 Essential (primary) hypertension: Secondary | ICD-10-CM | POA: Diagnosis not present

## 2020-04-05 DIAGNOSIS — Z9181 History of falling: Secondary | ICD-10-CM | POA: Diagnosis not present

## 2020-04-05 DIAGNOSIS — Z7982 Long term (current) use of aspirin: Secondary | ICD-10-CM | POA: Diagnosis not present

## 2020-04-05 DIAGNOSIS — L89312 Pressure ulcer of right buttock, stage 2: Secondary | ICD-10-CM | POA: Diagnosis not present

## 2020-04-05 DIAGNOSIS — M545 Low back pain: Secondary | ICD-10-CM | POA: Diagnosis not present

## 2020-04-05 DIAGNOSIS — Z4800 Encounter for change or removal of nonsurgical wound dressing: Secondary | ICD-10-CM | POA: Diagnosis not present

## 2020-04-05 DIAGNOSIS — M6281 Muscle weakness (generalized): Secondary | ICD-10-CM | POA: Diagnosis not present

## 2020-04-05 DIAGNOSIS — Z79891 Long term (current) use of opiate analgesic: Secondary | ICD-10-CM | POA: Diagnosis not present

## 2020-04-08 DIAGNOSIS — Z79891 Long term (current) use of opiate analgesic: Secondary | ICD-10-CM | POA: Diagnosis not present

## 2020-04-08 DIAGNOSIS — Z4800 Encounter for change or removal of nonsurgical wound dressing: Secondary | ICD-10-CM | POA: Diagnosis not present

## 2020-04-08 DIAGNOSIS — M6281 Muscle weakness (generalized): Secondary | ICD-10-CM | POA: Diagnosis not present

## 2020-04-08 DIAGNOSIS — M545 Low back pain: Secondary | ICD-10-CM | POA: Diagnosis not present

## 2020-04-08 DIAGNOSIS — I10 Essential (primary) hypertension: Secondary | ICD-10-CM | POA: Diagnosis not present

## 2020-04-08 DIAGNOSIS — L89312 Pressure ulcer of right buttock, stage 2: Secondary | ICD-10-CM | POA: Diagnosis not present

## 2020-04-08 DIAGNOSIS — Z9181 History of falling: Secondary | ICD-10-CM | POA: Diagnosis not present

## 2020-04-08 DIAGNOSIS — Z7982 Long term (current) use of aspirin: Secondary | ICD-10-CM | POA: Diagnosis not present

## 2020-04-09 DIAGNOSIS — Z9181 History of falling: Secondary | ICD-10-CM | POA: Diagnosis not present

## 2020-04-09 DIAGNOSIS — Z79891 Long term (current) use of opiate analgesic: Secondary | ICD-10-CM | POA: Diagnosis not present

## 2020-04-09 DIAGNOSIS — M6281 Muscle weakness (generalized): Secondary | ICD-10-CM | POA: Diagnosis not present

## 2020-04-09 DIAGNOSIS — L89312 Pressure ulcer of right buttock, stage 2: Secondary | ICD-10-CM | POA: Diagnosis not present

## 2020-04-09 DIAGNOSIS — Z4800 Encounter for change or removal of nonsurgical wound dressing: Secondary | ICD-10-CM | POA: Diagnosis not present

## 2020-04-09 DIAGNOSIS — M545 Low back pain: Secondary | ICD-10-CM | POA: Diagnosis not present

## 2020-04-09 DIAGNOSIS — I10 Essential (primary) hypertension: Secondary | ICD-10-CM | POA: Diagnosis not present

## 2020-04-09 DIAGNOSIS — Z7982 Long term (current) use of aspirin: Secondary | ICD-10-CM | POA: Diagnosis not present

## 2020-04-11 DIAGNOSIS — Z7982 Long term (current) use of aspirin: Secondary | ICD-10-CM | POA: Diagnosis not present

## 2020-04-11 DIAGNOSIS — Z4800 Encounter for change or removal of nonsurgical wound dressing: Secondary | ICD-10-CM | POA: Diagnosis not present

## 2020-04-11 DIAGNOSIS — Z9181 History of falling: Secondary | ICD-10-CM | POA: Diagnosis not present

## 2020-04-11 DIAGNOSIS — Z79891 Long term (current) use of opiate analgesic: Secondary | ICD-10-CM | POA: Diagnosis not present

## 2020-04-11 DIAGNOSIS — I10 Essential (primary) hypertension: Secondary | ICD-10-CM | POA: Diagnosis not present

## 2020-04-11 DIAGNOSIS — M6281 Muscle weakness (generalized): Secondary | ICD-10-CM | POA: Diagnosis not present

## 2020-04-11 DIAGNOSIS — M545 Low back pain: Secondary | ICD-10-CM | POA: Diagnosis not present

## 2020-04-11 DIAGNOSIS — L89312 Pressure ulcer of right buttock, stage 2: Secondary | ICD-10-CM | POA: Diagnosis not present

## 2020-04-12 DIAGNOSIS — Z4800 Encounter for change or removal of nonsurgical wound dressing: Secondary | ICD-10-CM | POA: Diagnosis not present

## 2020-04-12 DIAGNOSIS — M545 Low back pain: Secondary | ICD-10-CM | POA: Diagnosis not present

## 2020-04-12 DIAGNOSIS — Z7982 Long term (current) use of aspirin: Secondary | ICD-10-CM | POA: Diagnosis not present

## 2020-04-12 DIAGNOSIS — Z79891 Long term (current) use of opiate analgesic: Secondary | ICD-10-CM | POA: Diagnosis not present

## 2020-04-12 DIAGNOSIS — L89312 Pressure ulcer of right buttock, stage 2: Secondary | ICD-10-CM | POA: Diagnosis not present

## 2020-04-12 DIAGNOSIS — Z9181 History of falling: Secondary | ICD-10-CM | POA: Diagnosis not present

## 2020-04-12 DIAGNOSIS — I10 Essential (primary) hypertension: Secondary | ICD-10-CM | POA: Diagnosis not present

## 2020-04-12 DIAGNOSIS — M6281 Muscle weakness (generalized): Secondary | ICD-10-CM | POA: Diagnosis not present

## 2020-04-15 DIAGNOSIS — I10 Essential (primary) hypertension: Secondary | ICD-10-CM | POA: Diagnosis not present

## 2020-04-15 DIAGNOSIS — Z79891 Long term (current) use of opiate analgesic: Secondary | ICD-10-CM | POA: Diagnosis not present

## 2020-04-15 DIAGNOSIS — M545 Low back pain: Secondary | ICD-10-CM | POA: Diagnosis not present

## 2020-04-15 DIAGNOSIS — Z9181 History of falling: Secondary | ICD-10-CM | POA: Diagnosis not present

## 2020-04-15 DIAGNOSIS — M6281 Muscle weakness (generalized): Secondary | ICD-10-CM | POA: Diagnosis not present

## 2020-04-15 DIAGNOSIS — L89312 Pressure ulcer of right buttock, stage 2: Secondary | ICD-10-CM | POA: Diagnosis not present

## 2020-04-15 DIAGNOSIS — Z7982 Long term (current) use of aspirin: Secondary | ICD-10-CM | POA: Diagnosis not present

## 2020-04-15 DIAGNOSIS — Z4800 Encounter for change or removal of nonsurgical wound dressing: Secondary | ICD-10-CM | POA: Diagnosis not present

## 2020-04-16 DIAGNOSIS — K56609 Unspecified intestinal obstruction, unspecified as to partial versus complete obstruction: Secondary | ICD-10-CM | POA: Diagnosis not present

## 2020-04-16 DIAGNOSIS — N1330 Unspecified hydronephrosis: Secondary | ICD-10-CM | POA: Diagnosis not present

## 2020-04-16 DIAGNOSIS — Z79899 Other long term (current) drug therapy: Secondary | ICD-10-CM | POA: Diagnosis not present

## 2020-04-16 DIAGNOSIS — Z20822 Contact with and (suspected) exposure to covid-19: Secondary | ICD-10-CM | POA: Diagnosis not present

## 2020-04-16 DIAGNOSIS — I959 Hypotension, unspecified: Secondary | ICD-10-CM | POA: Diagnosis not present

## 2020-04-16 DIAGNOSIS — Z9889 Other specified postprocedural states: Secondary | ICD-10-CM | POA: Diagnosis not present

## 2020-04-16 DIAGNOSIS — Z743 Need for continuous supervision: Secondary | ICD-10-CM | POA: Diagnosis not present

## 2020-04-16 DIAGNOSIS — E119 Type 2 diabetes mellitus without complications: Secondary | ICD-10-CM | POA: Diagnosis not present

## 2020-04-16 DIAGNOSIS — R195 Other fecal abnormalities: Secondary | ICD-10-CM | POA: Diagnosis not present

## 2020-04-16 DIAGNOSIS — R52 Pain, unspecified: Secondary | ICD-10-CM | POA: Diagnosis not present

## 2020-04-16 DIAGNOSIS — K2980 Duodenitis without bleeding: Secondary | ICD-10-CM | POA: Diagnosis not present

## 2020-04-16 DIAGNOSIS — E039 Hypothyroidism, unspecified: Secondary | ICD-10-CM | POA: Diagnosis not present

## 2020-04-16 DIAGNOSIS — R000 Tachycardia, unspecified: Secondary | ICD-10-CM | POA: Diagnosis not present

## 2020-04-16 DIAGNOSIS — M1990 Unspecified osteoarthritis, unspecified site: Secondary | ICD-10-CM | POA: Diagnosis not present

## 2020-04-16 DIAGNOSIS — I10 Essential (primary) hypertension: Secondary | ICD-10-CM | POA: Diagnosis not present

## 2020-04-16 DIAGNOSIS — K5900 Constipation, unspecified: Secondary | ICD-10-CM | POA: Diagnosis not present

## 2020-04-16 DIAGNOSIS — Z881 Allergy status to other antibiotic agents status: Secondary | ICD-10-CM | POA: Diagnosis not present

## 2020-04-18 DIAGNOSIS — M6281 Muscle weakness (generalized): Secondary | ICD-10-CM | POA: Diagnosis not present

## 2020-04-18 DIAGNOSIS — M545 Low back pain: Secondary | ICD-10-CM | POA: Diagnosis not present

## 2020-04-18 DIAGNOSIS — Z4800 Encounter for change or removal of nonsurgical wound dressing: Secondary | ICD-10-CM | POA: Diagnosis not present

## 2020-04-18 DIAGNOSIS — Z9181 History of falling: Secondary | ICD-10-CM | POA: Diagnosis not present

## 2020-04-18 DIAGNOSIS — Z79891 Long term (current) use of opiate analgesic: Secondary | ICD-10-CM | POA: Diagnosis not present

## 2020-04-18 DIAGNOSIS — L89312 Pressure ulcer of right buttock, stage 2: Secondary | ICD-10-CM | POA: Diagnosis not present

## 2020-04-18 DIAGNOSIS — Z7982 Long term (current) use of aspirin: Secondary | ICD-10-CM | POA: Diagnosis not present

## 2020-04-18 DIAGNOSIS — I10 Essential (primary) hypertension: Secondary | ICD-10-CM | POA: Diagnosis not present

## 2020-04-23 DIAGNOSIS — I10 Essential (primary) hypertension: Secondary | ICD-10-CM | POA: Diagnosis not present

## 2020-04-23 DIAGNOSIS — Z4800 Encounter for change or removal of nonsurgical wound dressing: Secondary | ICD-10-CM | POA: Diagnosis not present

## 2020-04-23 DIAGNOSIS — Z7982 Long term (current) use of aspirin: Secondary | ICD-10-CM | POA: Diagnosis not present

## 2020-04-23 DIAGNOSIS — Z9181 History of falling: Secondary | ICD-10-CM | POA: Diagnosis not present

## 2020-04-23 DIAGNOSIS — M6281 Muscle weakness (generalized): Secondary | ICD-10-CM | POA: Diagnosis not present

## 2020-04-23 DIAGNOSIS — Z79891 Long term (current) use of opiate analgesic: Secondary | ICD-10-CM | POA: Diagnosis not present

## 2020-04-23 DIAGNOSIS — M545 Low back pain: Secondary | ICD-10-CM | POA: Diagnosis not present

## 2020-04-23 DIAGNOSIS — L89312 Pressure ulcer of right buttock, stage 2: Secondary | ICD-10-CM | POA: Diagnosis not present

## 2020-04-26 DIAGNOSIS — Z7982 Long term (current) use of aspirin: Secondary | ICD-10-CM | POA: Diagnosis not present

## 2020-04-26 DIAGNOSIS — I10 Essential (primary) hypertension: Secondary | ICD-10-CM | POA: Diagnosis not present

## 2020-04-26 DIAGNOSIS — M545 Low back pain: Secondary | ICD-10-CM | POA: Diagnosis not present

## 2020-04-26 DIAGNOSIS — L89312 Pressure ulcer of right buttock, stage 2: Secondary | ICD-10-CM | POA: Diagnosis not present

## 2020-04-26 DIAGNOSIS — M6281 Muscle weakness (generalized): Secondary | ICD-10-CM | POA: Diagnosis not present

## 2020-04-26 DIAGNOSIS — Z4800 Encounter for change or removal of nonsurgical wound dressing: Secondary | ICD-10-CM | POA: Diagnosis not present

## 2020-04-26 DIAGNOSIS — Z9181 History of falling: Secondary | ICD-10-CM | POA: Diagnosis not present

## 2020-04-26 DIAGNOSIS — Z79891 Long term (current) use of opiate analgesic: Secondary | ICD-10-CM | POA: Diagnosis not present

## 2020-05-10 DIAGNOSIS — M48062 Spinal stenosis, lumbar region with neurogenic claudication: Secondary | ICD-10-CM | POA: Diagnosis not present

## 2020-05-21 DIAGNOSIS — M6281 Muscle weakness (generalized): Secondary | ICD-10-CM | POA: Diagnosis not present

## 2020-05-21 DIAGNOSIS — M545 Low back pain: Secondary | ICD-10-CM | POA: Diagnosis not present

## 2020-05-21 DIAGNOSIS — Z4800 Encounter for change or removal of nonsurgical wound dressing: Secondary | ICD-10-CM | POA: Diagnosis not present

## 2020-05-21 DIAGNOSIS — I10 Essential (primary) hypertension: Secondary | ICD-10-CM | POA: Diagnosis not present

## 2020-05-21 DIAGNOSIS — L89312 Pressure ulcer of right buttock, stage 2: Secondary | ICD-10-CM | POA: Diagnosis not present

## 2020-05-30 NOTE — Progress Notes (Signed)
Subjective: 1. Urgency of urination   2. Weak urinary stream   3. Urge incontinence   4. Incomplete bladder emptying   5. BPH with urinary obstruction      Terry Hernandez is an 86 yo male who is sent in consultation by Dr. Kristeen Miss for urinary urgency.  He sees Dr. Ellene Route for spinal stenosis and had a laminectomy in 4/21.  The symptoms predated the surgery but has worsened.  He has UUI and has been using about 3 pads daily.  He has nocturia q1-2hrs.   He has frequency.  He has some intermittency with a weak stream but feels he empties.  He has had no hematuria or dysuria.   He is not on any therapy and has no history of UTI's.  He had one stone but no GU surgery.    Cr was 1.05 on 03/18/20.  He had a CT on 04/16/20 for constipation but had a distended bladder with mild bilateral hydro and moderate prostate enlargement.  ROS:  ROS  Allergies  Allergen Reactions  . Cephalexin     REACTION: rash    Past Medical History:  Diagnosis Date  . Arthritis   . GERD (gastroesophageal reflux disease)    PMH  . Glucose intolerance (impaired glucose tolerance)   . High cholesterol   . History of kidney stones   . HTN (hypertension)   . Hyperlipidemia   . Lumbar stenosis with neurogenic claudication   . Pneumonia   . Subcutaneous mass   . Wears glasses     Past Surgical History:  Procedure Laterality Date  . APPENDECTOMY    . COLONOSCOPY W/ BIOPSIES AND POLYPECTOMY    . LUMBAR LAMINECTOMY/DECOMPRESSION MICRODISCECTOMY Bilateral 03/18/2020   Procedure: Bilateral Lumbar three-four Lumbar four-five Laminotomy/foraminotomy;  Surgeon: Kristeen Miss, MD;  Location: [place];  Service: Neurosurgery;  Laterality: Bilateral;  . MASS EXCISION Right 03/18/2020   Procedure: Excision of a Right gluteal subcutaneous mass;  Surgeon: Kristeen Miss, MD;  Location: [place];  Service: Neurosurgery;  Laterality: Right;  . MULTIPLE TOOTH EXTRACTIONS      Social History   Socioeconomic History  . Marital status:  Married    Spouse name: Not on file  . Number of children: 2  . Years of education: Not on file  . Highest education level: Not on file  Occupational History  . Not on file  Tobacco Use  . Smoking status: Never Smoker  . Smokeless tobacco: Never Used  Vaping Use  . Vaping Use: Never used  Substance and Sexual Activity  . Alcohol use: Never  . Drug use: Never  . Sexual activity: Not on file  Other Topics Concern  . Not on file  Social History Narrative  . Not on file   Social Determinants of Health   Financial Resource Strain:   . Difficulty of Paying Living Expenses:   Food Insecurity:   . Worried About Charity fundraiser in the Last Year:   . Arboriculturist in the Last Year:   Transportation Needs:   . Film/video editor (Medical):   Marland Kitchen Lack of Transportation (Non-Medical):   Physical Activity:   . Days of Exercise per Week:   . Minutes of Exercise per Session:   Stress:   . Feeling of Stress :   Social Connections:   . Frequency of Communication with Friends and Family:   . Frequency of Social Gatherings with Friends and Family:   . Attends Religious Services:   .  Active Member of Clubs or Organizations:   . Attends Archivist Meetings:   Marland Kitchen Marital Status:   Intimate Partner Violence:   . Fear of Current or Ex-Partner:   . Emotionally Abused:   Marland Kitchen Physically Abused:   . Sexually Abused:     Family History  Problem Relation Age of Onset  . Stroke Father   . Cancer Mother     Anti-infectives: Anti-infectives (From admission, onward)   None      Current Outpatient Medications  Medication Sig Dispense Refill  . acetaminophen (TYLENOL) 500 MG tablet Take 1,000 mg by mouth 4 (four) times daily.    Marland Kitchen aspirin EC 81 MG tablet Take 81 mg by mouth at bedtime.     Marland Kitchen atorvastatin (LIPITOR) 20 MG tablet Take 20 mg by mouth at bedtime.     Marland Kitchen esomeprazole (NEXIUM) 40 MG capsule Take 40 mg by mouth daily.    . hydrochlorothiazide (HYDRODIURIL) 25 MG  tablet Take 25 mg by mouth daily as needed (swelling of the feet).     Marland Kitchen losartan (COZAAR) 100 MG tablet Take 100 mg by mouth at bedtime.     . methocarbamol (ROBAXIN) 500 MG tablet Take 1 tablet (500 mg total) by mouth every 6 (six) hours as needed for muscle spasms. 30 tablet 3  . metoprolol succinate (TOPROL-XL) 25 MG 24 hr tablet Take 25 mg by mouth at bedtime.     . Multiple Vitamin (MULTIVITAMIN) tablet Take 1 tablet by mouth at bedtime.     . SSD 1 % cream Apply 1 application topically daily.     . traMADol (ULTRAM) 50 MG tablet Take 50 mg by mouth 4 (four) times daily.    Marland Kitchen triamcinolone ointment (KENALOG) 0.1 % Apply 1 application topically daily as needed (cracked hands).     . oxyCODONE-acetaminophen (PERCOCET/ROXICET) 5-325 MG tablet Take 1-2 tablets by mouth every 4 (four) hours as needed for moderate pain or severe pain. (Patient not taking: Reported on 05/31/2020) 40 tablet 0  . silodosin (RAPAFLO) 8 MG CAPS capsule Take 1 capsule (8 mg total) by mouth daily with breakfast. 30 capsule 11   No current facility-administered medications for this visit.     Objective: Vital signs in last 24 hours: BP 129/86   Pulse 87   Temp 97.9 F (36.6 C)   Ht 5' 10.5" (1.791 m)   Wt 158 lb (71.7 kg)   BMI 22.35 kg/m      Physical Exam  Lab Results:  Results for orders placed or performed in visit on 05/31/20 (from the past 24 hour(s))  POCT urinalysis dipstick     Status: Abnormal   Collection Time: 05/31/20 12:12 PM  Result Value Ref Range   Color, UA yellow    Clarity, UA     Glucose, UA Negative Negative   Bilirubin, UA neg    Ketones, UA neg    Spec Grav, UA 1.020 1.010 - 1.025   Blood, UA neg    pH, UA 6.0 5.0 - 8.0   Protein, UA Positive (A) Negative   Urobilinogen, UA 0.2 0.2 or 1.0 E.U./dL   Nitrite, UA neg    Leukocytes, UA Negative Negative   Appearance clear    Odor      BMET No results for input(s): NA, K, CL, CO2, GLUCOSE, BUN, CREATININE, CALCIUM in the  last 72 hours. PT/INR No results for input(s): LABPROT, INR in the last 72 hours. ABG No results for input(s): PHART,  HCO3 in the last 72 hours.  Invalid input(s): PCO2, PO2  Studies/Results: I reviewed the CT from 04/16/20 and he had bilateral mild hydro and a distended bladder at that time.  His prostate was not very large.   PVR is 122ml today.     Assessment/Plan: Urgency and UUI with incomplete bladder emptying and probable BPH with BOO.    I am going to give him silodosin 8mg  daily since it seems to do better with the OAB symptoms than tamsulosin.  He will return in 4-6 weeks for a flowrate and PVR.   Side effects of the med reviewed.  Meds ordered this encounter  Medications  . silodosin (RAPAFLO) 8 MG CAPS capsule    Sig: Take 1 capsule (8 mg total) by mouth daily with breakfast.    Dispense:  30 capsule    Refill:  11     Orders Placed This Encounter  Procedures  . POCT urinalysis dipstick  . BLADDER SCAN AMB NON-IMAGING     Return for Flowrate and PVR on return. .    CC: Dr. Kristeen Miss and Dr. Consuello Masse.      Irine Seal 05/31/2020 [phone]

## 2020-05-31 ENCOUNTER — Encounter: Payer: Self-pay | Admitting: Urology

## 2020-05-31 ENCOUNTER — Ambulatory Visit: Payer: Medicare Other | Admitting: Urology

## 2020-05-31 ENCOUNTER — Other Ambulatory Visit: Payer: Self-pay

## 2020-05-31 VITALS — BP 129/86 | HR 87 | Temp 97.9°F | Ht 70.5 in | Wt 158.0 lb

## 2020-05-31 DIAGNOSIS — R3915 Urgency of urination: Secondary | ICD-10-CM | POA: Diagnosis not present

## 2020-05-31 DIAGNOSIS — R3912 Poor urinary stream: Secondary | ICD-10-CM | POA: Diagnosis not present

## 2020-05-31 DIAGNOSIS — R339 Retention of urine, unspecified: Secondary | ICD-10-CM | POA: Diagnosis not present

## 2020-05-31 DIAGNOSIS — N401 Enlarged prostate with lower urinary tract symptoms: Secondary | ICD-10-CM

## 2020-05-31 DIAGNOSIS — N3941 Urge incontinence: Secondary | ICD-10-CM

## 2020-05-31 DIAGNOSIS — N138 Other obstructive and reflux uropathy: Secondary | ICD-10-CM

## 2020-05-31 LAB — POCT URINALYSIS DIPSTICK
Bilirubin, UA: NEGATIVE
Blood, UA: NEGATIVE
Glucose, UA: NEGATIVE
Ketones, UA: NEGATIVE
Leukocytes, UA: NEGATIVE
Nitrite, UA: NEGATIVE
Protein, UA: POSITIVE — AB
Spec Grav, UA: 1.02 (ref 1.010–1.025)
Urobilinogen, UA: 0.2 E.U./dL
pH, UA: 6 (ref 5.0–8.0)

## 2020-05-31 LAB — BLADDER SCAN AMB NON-IMAGING: Scan Result: 160

## 2020-05-31 MED ORDER — SILODOSIN 8 MG PO CAPS: 8.0000 mg | ORAL_CAPSULE | Freq: Every day | ORAL | 11 refills | Status: DC

## 2020-05-31 NOTE — Progress Notes (Signed)
Urological Symptom Review  Patient is experiencing the following symptoms: Frequent urination Hard to postpone urination Get up at night to urinate Trouble starting stream  Leakage of urine Stream start and stop Hematuria Kidney stones   Review of Systems  Gastrointestinal (upper)  : Negative for upper GI symptoms  Gastrointestinal (lower) : Negative for lower GI symptoms  Constitutional : Weight loss  Skin: Negative for skin symptoms  Eyes: Negative for eye symptoms  Ear/Nose/Throat : Negative for Ear/Nose/Throat symptoms  Hematologic/Lymphatic: Easy bruising  Cardiovascular : Leg swelling  Respiratory : Negative for respiratory symptoms  Endocrine: Negative for endocrine symptoms  Musculoskeletal: Back pain  Neurological: He has some weakness in his legs.   Psychologic: Negative for psychiatric symptoms

## 2020-06-05 DIAGNOSIS — M21372 Foot drop, left foot: Secondary | ICD-10-CM | POA: Diagnosis not present

## 2020-06-05 DIAGNOSIS — M48061 Spinal stenosis, lumbar region without neurogenic claudication: Secondary | ICD-10-CM | POA: Diagnosis not present

## 2020-06-05 DIAGNOSIS — Z6822 Body mass index (BMI) 22.0-22.9, adult: Secondary | ICD-10-CM | POA: Diagnosis not present

## 2020-06-05 DIAGNOSIS — M1711 Unilateral primary osteoarthritis, right knee: Secondary | ICD-10-CM | POA: Diagnosis not present

## 2020-06-05 DIAGNOSIS — N183 Chronic kidney disease, stage 3 unspecified: Secondary | ICD-10-CM | POA: Diagnosis not present

## 2020-06-05 DIAGNOSIS — M25461 Effusion, right knee: Secondary | ICD-10-CM | POA: Diagnosis not present

## 2020-07-11 NOTE — Progress Notes (Signed)
Subjective: 1. Incomplete bladder emptying     Terry Hernandez returns today in f/u to assess his response to sildenafil.   He is doing well with an IPSS of 13.   He continues to have accidents but is doing better on the silodosin 8mg .  His nocturia is down to 1-2x.  His PVR today is 160ml.  His flowrate was with only 20ml and his PF was 52ml/sec.  He has no hematuria or dysuria.   Terry Hernandez is an 86 yo male who is sent in consultation by Dr. Kristeen Miss for urinary urgency.  He sees Dr. Ellene Route for spinal stenosis and had a laminectomy in 4/21.  The symptoms predated the surgery but has worsened.  He has UUI and has been using about 3 pads daily.  He has nocturia q1-2hrs.   He has frequency.  He has some intermittency with a weak stream but feels he empties.  He has had no hematuria or dysuria.   He is not on any therapy and has no history of UTI's.  He had one stone but no GU surgery.    Cr was 1.05 on 03/18/20.  He had a CT on 04/16/20 for constipation but had a distended bladder with mild bilateral hydro and moderate prostate enlargement.  ROS:  ROS  Allergies  Allergen Reactions  . Cephalexin     REACTION: rash    Past Medical History:  Diagnosis Date  . Arthritis   . GERD (gastroesophageal reflux disease)    PMH  . Glucose intolerance (impaired glucose tolerance)   . High cholesterol   . History of kidney stones   . HTN (hypertension)   . Hyperlipidemia   . Lumbar stenosis with neurogenic claudication   . Pneumonia   . Subcutaneous mass   . Wears glasses     Past Surgical History:  Procedure Laterality Date  . APPENDECTOMY    . COLONOSCOPY W/ BIOPSIES AND POLYPECTOMY    . LUMBAR LAMINECTOMY/DECOMPRESSION MICRODISCECTOMY Bilateral 03/18/2020   Procedure: Bilateral Lumbar three-four Lumbar four-five Laminotomy/foraminotomy;  Surgeon: Kristeen Miss, MD;  Location: [place];  Service: Neurosurgery;  Laterality: Bilateral;  . MASS EXCISION Right 03/18/2020   Procedure: Excision of a Right  gluteal subcutaneous mass;  Surgeon: Kristeen Miss, MD;  Location: [place];  Service: Neurosurgery;  Laterality: Right;  . MULTIPLE TOOTH EXTRACTIONS      Social History   Socioeconomic History  . Marital status: Married    Spouse name: Not on file  . Number of children: 2  . Years of education: Not on file  . Highest education level: Not on file  Occupational History  . Not on file  Tobacco Use  . Smoking status: Never Smoker  . Smokeless tobacco: Never Used  Vaping Use  . Vaping Use: Never used  Substance and Sexual Activity  . Alcohol use: Never  . Drug use: Never  . Sexual activity: Not on file  Other Topics Concern  . Not on file  Social History Narrative  . Not on file   Social Determinants of Health   Financial Resource Strain:   . Difficulty of Paying Living Expenses:   Food Insecurity:   . Worried About Charity fundraiser in the Last Year:   . Arboriculturist in the Last Year:   Transportation Needs:   . Film/video editor (Medical):   Marland Kitchen Lack of Transportation (Non-Medical):   Physical Activity:   . Days of Exercise per Week:   .  Minutes of Exercise per Session:   Stress:   . Feeling of Stress :   Social Connections:   . Frequency of Communication with Friends and Family:   . Frequency of Social Gatherings with Friends and Family:   . Attends Religious Services:   . Active Member of Clubs or Organizations:   . Attends Archivist Meetings:   Marland Kitchen Marital Status:   Intimate Partner Violence:   . Fear of Current or Ex-Partner:   . Emotionally Abused:   Marland Kitchen Physically Abused:   . Sexually Abused:     Family History  Problem Relation Age of Onset  . Stroke Father   . Cancer Mother     Anti-infectives: Anti-infectives (From admission, onward)   None      Current Outpatient Medications  Medication Sig Dispense Refill  . acetaminophen (TYLENOL) 500 MG tablet Take 1,000 mg by mouth 4 (four) times daily.    Marland Kitchen aspirin EC 81 MG tablet Take  81 mg by mouth at bedtime.     Marland Kitchen atorvastatin (LIPITOR) 20 MG tablet Take 20 mg by mouth at bedtime.     Marland Kitchen esomeprazole (NEXIUM) 40 MG capsule Take 40 mg by mouth daily.    . hydrochlorothiazide (HYDRODIURIL) 25 MG tablet Take 25 mg by mouth daily as needed (swelling of the feet).     Marland Kitchen losartan (COZAAR) 100 MG tablet Take 100 mg by mouth at bedtime.     . methocarbamol (ROBAXIN) 500 MG tablet Take 1 tablet (500 mg total) by mouth every 6 (six) hours as needed for muscle spasms. 30 tablet 3  . metoprolol succinate (TOPROL-XL) 25 MG 24 hr tablet Take 25 mg by mouth at bedtime.     . Multiple Vitamin (MULTIVITAMIN) tablet Take 1 tablet by mouth at bedtime.     Marland Kitchen oxyCODONE-acetaminophen (PERCOCET/ROXICET) 5-325 MG tablet Take 1-2 tablets by mouth every 4 (four) hours as needed for moderate pain or severe pain. 40 tablet 0  . silodosin (RAPAFLO) 8 MG CAPS capsule Take 1 capsule (8 mg total) by mouth daily with breakfast. 30 capsule 11  . SSD 1 % cream Apply 1 application topically daily.     . traMADol (ULTRAM) 50 MG tablet Take 50 mg by mouth 4 (four) times daily.    Marland Kitchen triamcinolone ointment (KENALOG) 0.1 % Apply 1 application topically daily as needed (cracked hands).      No current facility-administered medications for this visit.     Objective: Vital signs in last 24 hours: BP (!) 141/64   Pulse 92   Ht 5\' 10"  (1.778 m)   Wt 147 lb (66.7 kg)   BMI 21.09 kg/m      Physical Exam  Lab Results:  No results found for this or any previous visit (from the past 24 hour(s)).  BMET No results for input(s): NA, K, CL, CO2, GLUCOSE, BUN, CREATININE, CALCIUM in the last 72 hours. PT/INR No results for input(s): LABPROT, INR in the last 72 hours. ABG No results for input(s): PHART, HCO3 in the last 72 hours.  Invalid input(s): PCO2, PO2  Studies/Results: I reviewed the CT from 04/16/20 and he had bilateral mild hydro and a distended bladder at that time.  His prostate was not very large.    PVR is 19ml today.     Assessment/Plan: Urgency and UUI with incomplete bladder emptying and probable BPH with BOO.    I am going to give him silodosin 8mg  daily since it seems to do  better with the OAB symptoms than tamsulosin.  He will return in 4-6 weeks for a flowrate and PVR.   Side effects of the med reviewed.  No orders of the defined types were placed in this encounter.    Orders Placed This Encounter  Procedures  . Urinalysis, Routine w reflex microscopic  . PR COMPLEX UROFLOWMETRY  . BLADDER SCAN AMB NON-IMAGING     Return in about 3 months (around 10/12/2020) for flowrate, PVR and UA..    CC: Dr. Kristeen Miss and Dr. Consuello Masse.      Irine Seal 07/12/2020 [phone]

## 2020-07-12 ENCOUNTER — Encounter: Payer: Self-pay | Admitting: Urology

## 2020-07-12 ENCOUNTER — Ambulatory Visit (INDEPENDENT_AMBULATORY_CARE_PROVIDER_SITE_OTHER): Payer: Medicare Other | Admitting: Urology

## 2020-07-12 ENCOUNTER — Other Ambulatory Visit: Payer: Self-pay

## 2020-07-12 VITALS — BP 141/64 | HR 92 | Ht 70.0 in | Wt 147.0 lb

## 2020-07-12 DIAGNOSIS — R339 Retention of urine, unspecified: Secondary | ICD-10-CM

## 2020-07-12 LAB — URINALYSIS, ROUTINE W REFLEX MICROSCOPIC
Bilirubin, UA: NEGATIVE
Glucose, UA: NEGATIVE
Ketones, UA: NEGATIVE
Leukocytes,UA: NEGATIVE
Nitrite, UA: NEGATIVE
RBC, UA: NEGATIVE
Specific Gravity, UA: 1.025 (ref 1.005–1.030)
Urobilinogen, Ur: 0.2 mg/dL (ref 0.2–1.0)
pH, UA: 5.5 (ref 5.0–7.5)

## 2020-07-12 LAB — MICROSCOPIC EXAMINATION: Renal Epithel, UA: NONE SEEN /hpf

## 2020-07-12 LAB — BLADDER SCAN AMB NON-IMAGING: Scan Result: 119.2

## 2020-07-12 NOTE — Progress Notes (Signed)
Urological Symptom Review  Patient is experiencing the following symptoms: Hard to postpone urination Get up at night to urinate Leakage of urine   Review of Systems  Gastrointestinal (upper)  : Negative for upper GI symptoms  Gastrointestinal (lower) : Negative for lower GI symptoms  Constitutional : Negative for symptoms  Skin: Negative for skin symptoms  Eyes: Negative for eye symptoms  Ear/Nose/Throat : Negative for Ear/Nose/Throat symptoms  Hematologic/Lymphatic: Negative for Hematologic/Lymphatic symptoms  Cardiovascular : Negative for cardiovascular symptoms  Respiratory : Negative for respiratory symptoms  Endocrine: Negative for endocrine symptoms  Musculoskeletal: Negative for musculoskeletal symptoms  Neurological: Negative for neurological symptoms  Psychologic: Negative for psychiatric symptoms 

## 2020-07-12 NOTE — Progress Notes (Signed)
Uroflow  Peak Flow: 1ml Average Flow: 47ml Voided Volume: 77ml Voiding Time: 8sec Flow Time: 8sec Time to Peak Flow: 0sec  PVR Volume: 119.79ml

## 2020-07-22 DIAGNOSIS — Z0001 Encounter for general adult medical examination with abnormal findings: Secondary | ICD-10-CM | POA: Diagnosis not present

## 2020-07-22 DIAGNOSIS — Z23 Encounter for immunization: Secondary | ICD-10-CM | POA: Diagnosis not present

## 2020-07-22 DIAGNOSIS — I10 Essential (primary) hypertension: Secondary | ICD-10-CM | POA: Diagnosis not present

## 2020-07-22 DIAGNOSIS — E7800 Pure hypercholesterolemia, unspecified: Secondary | ICD-10-CM | POA: Diagnosis not present

## 2020-07-22 DIAGNOSIS — N183 Chronic kidney disease, stage 3 unspecified: Secondary | ICD-10-CM | POA: Diagnosis not present

## 2020-07-22 DIAGNOSIS — E1129 Type 2 diabetes mellitus with other diabetic kidney complication: Secondary | ICD-10-CM | POA: Diagnosis not present

## 2020-07-22 DIAGNOSIS — Z6822 Body mass index (BMI) 22.0-22.9, adult: Secondary | ICD-10-CM | POA: Diagnosis not present

## 2020-07-22 DIAGNOSIS — R945 Abnormal results of liver function studies: Secondary | ICD-10-CM | POA: Diagnosis not present

## 2020-08-12 DIAGNOSIS — Z6822 Body mass index (BMI) 22.0-22.9, adult: Secondary | ICD-10-CM | POA: Diagnosis not present

## 2020-08-12 DIAGNOSIS — M48061 Spinal stenosis, lumbar region without neurogenic claudication: Secondary | ICD-10-CM | POA: Diagnosis not present

## 2020-08-12 DIAGNOSIS — M1712 Unilateral primary osteoarthritis, left knee: Secondary | ICD-10-CM | POA: Diagnosis not present

## 2020-08-12 DIAGNOSIS — N183 Chronic kidney disease, stage 3 unspecified: Secondary | ICD-10-CM | POA: Diagnosis not present

## 2020-08-12 DIAGNOSIS — M21372 Foot drop, left foot: Secondary | ICD-10-CM | POA: Diagnosis not present

## 2020-09-17 DIAGNOSIS — Z6822 Body mass index (BMI) 22.0-22.9, adult: Secondary | ICD-10-CM | POA: Diagnosis not present

## 2020-09-17 DIAGNOSIS — M21372 Foot drop, left foot: Secondary | ICD-10-CM | POA: Diagnosis not present

## 2020-09-17 DIAGNOSIS — N183 Chronic kidney disease, stage 3 unspecified: Secondary | ICD-10-CM | POA: Diagnosis not present

## 2020-09-17 DIAGNOSIS — M1712 Unilateral primary osteoarthritis, left knee: Secondary | ICD-10-CM | POA: Diagnosis not present

## 2020-09-17 DIAGNOSIS — M48061 Spinal stenosis, lumbar region without neurogenic claudication: Secondary | ICD-10-CM | POA: Diagnosis not present

## 2020-10-18 ENCOUNTER — Ambulatory Visit: Payer: Medicare Other | Admitting: Urology

## 2020-10-24 NOTE — Progress Notes (Signed)
Subjective: 1. BPH with urinary obstruction   2. Incomplete bladder emptying   3. Urgency of urination   4. Weak urinary stream     Terry Hernandez returns today in f/u.  He continues to do better with the silodosin and his nocturia remain about 1x.  His IPSS is 8.   He continues to have some urgency with occasional accidents but is doing better on the silodosin 8mg .   His PVR today is 221ml.   He has no hematuria or dysuria.   Terry Hernandez is an 86 yo male who is sent in consultation by Dr. Kristeen Miss for urinary urgency.  He sees Dr. Ellene Route for spinal stenosis and had a laminectomy in 4/21.  The symptoms predated the surgery but has worsened.  He has UUI and has been using about 3 pads daily.  He has nocturia q1-2hrs.   He has frequency.  He has some intermittency with a weak stream but feels he empties.  He has had no hematuria or dysuria.   He is not on any therapy and has no history of UTI's.  He had one stone but no GU surgery.    Cr was 1.05 on 03/18/20.  He had a CT on 04/16/20 for constipation but had a distended bladder with mild bilateral hydro and moderate prostate enlargement.    IPSS    Row Name 10/25/20 1000         International Prostate Symptom Score   How often have you had the sensation of not emptying your bladder? Not at All     How often have you had to urinate less than every two hours? Less than 1 in 5 times     How often have you found you stopped and started again several times when you urinated? Not at All     How often have you found it difficult to postpone urination? Almost always     How often have you had a weak urinary stream? Not at All     How often have you had to strain to start urination? Not at All     How many times did you typically get up at night to urinate? 2 Times     Total IPSS Score 8       Quality of Life due to urinary symptoms   If you were to spend the rest of your life with your urinary condition just the way it is now how would you feel about  that? Mostly Satisfied            ROS:  ROS  Allergies  Allergen Reactions  . Cephalexin     REACTION: rash    Past Medical History:  Diagnosis Date  . Arthritis   . GERD (gastroesophageal reflux disease)    PMH  . Glucose intolerance (impaired glucose tolerance)   . High cholesterol   . History of kidney stones   . HTN (hypertension)   . Hyperlipidemia   . Lumbar stenosis with neurogenic claudication   . Pneumonia   . Subcutaneous mass   . Wears glasses     Past Surgical History:  Procedure Laterality Date  . APPENDECTOMY    . COLONOSCOPY W/ BIOPSIES AND POLYPECTOMY    . LUMBAR LAMINECTOMY/DECOMPRESSION MICRODISCECTOMY Bilateral 03/18/2020   Procedure: Bilateral Lumbar three-four Lumbar four-five Laminotomy/foraminotomy;  Surgeon: Kristeen Miss, MD;  Location: [place];  Service: Neurosurgery;  Laterality: Bilateral;  . MASS EXCISION Right 03/18/2020   Procedure: Excision of a  Right gluteal subcutaneous mass;  Surgeon: Kristeen Miss, MD;  Location: [place];  Service: Neurosurgery;  Laterality: Right;  . MULTIPLE TOOTH EXTRACTIONS      Social History   Socioeconomic History  . Marital status: Married    Spouse name: Not on file  . Number of children: 2  . Years of education: Not on file  . Highest education level: Not on file  Occupational History  . Not on file  Tobacco Use  . Smoking status: Never Smoker  . Smokeless tobacco: Never Used  Vaping Use  . Vaping Use: Never used  Substance and Sexual Activity  . Alcohol use: Never  . Drug use: Never  . Sexual activity: Not on file  Other Topics Concern  . Not on file  Social History Narrative  . Not on file   Social Determinants of Health   Financial Resource Strain:   . Difficulty of Paying Living Expenses: Not on file  Food Insecurity:   . Worried About Charity fundraiser in the Last Year: Not on file  . Ran Out of Food in the Last Year: Not on file  Transportation Needs:   . Lack of Transportation  (Medical): Not on file  . Lack of Transportation (Non-Medical): Not on file  Physical Activity:   . Days of Exercise per Week: Not on file  . Minutes of Exercise per Session: Not on file  Stress:   . Feeling of Stress : Not on file  Social Connections:   . Frequency of Communication with Friends and Family: Not on file  . Frequency of Social Gatherings with Friends and Family: Not on file  . Attends Religious Services: Not on file  . Active Member of Clubs or Organizations: Not on file  . Attends Archivist Meetings: Not on file  . Marital Status: Not on file  Intimate Partner Violence:   . Fear of Current or Ex-Partner: Not on file  . Emotionally Abused: Not on file  . Physically Abused: Not on file  . Sexually Abused: Not on file    Family History  Problem Relation Age of Onset  . Stroke Father   . Cancer Mother     Anti-infectives: Anti-infectives (From admission, onward)   None      Current Outpatient Medications  Medication Sig Dispense Refill  . acetaminophen (TYLENOL) 500 MG tablet Take 1,000 mg by mouth 4 (four) times daily.    Marland Kitchen aspirin EC 81 MG tablet Take 81 mg by mouth at bedtime.     Marland Kitchen atorvastatin (LIPITOR) 20 MG tablet Take 20 mg by mouth at bedtime.     Marland Kitchen esomeprazole (NEXIUM) 40 MG capsule Take 40 mg by mouth daily.    . hydrochlorothiazide (HYDRODIURIL) 25 MG tablet Take 25 mg by mouth daily as needed (swelling of the feet).     Marland Kitchen losartan (COZAAR) 100 MG tablet Take 100 mg by mouth at bedtime.     . methocarbamol (ROBAXIN) 500 MG tablet Take 1 tablet (500 mg total) by mouth every 6 (six) hours as needed for muscle spasms. 30 tablet 3  . metoprolol succinate (TOPROL-XL) 25 MG 24 hr tablet Take 25 mg by mouth at bedtime.     . Multiple Vitamin (MULTIVITAMIN) tablet Take 1 tablet by mouth at bedtime.     . silodosin (RAPAFLO) 8 MG CAPS capsule Take 1 capsule (8 mg total) by mouth daily with breakfast. 30 capsule 11  . traMADol (ULTRAM) 50 MG  tablet  Take 50 mg by mouth 4 (four) times daily.     No current facility-administered medications for this visit.     Objective: Vital signs in last 24 hours: BP 127/67   Pulse 80   Temp 98 F (36.7 C)   Ht 5\' 10"  (1.778 m)   Wt 147 lb (66.7 kg)   BMI 21.09 kg/m      Physical Exam  Lab Results:  No results found for this or any previous visit (from the past 24 hour(s)).  BMET No results for input(s): NA, K, CL, CO2, GLUCOSE, BUN, CREATININE, CALCIUM in the last 72 hours. PT/INR No results for input(s): LABPROT, INR in the last 72 hours. ABG No results for input(s): PHART, HCO3 in the last 72 hours.  Invalid input(s): PCO2, PO2  Studies/Results:  PVR is 275ml today.     Assessment/Plan: Urgency and UUI with incomplete bladder emptying with  BPH with BOO.   He is doing better on silodosin 8mg  daily.  I discussed adding an OAB med but he is content with his symptoms at this time.   No orders of the defined types were placed in this encounter.    Orders Placed This Encounter  Procedures  . Urinalysis, Routine w reflex microscopic  . BLADDER SCAN AMB NON-IMAGING     Return in about 6 months (around 04/24/2021) for PVR.    CC: Dr. Kristeen Miss and Dr. Consuello Masse.      Irine Seal 10/25/2020 [phone]

## 2020-10-25 ENCOUNTER — Ambulatory Visit (INDEPENDENT_AMBULATORY_CARE_PROVIDER_SITE_OTHER): Payer: Medicare Other | Admitting: Urology

## 2020-10-25 ENCOUNTER — Encounter: Payer: Self-pay | Admitting: Urology

## 2020-10-25 ENCOUNTER — Other Ambulatory Visit: Payer: Self-pay

## 2020-10-25 VITALS — BP 127/67 | HR 80 | Temp 98.0°F | Ht 70.0 in | Wt 147.0 lb

## 2020-10-25 DIAGNOSIS — N138 Other obstructive and reflux uropathy: Secondary | ICD-10-CM | POA: Diagnosis not present

## 2020-10-25 DIAGNOSIS — N401 Enlarged prostate with lower urinary tract symptoms: Secondary | ICD-10-CM

## 2020-10-25 DIAGNOSIS — R339 Retention of urine, unspecified: Secondary | ICD-10-CM | POA: Diagnosis not present

## 2020-10-25 DIAGNOSIS — R3912 Poor urinary stream: Secondary | ICD-10-CM

## 2020-10-25 DIAGNOSIS — R3915 Urgency of urination: Secondary | ICD-10-CM

## 2020-10-25 NOTE — Progress Notes (Signed)
Bladder Scan Patient cannot void: 210 ml Performed By: Durenda Guthrie, LPN   Urological Symptom Review  Patient is experiencing the following symptoms: Frequent urination Hard to postpone urination Get up at night to urinate   Review of Systems  Gastrointestinal (upper)  : Negative for upper GI symptoms  Gastrointestinal (lower) : Negative for lower GI symptoms  Constitutional : Weight loss  Skin: Negative for skin symptoms  Eyes: Negative for eye symptoms  Ear/Nose/Throat : Negative for Ear/Nose/Throat symptoms  Hematologic/Lymphatic: Negative for Hematologic/Lymphatic symptoms  Cardiovascular : Negative for cardiovascular symptoms  Respiratory : Negative for respiratory symptoms  Endocrine: Negative for endocrine symptoms  Musculoskeletal: Negative for musculoskeletal symptoms  Neurological: Negative for neurological symptoms  Psychologic: Negative for psychiatric symptoms

## 2020-11-18 DIAGNOSIS — L570 Actinic keratosis: Secondary | ICD-10-CM | POA: Diagnosis not present

## 2020-12-03 DIAGNOSIS — M21372 Foot drop, left foot: Secondary | ICD-10-CM | POA: Diagnosis not present

## 2020-12-03 DIAGNOSIS — M48061 Spinal stenosis, lumbar region without neurogenic claudication: Secondary | ICD-10-CM | POA: Diagnosis not present

## 2020-12-03 DIAGNOSIS — E1129 Type 2 diabetes mellitus with other diabetic kidney complication: Secondary | ICD-10-CM | POA: Diagnosis not present

## 2020-12-03 DIAGNOSIS — I4891 Unspecified atrial fibrillation: Secondary | ICD-10-CM | POA: Diagnosis not present

## 2020-12-03 DIAGNOSIS — I10 Essential (primary) hypertension: Secondary | ICD-10-CM | POA: Diagnosis not present

## 2020-12-03 DIAGNOSIS — Z23 Encounter for immunization: Secondary | ICD-10-CM | POA: Diagnosis not present

## 2020-12-23 DIAGNOSIS — R9431 Abnormal electrocardiogram [ECG] [EKG]: Secondary | ICD-10-CM | POA: Diagnosis not present

## 2021-01-13 DIAGNOSIS — R9431 Abnormal electrocardiogram [ECG] [EKG]: Secondary | ICD-10-CM | POA: Diagnosis not present

## 2021-01-13 DIAGNOSIS — I472 Ventricular tachycardia: Secondary | ICD-10-CM | POA: Diagnosis not present

## 2021-01-13 DIAGNOSIS — I471 Supraventricular tachycardia: Secondary | ICD-10-CM | POA: Diagnosis not present

## 2021-01-16 DIAGNOSIS — I471 Supraventricular tachycardia: Secondary | ICD-10-CM | POA: Diagnosis not present

## 2021-03-18 DIAGNOSIS — M25462 Effusion, left knee: Secondary | ICD-10-CM | POA: Diagnosis not present

## 2021-03-18 DIAGNOSIS — M1712 Unilateral primary osteoarthritis, left knee: Secondary | ICD-10-CM | POA: Diagnosis not present

## 2021-04-24 ENCOUNTER — Ambulatory Visit: Payer: Medicare Other | Admitting: Urology

## 2021-05-15 ENCOUNTER — Other Ambulatory Visit: Payer: Self-pay

## 2021-05-15 ENCOUNTER — Ambulatory Visit: Payer: Medicare Other | Admitting: Urology

## 2021-05-15 ENCOUNTER — Encounter: Payer: Self-pay | Admitting: Urology

## 2021-05-15 VITALS — BP 143/79 | HR 87 | Ht 70.0 in

## 2021-05-15 DIAGNOSIS — R339 Retention of urine, unspecified: Secondary | ICD-10-CM

## 2021-05-15 DIAGNOSIS — N401 Enlarged prostate with lower urinary tract symptoms: Secondary | ICD-10-CM

## 2021-05-15 DIAGNOSIS — R3915 Urgency of urination: Secondary | ICD-10-CM

## 2021-05-15 DIAGNOSIS — N3941 Urge incontinence: Secondary | ICD-10-CM | POA: Diagnosis not present

## 2021-05-15 DIAGNOSIS — N138 Other obstructive and reflux uropathy: Secondary | ICD-10-CM

## 2021-05-15 LAB — MICROSCOPIC EXAMINATION
Bacteria, UA: NONE SEEN
Epithelial Cells (non renal): NONE SEEN /hpf (ref 0–10)
RBC, Urine: NONE SEEN /hpf (ref 0–2)
Renal Epithel, UA: NONE SEEN /hpf
WBC, UA: NONE SEEN /hpf (ref 0–5)

## 2021-05-15 LAB — URINALYSIS, ROUTINE W REFLEX MICROSCOPIC
Bilirubin, UA: NEGATIVE
Glucose, UA: NEGATIVE
Ketones, UA: NEGATIVE
Leukocytes,UA: NEGATIVE
Nitrite, UA: NEGATIVE
RBC, UA: NEGATIVE
Specific Gravity, UA: 1.025 (ref 1.005–1.030)
Urobilinogen, Ur: 0.2 mg/dL (ref 0.2–1.0)
pH, UA: 5.5 (ref 5.0–7.5)

## 2021-05-15 MED ORDER — SILODOSIN 8 MG PO CAPS
8.0000 mg | ORAL_CAPSULE | Freq: Every day | ORAL | 11 refills | Status: DC
Start: 2021-05-15 — End: 2021-11-17

## 2021-05-15 NOTE — Progress Notes (Signed)
Subjective: 1. BPH with urinary obstruction   2. Incomplete bladder emptying   3. Urgency of urination   4. Urge incontinence     05/15/21: Mr. Katzenstein returns today in f/u for his history of BPH with BOO and OAB wet.  He remains on silodosin and is doing well with an IPSS of 4.  He has nocturia x 2.  He has minimal UUI.  He has no hematuria or dysuria.  He continues to use a pullup just for safety's sake.   He is emptying well.   His PVR is 75ml today.  10/25/20: Mr. Callander returns today in f/u.  He continues to do better with the silodosin and his nocturia remain about 1x.  His IPSS is 8.   He continues to have some urgency with occasional accidents but is doing better on the silodosin 8mg .   His PVR today is 23ml.   He has no hematuria or dysuria.   Mr. Strader is an 87 yo male who is sent in consultation by Dr. Kristeen Miss for urinary urgency.  He sees Dr. Ellene Route for spinal stenosis and had a laminectomy in 4/21.  The symptoms predated the surgery but has worsened.  He has UUI and has been using about 3 pads daily.  He has nocturia q1-2hrs.   He has frequency.  He has some intermittency with a weak stream but feels he empties.  He has had no hematuria or dysuria.   He is not on any therapy and has no history of UTI's.  He had one stone but no GU surgery.    Cr was 1.05 on 03/18/20.  He had a CT on 04/16/20 for constipation but had a distended bladder with mild bilateral hydro and moderate prostate enlargement.     ROS:  ROS  Allergies  Allergen Reactions  . Cephalexin Rash    REACTION: rash    Past Medical History:  Diagnosis Date  . Arthritis   . GERD (gastroesophageal reflux disease)    PMH  . Glucose intolerance (impaired glucose tolerance)   . High cholesterol   . History of kidney stones   . HTN (hypertension)   . Hyperlipidemia   . Lumbar stenosis with neurogenic claudication   . Pneumonia   . Subcutaneous mass   . Wears glasses     Past Surgical History:  Procedure  Laterality Date  . APPENDECTOMY    . COLONOSCOPY W/ BIOPSIES AND POLYPECTOMY    . LUMBAR LAMINECTOMY/DECOMPRESSION MICRODISCECTOMY Bilateral 03/18/2020   Procedure: Bilateral Lumbar three-four Lumbar four-five Laminotomy/foraminotomy;  Surgeon: Kristeen Miss, MD;  Location: [facility];  Service: Neurosurgery;  Laterality: Bilateral;  . MASS EXCISION Right 03/18/2020   Procedure: Excision of a Right gluteal subcutaneous mass;  Surgeon: Kristeen Miss, MD;  Location: [facility];  Service: Neurosurgery;  Laterality: Right;  . MULTIPLE TOOTH EXTRACTIONS      Social History   Socioeconomic History  . Marital status: Married    Spouse name: Not on file  . Number of children: 2  . Years of education: Not on file  . Highest education level: Not on file  Occupational History  . Not on file  Tobacco Use  . Smoking status: Never Smoker  . Smokeless tobacco: Never Used  Vaping Use  . Vaping Use: Never used  Substance and Sexual Activity  . Alcohol use: Never  . Drug use: Never  . Sexual activity: Not on file  Other Topics Concern  . Not on file  Social  History Narrative  . Not on file   Social Determinants of Health   Financial Resource Strain: Not on file  Food Insecurity: Not on file  Transportation Needs: Not on file  Physical Activity: Not on file  Stress: Not on file  Social Connections: Not on file  Intimate Partner Violence: Not on file    Family History  Problem Relation Age of Onset  . Stroke Father   . Cancer Mother     Anti-infectives: Anti-infectives (From admission, onward)   None      Current Outpatient Medications  Medication Sig Dispense Refill  . acetaminophen (TYLENOL) 500 MG tablet Take 1,000 mg by mouth 4 (four) times daily.    Marland Kitchen aspirin EC 81 MG tablet Take 81 mg by mouth at bedtime.     Marland Kitchen atorvastatin (LIPITOR) 20 MG tablet Take 20 mg by mouth at bedtime.     Marland Kitchen esomeprazole (NEXIUM) 40 MG capsule Take 40 mg by mouth daily.    . hydrochlorothiazide  (HYDRODIURIL) 25 MG tablet Take 25 mg by mouth daily as needed (swelling of the feet).     Marland Kitchen losartan (COZAAR) 100 MG tablet Take 100 mg by mouth at bedtime.     . methocarbamol (ROBAXIN) 500 MG tablet Take 1 tablet (500 mg total) by mouth every 6 (six) hours as needed for muscle spasms. 30 tablet 3  . metoprolol succinate (TOPROL-XL) 25 MG 24 hr tablet Take 25 mg by mouth at bedtime.     . Multiple Vitamin (MULTIVITAMIN) tablet Take 1 tablet by mouth at bedtime.     . traMADol (ULTRAM) 50 MG tablet Take 50 mg by mouth 4 (four) times daily.    . silodosin (RAPAFLO) 8 MG CAPS capsule Take 1 capsule (8 mg total) by mouth daily with breakfast. 30 capsule 11   No current facility-administered medications for this visit.     Objective: Vital signs in last 24 hours: BP (!) 143/79   Pulse 87   Ht 5\' 10"  (1.778 m)   BMI 21.09 kg/m      Physical Exam  Lab Results:  Results for orders placed or performed in visit on 05/15/21 (from the past 24 hour(s))  Urinalysis, Routine w reflex microscopic     Status: Abnormal   Collection Time: 05/15/21  2:27 PM  Result Value Ref Range   Specific Gravity, UA 1.025 1.005 - 1.030   pH, UA 5.5 5.0 - 7.5   Color, UA Yellow Yellow   Appearance Ur Clear Clear   Leukocytes,UA Negative Negative   Protein,UA 1+ (A) Negative/Trace   Glucose, UA Negative Negative   Ketones, UA Negative Negative   RBC, UA Negative Negative   Bilirubin, UA Negative Negative   Urobilinogen, Ur 0.2 0.2 - 1.0 mg/dL   Nitrite, UA Negative Negative   Microscopic Examination See below:    Narrative   Performed at:  [facility] [address], [place], [place]  [id number] Lab Director: Mina Marble [place], Phone:  [phone]  Microscopic Examination     Status: None   Collection Time: 05/15/21  2:27 PM   Urine  Result Value Ref Range   WBC, UA None seen 0 - 5 /hpf   RBC None seen 0 - 2 /hpf   Epithelial Cells (non renal) None seen 0 - 10 /hpf   Renal Epithel,  UA None seen None seen /hpf   Casts Present None seen /lpf   Cast Type Hyaline casts N/A   Mucus,  UA Present Not Estab.   Bacteria, UA None seen None seen/Few   Narrative   Performed at:  [facility] [address] [place], [place]  [id number] Lab Director: [place], Phone:  [phone]    BMET No results for input(s): NA, K, CL, CO2, GLUCOSE, BUN, CREATININE, CALCIUM in the last 72 hours. PT/INR No results for input(s): LABPROT, INR in the last 72 hours. ABG No results for input(s): PHART, HCO3 in the last 72 hours.  Invalid input(s): PCO2, PO2 UA is clear.  Studies/Results:  PVR is 78ml today.     Assessment/Plan: Urgency and UUI with incomplete bladder emptying with  BPH with BOO.   He continues to do well on silodosin 8mg  daily.  I have refilled that and will have him return in a year.   Meds ordered this encounter  Medications  . silodosin (RAPAFLO) 8 MG CAPS capsule    Sig: Take 1 capsule (8 mg total) by mouth daily with breakfast.    Dispense:  30 capsule    Refill:  11     Orders Placed This Encounter  Procedures  . Microscopic Examination  . Urinalysis, Routine w reflex microscopic  . BLADDER SCAN AMB NON-IMAGING     Return in about 1 year (around 05/15/2022) for with a  PVR.    CC: Dr. Kristeen Miss and Dr. Consuello Masse.      Irine Seal 05/15/2021 [phone] ID: [name], male   DOB: 1933-12-21, 87 y.o.   MRN: [id number]

## 2021-05-15 NOTE — Progress Notes (Signed)
PVR=76 ml   Urological Symptom Review  Patient is experiencing the following symptoms: Get up at night to urinate   Review of Systems  Gastrointestinal (upper)  : Negative for upper GI symptoms  Gastrointestinal (lower) : Negative for lower GI symptoms  Constitutional : Negative for symptoms  Skin: Negative for skin symptoms  Eyes: Negative for eye symptoms  Ear/Nose/Throat : Negative for Ear/Nose/Throat symptoms  Hematologic/Lymphatic: Negative for Hematologic/Lymphatic symptoms  Cardiovascular : Negative for cardiovascular symptoms  Respiratory : Negative for respiratory symptoms  Endocrine: Negative for endocrine symptoms  Musculoskeletal: Negative for musculoskeletal symptoms  Neurological: Negative for neurological symptoms  Psychologic: Negative for psychiatric symptoms

## 2021-05-20 DIAGNOSIS — L570 Actinic keratosis: Secondary | ICD-10-CM | POA: Diagnosis not present

## 2021-06-05 DIAGNOSIS — M48061 Spinal stenosis, lumbar region without neurogenic claudication: Secondary | ICD-10-CM | POA: Diagnosis not present

## 2021-06-05 DIAGNOSIS — N183 Chronic kidney disease, stage 3 unspecified: Secondary | ICD-10-CM | POA: Diagnosis not present

## 2021-06-05 DIAGNOSIS — D649 Anemia, unspecified: Secondary | ICD-10-CM | POA: Diagnosis not present

## 2021-06-05 DIAGNOSIS — D529 Folate deficiency anemia, unspecified: Secondary | ICD-10-CM | POA: Diagnosis not present

## 2021-06-05 DIAGNOSIS — M21372 Foot drop, left foot: Secondary | ICD-10-CM | POA: Diagnosis not present

## 2021-06-05 DIAGNOSIS — Z23 Encounter for immunization: Secondary | ICD-10-CM | POA: Diagnosis not present

## 2021-06-05 DIAGNOSIS — I4891 Unspecified atrial fibrillation: Secondary | ICD-10-CM | POA: Diagnosis not present

## 2021-06-05 DIAGNOSIS — E1122 Type 2 diabetes mellitus with diabetic chronic kidney disease: Secondary | ICD-10-CM | POA: Diagnosis not present

## 2021-06-05 DIAGNOSIS — R5383 Other fatigue: Secondary | ICD-10-CM | POA: Diagnosis not present

## 2021-06-05 DIAGNOSIS — R945 Abnormal results of liver function studies: Secondary | ICD-10-CM | POA: Diagnosis not present

## 2021-06-05 DIAGNOSIS — M1712 Unilateral primary osteoarthritis, left knee: Secondary | ICD-10-CM | POA: Diagnosis not present

## 2021-06-05 DIAGNOSIS — D519 Vitamin B12 deficiency anemia, unspecified: Secondary | ICD-10-CM | POA: Diagnosis not present

## 2021-06-05 DIAGNOSIS — I10 Essential (primary) hypertension: Secondary | ICD-10-CM | POA: Diagnosis not present

## 2021-07-23 DIAGNOSIS — E1122 Type 2 diabetes mellitus with diabetic chronic kidney disease: Secondary | ICD-10-CM | POA: Diagnosis not present

## 2021-07-23 DIAGNOSIS — Z0001 Encounter for general adult medical examination with abnormal findings: Secondary | ICD-10-CM | POA: Diagnosis not present

## 2021-07-23 DIAGNOSIS — R269 Unspecified abnormalities of gait and mobility: Secondary | ICD-10-CM | POA: Diagnosis not present

## 2021-07-23 DIAGNOSIS — E7849 Other hyperlipidemia: Secondary | ICD-10-CM | POA: Diagnosis not present

## 2021-07-23 DIAGNOSIS — F17210 Nicotine dependence, cigarettes, uncomplicated: Secondary | ICD-10-CM | POA: Diagnosis not present

## 2021-07-23 DIAGNOSIS — I4891 Unspecified atrial fibrillation: Secondary | ICD-10-CM | POA: Diagnosis not present

## 2021-07-23 DIAGNOSIS — I10 Essential (primary) hypertension: Secondary | ICD-10-CM | POA: Diagnosis not present

## 2021-10-15 DIAGNOSIS — Z6822 Body mass index (BMI) 22.0-22.9, adult: Secondary | ICD-10-CM | POA: Diagnosis not present

## 2021-10-15 DIAGNOSIS — M25462 Effusion, left knee: Secondary | ICD-10-CM | POA: Diagnosis not present

## 2021-10-15 DIAGNOSIS — M1712 Unilateral primary osteoarthritis, left knee: Secondary | ICD-10-CM | POA: Diagnosis not present

## 2021-10-23 ENCOUNTER — Encounter: Payer: Self-pay | Admitting: Family Medicine

## 2021-10-23 ENCOUNTER — Other Ambulatory Visit: Payer: Self-pay

## 2021-10-23 ENCOUNTER — Ambulatory Visit (INDEPENDENT_AMBULATORY_CARE_PROVIDER_SITE_OTHER): Payer: Medicare Other | Admitting: Family Medicine

## 2021-10-23 ENCOUNTER — Inpatient Hospital Stay (HOSPITAL_COMMUNITY)
Admission: RE | Admit: 2021-10-23 | Discharge: 2021-10-23 | Disposition: A | Discharge status: Home / Self Care | Payer: Medicare Other | Source: Ambulatory Visit | Attending: Family Medicine | Admitting: Family Medicine

## 2021-10-23 ENCOUNTER — Telehealth: Payer: Self-pay | Admitting: Family Medicine

## 2021-10-23 VITALS — BP 112/69 | HR 95 | Temp 98.5°F | Ht 70.0 in | Wt 149.2 lb

## 2021-10-23 DIAGNOSIS — C240 Malignant neoplasm of extrahepatic bile duct: Secondary | ICD-10-CM | POA: Diagnosis not present

## 2021-10-23 DIAGNOSIS — R140 Abdominal distension (gaseous): Secondary | ICD-10-CM

## 2021-10-23 DIAGNOSIS — R935 Abnormal findings on diagnostic imaging of other abdominal regions, including retroperitoneum: Secondary | ICD-10-CM

## 2021-10-23 DIAGNOSIS — K5730 Diverticulosis of large intestine without perforation or abscess without bleeding: Secondary | ICD-10-CM | POA: Diagnosis not present

## 2021-10-23 DIAGNOSIS — Z20822 Contact with and (suspected) exposure to covid-19: Secondary | ICD-10-CM | POA: Diagnosis not present

## 2021-10-23 DIAGNOSIS — R17 Unspecified jaundice: Secondary | ICD-10-CM

## 2021-10-23 DIAGNOSIS — E871 Hypo-osmolality and hyponatremia: Secondary | ICD-10-CM | POA: Diagnosis not present

## 2021-10-23 DIAGNOSIS — K838 Other specified diseases of biliary tract: Secondary | ICD-10-CM | POA: Diagnosis not present

## 2021-10-23 DIAGNOSIS — R160 Hepatomegaly, not elsewhere classified: Secondary | ICD-10-CM

## 2021-10-23 DIAGNOSIS — D649 Anemia, unspecified: Secondary | ICD-10-CM | POA: Diagnosis not present

## 2021-10-23 DIAGNOSIS — K922 Gastrointestinal hemorrhage, unspecified: Secondary | ICD-10-CM | POA: Diagnosis not present

## 2021-10-23 DIAGNOSIS — R195 Other fecal abnormalities: Secondary | ICD-10-CM | POA: Diagnosis not present

## 2021-10-23 DIAGNOSIS — Z79899 Other long term (current) drug therapy: Secondary | ICD-10-CM | POA: Diagnosis not present

## 2021-10-23 DIAGNOSIS — R1319 Other dysphagia: Secondary | ICD-10-CM | POA: Diagnosis not present

## 2021-10-23 DIAGNOSIS — I480 Paroxysmal atrial fibrillation: Secondary | ICD-10-CM | POA: Diagnosis not present

## 2021-10-23 DIAGNOSIS — I471 Supraventricular tachycardia: Secondary | ICD-10-CM | POA: Diagnosis not present

## 2021-10-23 DIAGNOSIS — E860 Dehydration: Secondary | ICD-10-CM | POA: Diagnosis not present

## 2021-10-23 DIAGNOSIS — K859 Acute pancreatitis without necrosis or infection, unspecified: Secondary | ICD-10-CM | POA: Diagnosis not present

## 2021-10-23 DIAGNOSIS — R000 Tachycardia, unspecified: Secondary | ICD-10-CM | POA: Diagnosis not present

## 2021-10-23 DIAGNOSIS — Z7982 Long term (current) use of aspirin: Secondary | ICD-10-CM | POA: Diagnosis not present

## 2021-10-23 DIAGNOSIS — N183 Chronic kidney disease, stage 3 unspecified: Secondary | ICD-10-CM | POA: Diagnosis not present

## 2021-10-23 DIAGNOSIS — I129 Hypertensive chronic kidney disease with stage 1 through stage 4 chronic kidney disease, or unspecified chronic kidney disease: Secondary | ICD-10-CM | POA: Diagnosis not present

## 2021-10-23 DIAGNOSIS — K449 Diaphragmatic hernia without obstruction or gangrene: Secondary | ICD-10-CM | POA: Diagnosis not present

## 2021-10-23 DIAGNOSIS — R748 Abnormal levels of other serum enzymes: Secondary | ICD-10-CM | POA: Diagnosis not present

## 2021-10-23 DIAGNOSIS — K7200 Acute and subacute hepatic failure without coma: Secondary | ICD-10-CM | POA: Diagnosis not present

## 2021-10-23 DIAGNOSIS — K219 Gastro-esophageal reflux disease without esophagitis: Secondary | ICD-10-CM | POA: Diagnosis not present

## 2021-10-23 DIAGNOSIS — D631 Anemia in chronic kidney disease: Secondary | ICD-10-CM | POA: Diagnosis not present

## 2021-10-23 DIAGNOSIS — N179 Acute kidney failure, unspecified: Secondary | ICD-10-CM | POA: Diagnosis not present

## 2021-10-23 DIAGNOSIS — M4856XA Collapsed vertebra, not elsewhere classified, lumbar region, initial encounter for fracture: Secondary | ICD-10-CM | POA: Diagnosis not present

## 2021-10-23 DIAGNOSIS — K921 Melena: Secondary | ICD-10-CM | POA: Diagnosis not present

## 2021-10-23 DIAGNOSIS — D72828 Other elevated white blood cell count: Secondary | ICD-10-CM | POA: Diagnosis not present

## 2021-10-23 DIAGNOSIS — R634 Abnormal weight loss: Secondary | ICD-10-CM | POA: Diagnosis not present

## 2021-10-23 DIAGNOSIS — K831 Obstruction of bile duct: Secondary | ICD-10-CM | POA: Diagnosis not present

## 2021-10-23 DIAGNOSIS — E7800 Pure hypercholesterolemia, unspecified: Secondary | ICD-10-CM | POA: Diagnosis not present

## 2021-10-23 DIAGNOSIS — D62 Acute posthemorrhagic anemia: Secondary | ICD-10-CM | POA: Diagnosis not present

## 2021-10-23 DIAGNOSIS — R1310 Dysphagia, unspecified: Secondary | ICD-10-CM | POA: Diagnosis not present

## 2021-10-23 DIAGNOSIS — I959 Hypotension, unspecified: Secondary | ICD-10-CM | POA: Diagnosis not present

## 2021-10-23 DIAGNOSIS — R7303 Prediabetes: Secondary | ICD-10-CM | POA: Diagnosis not present

## 2021-10-23 DIAGNOSIS — D509 Iron deficiency anemia, unspecified: Secondary | ICD-10-CM | POA: Diagnosis not present

## 2021-10-23 MED ORDER — IOHEXOL 300 MG/ML  SOLN
100.0000 mL | Freq: Once | INTRAMUSCULAR | Status: AC | PRN
  Administered 2021-10-23: 75 mL via INTRAVENOUS

## 2021-10-23 NOTE — Patient Instructions (Signed)
Jaundice, Adult Jaundice is when the skin, the whites of the eyes, and the lining of the mouth and nose (mucous membranes) turn a yellowish color. This is caused by a substance that forms when red blood cells break down (bilirubin). Jaundice is caused by having too much bilirubin in the blood (hyperbilirubinemia). Bilirubin is processed by the liver. Jaundice can be a sign that the liver or the bile system is not working properly. The bile system is made up of the liver, the gallbladder, and the bile ducts. They work together to make, store, and move bile. What are the causes? This condition is caused by a buildup of bilirubin in the blood. An increase in the bilirubin level in the blood can be caused by: Gallstones, if they block the bile ducts. Alcoholic liver disease. Other liver diseases, such as cirrhosis. Cirrhosis is scarring of the liver. Certain cancers that may block the bile ducts, such as pancreatic cancer. Certain infections, such as viral hepatitis. Certain genetic syndromes, such as Gilbert syndrome. Certain medicines, such as acetaminophen, if too much is used. What are the signs or symptoms? Symptoms of this condition include: Yellow color to the skin, the whites of the eyes, or the mucous membranes. Dark brown urine. Light or clay-colored stools (feces). Itchy skin. How is this diagnosed? This condition is diagnosed with medical history, physical exam, blood tests, and imaging tests. You may have additional tests to check for other causes of increased bilirubin levels in your blood or to rule out other possible causes. How is this treated? Treatment for this condition depends on the cause of your jaundice and other underlying conditions. Treatment may include: Stopping the use of certain medicines. Getting fluids through an IV. Taking medicines or using lotions to treat itchy skin. Having surgery, if there is blockage of the bile ducts. Follow these instructions at  home:  Take over-the-counter and prescription medicines only as told by your health care provider. Use skin lotion to relieve itching. Drink enough fluid to keep your urine pale yellow. Do not drink alcohol. Keep all follow-up visits. This is important. Contact a health care provider if: You have a fever. You have swelling or pain in the abdomen. Get help right away if: Your symptoms suddenly get worse. You vomit repeatedly. You vomit blood. You become weak or confused. You have blood in your stool. You become severely dehydrated. Signs of severe dehydration include: A very dry mouth. A severe headache. A rapid, weak pulse. Rapid breathing. Blue lips. These symptoms may represent a serious problem that is an emergency. Do not wait to see if the symptoms will go away. Get medical help right away. Call your local emergency services (911 in the [place] Do not drive yourself to the hospital. Summary Jaundice is a yellowish discoloration of the skin, the whites of the eyes, and the mucous membranes. Jaundice can be a sign that the liver or the bile system is not working properly. Symptoms include a yellow color to the skin or eyes, dark urine, and a change in stool color. Your health care provider will need to do further testing, including blood tests and imaging studies. Get help right away if your symptoms suddenly get worse, you become weak and confused, you vomit, you have blood in your stool, you have a severe headache, or you become dehydrated. This information is not intended to replace advice given to you by your health care provider. Make sure you discuss any questions you have with your health care   provider. Document Revised: 03/13/2021 Document Reviewed: 03/13/2021 Elsevier Patient Education  2022 [employer]  

## 2021-10-23 NOTE — Telephone Encounter (Signed)
Spoke with pt and wife concering CT results. Aware ERCP would be ordered and to report any new or worsening symptoms

## 2021-10-23 NOTE — Progress Notes (Signed)
New Patient Office Visit  Subjective:  Patient ID: Terry Hernandez, male    DOB: Oct 19, 1934  Age: 87 y.o. MRN: [id number]  CC:  Chief Complaint  Patient presents with   New Patient (Initial Visit)    HPI Terry Hernandez presents to establish care. Here with his son today. They are concerned about jaundice. This was first noticed by his son last week. It has worsened over the last week. He has had a decreased appetite for the last few months, worse over the last weeks. He has lost 5 lbs in the last 10 days. There are some days that he only eats a banana. Today he has only had a bowl of cereal. He has felt weaker than usual. His stool was white/gray last week, then was black and tarry, and now it looks normal today. He has had some have trouble swallowing solid foods sometimes. This has been an on going issue for him but seems to be worse than usual. Denies trouble swallowing liquids. He denies diarrhea. He denies frank blood or mucus in his stool. Denies constipation, straining, or pain with bowel movements. He denies abdominal pain, fever, nausea, vomiting, confusion, or recent viral illness. Denies edema or swelling.   He takes about 1000-2000 mg of tylenol most days. He denies alcohol use.   He denies a history of liver or gallbladder disease.   Past Medical History:  Diagnosis Date   Arthritis    GERD (gastroesophageal reflux disease)    PMH   Glucose intolerance (impaired glucose tolerance)    High cholesterol    History of kidney stones    HTN (hypertension)    Hyperlipidemia    Lumbar stenosis with neurogenic claudication    Pneumonia    Subcutaneous mass    Wears glasses     Past Surgical History:  Procedure Laterality Date   APPENDECTOMY     COLONOSCOPY W/ BIOPSIES AND POLYPECTOMY     LUMBAR LAMINECTOMY/DECOMPRESSION MICRODISCECTOMY Bilateral 03/18/2020   Procedure: Bilateral Lumbar three-four Lumbar four-five Laminotomy/foraminotomy;  Surgeon: Kristeen Miss, MD;   Location: [facility];  Service: Neurosurgery;  Laterality: Bilateral;   MASS EXCISION Right 03/18/2020   Procedure: Excision of a Right gluteal subcutaneous mass;  Surgeon: Kristeen Miss, MD;  Location: [facility];  Service: Neurosurgery;  Laterality: Right;   MULTIPLE TOOTH EXTRACTIONS      Family History  Problem Relation Age of Onset   Cancer Mother    Stroke Father    Cancer Son        lung    Social History   Socioeconomic History   Marital status: Married    Spouse name: Not on file   Number of children: 2   Years of education: Not on file   Highest education level: Not on file  Occupational History   Not on file  Tobacco Use   Smoking status: Never   Smokeless tobacco: Never  Vaping Use   Vaping Use: Never used  Substance and Sexual Activity   Alcohol use: Never   Drug use: Never   Sexual activity: Not Currently  Other Topics Concern   Not on file  Social History Narrative   Not on file   Social Determinants of Health   Financial Resource Strain: Not on file  Food Insecurity: Not on file  Transportation Needs: Not on file  Physical Activity: Not on file  Stress: Not on file  Social Connections: Not on file  Intimate Partner Violence: Not on file  ROS Review of Systems As per HPI.   Objective:   Today's Vitals: BP 112/69   Pulse 95   Temp 98.5 F (36.9 C) (Temporal)   Ht 5\' 10"  (1.778 m)   Wt 149 lb 4 oz (67.7 kg)   BMI 21.42 kg/m   Physical Exam Vitals and nursing note reviewed.  Constitutional:      General: He is not in acute distress.    Appearance: He is not diaphoretic.  Eyes:     General: Scleral icterus present.  Cardiovascular:     Rate and Rhythm: Normal rate and regular rhythm.     Heart sounds: Normal heart sounds. No murmur heard. Pulmonary:     Effort: Pulmonary effort is normal. No respiratory distress.     Breath sounds: Normal breath sounds.  Abdominal:     General: Bowel sounds are normal.     Palpations: Abdomen is soft.  There is hepatomegaly.     Tenderness: There is no abdominal tenderness. There is no guarding or rebound. Negative signs include Murphy's sign and McBurney's sign.  Skin:    Coloration: Skin is jaundiced (head, trunk).  Neurological:     Mental Status: He is alert and oriented to person, place, and time.     Motor: Weakness (generalized) present.     Gait: Gait abnormal (uses crutches).  Psychiatric:        Mood and Affect: Mood normal.        Behavior: Behavior normal.    Assessment & Plan:   Terry Hernandez was seen today for new patient (initial visit).  Diagnoses and all orders for this visit:  Jaundice Abdominal distension (gaseous) Liver enlargement New onset, unknown etiology. Labs pending as below. Stat CT ordered, patient will be worked in this evening. Will notify patient of results and determine plan of care pending results.  -     CT Abdomen Pelvis W Contrast; Future -     Acute Viral Hepatitis (HAV, HBV, HCV) -     Hepatic Function Panel -     CBC with Differential -     Amylase -     Lipase  Follow-up: Will determine pending CT and lab results.   The patient indicates understanding of these issues and agrees with the plan.  [name], FNP

## 2021-10-24 ENCOUNTER — Encounter (HOSPITAL_COMMUNITY): Payer: Self-pay | Admitting: Emergency Medicine

## 2021-10-24 ENCOUNTER — Inpatient Hospital Stay (HOSPITAL_COMMUNITY)
Admission: EM | Admit: 2021-10-24 | Discharge: 2021-10-29 | DRG: 435 | Disposition: A | Discharge status: Home / Self Care | Payer: Medicare Other | Attending: Internal Medicine | Admitting: Internal Medicine

## 2021-10-24 ENCOUNTER — Other Ambulatory Visit: Payer: Self-pay

## 2021-10-24 DIAGNOSIS — Z20822 Contact with and (suspected) exposure to covid-19: Secondary | ICD-10-CM | POA: Diagnosis present

## 2021-10-24 DIAGNOSIS — R1310 Dysphagia, unspecified: Secondary | ICD-10-CM | POA: Diagnosis present

## 2021-10-24 DIAGNOSIS — D649 Anemia, unspecified: Secondary | ICD-10-CM | POA: Diagnosis not present

## 2021-10-24 DIAGNOSIS — K921 Melena: Secondary | ICD-10-CM | POA: Diagnosis present

## 2021-10-24 DIAGNOSIS — E7800 Pure hypercholesterolemia, unspecified: Secondary | ICD-10-CM | POA: Diagnosis present

## 2021-10-24 DIAGNOSIS — E871 Hypo-osmolality and hyponatremia: Secondary | ICD-10-CM | POA: Diagnosis present

## 2021-10-24 DIAGNOSIS — K5730 Diverticulosis of large intestine without perforation or abscess without bleeding: Secondary | ICD-10-CM | POA: Diagnosis present

## 2021-10-24 DIAGNOSIS — C240 Malignant neoplasm of extrahepatic bile duct: Principal | ICD-10-CM | POA: Diagnosis present

## 2021-10-24 DIAGNOSIS — Z7982 Long term (current) use of aspirin: Secondary | ICD-10-CM | POA: Diagnosis not present

## 2021-10-24 DIAGNOSIS — R195 Other fecal abnormalities: Secondary | ICD-10-CM | POA: Diagnosis not present

## 2021-10-24 DIAGNOSIS — D631 Anemia in chronic kidney disease: Secondary | ICD-10-CM | POA: Diagnosis present

## 2021-10-24 DIAGNOSIS — Z419 Encounter for procedure for purposes other than remedying health state, unspecified: Secondary | ICD-10-CM

## 2021-10-24 DIAGNOSIS — I471 Supraventricular tachycardia: Secondary | ICD-10-CM | POA: Diagnosis not present

## 2021-10-24 DIAGNOSIS — K219 Gastro-esophageal reflux disease without esophagitis: Secondary | ICD-10-CM | POA: Diagnosis present

## 2021-10-24 DIAGNOSIS — R7303 Prediabetes: Secondary | ICD-10-CM | POA: Diagnosis present

## 2021-10-24 DIAGNOSIS — N183 Chronic kidney disease, stage 3 unspecified: Secondary | ICD-10-CM | POA: Diagnosis present

## 2021-10-24 DIAGNOSIS — E860 Dehydration: Secondary | ICD-10-CM | POA: Diagnosis present

## 2021-10-24 DIAGNOSIS — I491 Atrial premature depolarization: Secondary | ICD-10-CM | POA: Diagnosis present

## 2021-10-24 DIAGNOSIS — D72828 Other elevated white blood cell count: Secondary | ICD-10-CM | POA: Diagnosis present

## 2021-10-24 DIAGNOSIS — D539 Nutritional anemia, unspecified: Secondary | ICD-10-CM | POA: Diagnosis present

## 2021-10-24 DIAGNOSIS — K449 Diaphragmatic hernia without obstruction or gangrene: Secondary | ICD-10-CM | POA: Diagnosis present

## 2021-10-24 DIAGNOSIS — I959 Hypotension, unspecified: Secondary | ICD-10-CM | POA: Diagnosis present

## 2021-10-24 DIAGNOSIS — R748 Abnormal levels of other serum enzymes: Secondary | ICD-10-CM | POA: Diagnosis present

## 2021-10-24 DIAGNOSIS — D72829 Elevated white blood cell count, unspecified: Secondary | ICD-10-CM | POA: Diagnosis present

## 2021-10-24 DIAGNOSIS — I10 Essential (primary) hypertension: Secondary | ICD-10-CM | POA: Diagnosis present

## 2021-10-24 DIAGNOSIS — I480 Paroxysmal atrial fibrillation: Secondary | ICD-10-CM | POA: Diagnosis present

## 2021-10-24 DIAGNOSIS — N179 Acute kidney failure, unspecified: Secondary | ICD-10-CM | POA: Diagnosis present

## 2021-10-24 DIAGNOSIS — K7200 Acute and subacute hepatic failure without coma: Secondary | ICD-10-CM | POA: Diagnosis present

## 2021-10-24 DIAGNOSIS — K831 Obstruction of bile duct: Secondary | ICD-10-CM | POA: Diagnosis present

## 2021-10-24 DIAGNOSIS — R634 Abnormal weight loss: Secondary | ICD-10-CM | POA: Diagnosis not present

## 2021-10-24 DIAGNOSIS — R17 Unspecified jaundice: Secondary | ICD-10-CM | POA: Diagnosis present

## 2021-10-24 DIAGNOSIS — R1319 Other dysphagia: Secondary | ICD-10-CM | POA: Diagnosis not present

## 2021-10-24 DIAGNOSIS — I129 Hypertensive chronic kidney disease with stage 1 through stage 4 chronic kidney disease, or unspecified chronic kidney disease: Secondary | ICD-10-CM | POA: Diagnosis present

## 2021-10-24 DIAGNOSIS — D62 Acute posthemorrhagic anemia: Secondary | ICD-10-CM | POA: Diagnosis present

## 2021-10-24 DIAGNOSIS — K922 Gastrointestinal hemorrhage, unspecified: Secondary | ICD-10-CM

## 2021-10-24 DIAGNOSIS — R739 Hyperglycemia, unspecified: Secondary | ICD-10-CM | POA: Diagnosis present

## 2021-10-24 DIAGNOSIS — M48061 Spinal stenosis, lumbar region without neurogenic claudication: Secondary | ICD-10-CM | POA: Diagnosis present

## 2021-10-24 DIAGNOSIS — E785 Hyperlipidemia, unspecified: Secondary | ICD-10-CM | POA: Diagnosis present

## 2021-10-24 DIAGNOSIS — Z79899 Other long term (current) drug therapy: Secondary | ICD-10-CM | POA: Diagnosis not present

## 2021-10-24 DIAGNOSIS — E782 Mixed hyperlipidemia: Secondary | ICD-10-CM | POA: Diagnosis present

## 2021-10-24 LAB — COMPREHENSIVE METABOLIC PANEL
ALT: 537 U/L — ABNORMAL HIGH (ref 0–44)
AST: 474 U/L — ABNORMAL HIGH (ref 15–41)
Albumin: 2.8 g/dL — ABNORMAL LOW (ref 3.5–5.0)
Alkaline Phosphatase: 1524 U/L — ABNORMAL HIGH (ref 38–126)
Anion gap: 11 (ref 5–15)
BUN: 50 mg/dL — ABNORMAL HIGH (ref 8–23)
CO2: 20 mmol/L — ABNORMAL LOW (ref 22–32)
Calcium: 8.5 mg/dL — ABNORMAL LOW (ref 8.9–10.3)
Chloride: 103 mmol/L (ref 98–111)
Creatinine, Ser: 1.84 mg/dL — ABNORMAL HIGH (ref 0.61–1.24)
GFR, Estimated: 36 mL/min — ABNORMAL LOW (ref >60)
Glucose, Bld: 183 mg/dL — ABNORMAL HIGH (ref 70–99)
Potassium: 4.7 mmol/L (ref 3.5–5.1)
Sodium: 134 mmol/L — ABNORMAL LOW (ref 135–145)
Total Bilirubin: 10 mg/dL — ABNORMAL HIGH (ref 0.3–1.2)
Total Protein: 6.7 g/dL (ref 6.5–8.1)

## 2021-10-24 LAB — ACUTE VIRAL HEPATITIS (HAV, HBV, HCV)
HCV Ab: <0.1 s/co ratio (ref 0.0–0.9)
Hep A IgM: NEGATIVE
Hep B C IgM: NEGATIVE
Hepatitis B Surface Ag: NEGATIVE

## 2021-10-24 LAB — CBC WITH DIFFERENTIAL/PLATELET
Basophils Absolute: 0 10*3/uL (ref 0.0–0.2)
Basos: 0 %
EOS (ABSOLUTE): 0.1 10*3/uL (ref 0.0–0.4)
Eos: 1 %
Hematocrit: 25.9 % — ABNORMAL LOW (ref 37.5–51.0)
Hemoglobin: 9.2 g/dL — ABNORMAL LOW (ref 13.0–17.7)
Immature Grans (Abs): 0.1 10*3/uL (ref 0.0–0.1)
Immature Granulocytes: 1 %
Lymphocytes Absolute: 1.1 10*3/uL (ref 0.7–3.1)
Lymphs: 9 %
MCH: 34.5 pg — ABNORMAL HIGH (ref 26.6–33.0)
MCHC: 35.5 g/dL (ref 31.5–35.7)
MCV: 97 fL (ref 79–97)
Monocytes Absolute: 1.2 10*3/uL — ABNORMAL HIGH (ref 0.1–0.9)
Monocytes: 10 %
Neutrophils Absolute: 10 10*3/uL — ABNORMAL HIGH (ref 1.4–7.0)
Neutrophils: 79 %
Platelets: 388 10*3/uL (ref 150–450)
RBC: 2.67 x10E6/uL — CL (ref 4.14–5.80)
RDW: 13.6 % (ref 11.6–15.4)
WBC: 12.4 10*3/uL — ABNORMAL HIGH (ref 3.4–10.8)

## 2021-10-24 LAB — IRON AND TIBC
Iron: 89 ug/dL (ref 45–182)
Saturation Ratios: 32 % (ref 17.9–39.5)
TIBC: 276 ug/dL (ref 250–450)
UIBC: 187 ug/dL

## 2021-10-24 LAB — URINALYSIS, ROUTINE W REFLEX MICROSCOPIC
Glucose, UA: NEGATIVE mg/dL
Hgb urine dipstick: NEGATIVE
Ketones, ur: NEGATIVE mg/dL
Leukocytes,Ua: NEGATIVE
Nitrite: NEGATIVE
Protein, ur: 100 mg/dL — AB
Specific Gravity, Urine: 1.032 — ABNORMAL HIGH (ref 1.005–1.030)
pH: 5 (ref 5.0–8.0)

## 2021-10-24 LAB — HEMOGLOBIN A1C
Hgb A1c MFr Bld: 6.5 % — ABNORMAL HIGH (ref 4.8–5.6)
Mean Plasma Glucose: 139.85 mg/dL

## 2021-10-24 LAB — CBC
HCT: 27.3 % — ABNORMAL LOW (ref 39.0–52.0)
Hemoglobin: 9.2 g/dL — ABNORMAL LOW (ref 13.0–17.0)
MCH: 35 pg — ABNORMAL HIGH (ref 26.0–34.0)
MCHC: 33.7 g/dL (ref 30.0–36.0)
MCV: 103.8 fL — ABNORMAL HIGH (ref 80.0–100.0)
Platelets: 370 10*3/uL (ref 150–400)
RBC: 2.63 MIL/uL — ABNORMAL LOW (ref 4.22–5.81)
RDW: 18.9 % — ABNORMAL HIGH (ref 11.5–15.5)
WBC: 13.5 10*3/uL — ABNORMAL HIGH (ref 4.0–10.5)
nRBC: 0 % (ref 0.0–0.2)

## 2021-10-24 LAB — HEPATIC FUNCTION PANEL
ALT: 381 IU/L — ABNORMAL HIGH (ref 0–44)
AST: 512 IU/L (ref 0–40)
Albumin: 3 g/dL — ABNORMAL LOW (ref 3.6–4.6)
Alkaline Phosphatase: 1667 IU/L (ref 44–121)
Bilirubin Total: 9 mg/dL — ABNORMAL HIGH (ref 0.0–1.2)
Bilirubin, Direct: 7.51 mg/dL — ABNORMAL HIGH (ref 0.00–0.40)
Total Protein: 6.1 g/dL (ref 6.0–8.5)

## 2021-10-24 LAB — SAMPLE TO BLOOD BANK

## 2021-10-24 LAB — PROTIME-INR
INR: 1.1 (ref 0.8–1.2)
Prothrombin Time: 14.1 seconds (ref 11.4–15.2)

## 2021-10-24 LAB — HCV INTERPRETATION

## 2021-10-24 LAB — FOLATE: Folate: 20.3 ng/mL (ref >5.9)

## 2021-10-24 LAB — AMYLASE: Amylase: 78 U/L (ref 31–110)

## 2021-10-24 LAB — RETICULOCYTES
Immature Retic Fract: 9 % (ref 2.3–15.9)
RBC.: 2.47 MIL/uL — ABNORMAL LOW (ref 4.22–5.81)
Retic Count, Absolute: 45.7 10*3/uL (ref 19.0–186.0)
Retic Ct Pct: 1.9 % (ref 0.4–3.1)

## 2021-10-24 LAB — VITAMIN B12: Vitamin B-12: 2206 pg/mL — ABNORMAL HIGH (ref 180–914)

## 2021-10-24 LAB — POC OCCULT BLOOD, ED: Fecal Occult Bld: POSITIVE — AB

## 2021-10-24 LAB — LIPASE: Lipase: 78 U/L (ref 13–78)

## 2021-10-24 LAB — RESP PANEL BY RT-PCR (FLU A&B, COVID) ARPGX2
Influenza A by PCR: NEGATIVE
Influenza B by PCR: NEGATIVE
SARS Coronavirus 2 by RT PCR: NEGATIVE

## 2021-10-24 LAB — FERRITIN: Ferritin: 499 ng/mL — ABNORMAL HIGH (ref 24–336)

## 2021-10-24 MED ORDER — ONDANSETRON HCL 4 MG PO TABS: 4.0000 mg | ORAL_TABLET | Freq: Four times a day (QID) | ORAL | Status: DC | PRN

## 2021-10-24 MED ORDER — SODIUM CHLORIDE 0.9 % IV BOLUS
500.0000 mL | Freq: Once | INTRAVENOUS | Status: AC
  Administered 2021-10-24: 500 mL via INTRAVENOUS

## 2021-10-24 MED ORDER — PIPERACILLIN-TAZOBACTAM 3.375 G IVPB 30 MIN
3.3750 g | Freq: Once | INTRAVENOUS | Status: AC
  Administered 2021-10-24: 3.375 g via INTRAVENOUS
  Filled 2021-10-24: qty 50

## 2021-10-24 MED ORDER — ONDANSETRON HCL 4 MG/2ML IJ SOLN: 4.0000 mg | Freq: Four times a day (QID) | INTRAMUSCULAR | Status: DC | PRN

## 2021-10-24 MED ORDER — SODIUM CHLORIDE 0.9 % IV SOLN: INTRAVENOUS | Status: DC

## 2021-10-24 MED ORDER — PANTOPRAZOLE SODIUM 40 MG IV SOLR
40.0000 mg | Freq: Once | INTRAVENOUS | Status: AC
  Administered 2021-10-24: 40 mg via INTRAVENOUS
  Filled 2021-10-24: qty 40

## 2021-10-24 MED ORDER — FENTANYL CITRATE PF 50 MCG/ML IJ SOSY: 12.5000 ug | PREFILLED_SYRINGE | INTRAMUSCULAR | Status: DC | PRN

## 2021-10-24 MED ORDER — PANTOPRAZOLE SODIUM 40 MG IV SOLR: 40.0000 mg | INTRAVENOUS | Status: DC

## 2021-10-24 MED ORDER — PIPERACILLIN-TAZOBACTAM 3.375 G IVPB
3.3750 g | Freq: Three times a day (TID) | INTRAVENOUS | Status: DC
  Administered 2021-10-24 – 2021-10-27 (×8): 3.375 g via INTRAVENOUS
  Filled 2021-10-24 (×9): qty 50

## 2021-10-24 NOTE — ED Provider Notes (Signed)
[facility] EMERGENCY DEPARTMENT Provider Note   CSN: [id number] Arrival date & time: 10/24/21  2947     History Chief Complaint  Patient presents with   Abnormal Lab    PCP called from [place] that pt was in acute liver failure.  Pt denies any pain.      Terry Hernandez is a 87 y.o. male.   Abnormal Lab Patient presents with hepatic jaundice.  States that he has been feeling bad for about a week.  Decreased oral intake.  Has no desire to eat.  Seen by PCP and had blood work showing elevated LFTs particularly an obstructive pattern.  He is clearly jaundiced.  CT scan done and showed biliary obstruction likely at the level of the ampulla.  Had had some gray stool and now having some black stool.  Hemoglobin was down yesterday but compared to a year to prior.  Has not seen GI in the past.  Has had some food stuck in his esophagus at times.  Reportedly sent in for ERCP.  Has lost around 4 pounds over the last week.    Past Medical History:  Diagnosis Date   Arthritis    GERD (gastroesophageal reflux disease)    PMH   Glucose intolerance (impaired glucose tolerance)    High cholesterol    History of kidney stones    HTN (hypertension)    Hyperlipidemia    Lumbar stenosis with neurogenic claudication    Pneumonia    Subcutaneous mass    Wears glasses     Patient Active Problem List   Diagnosis Date Noted   Lumbar stenosis with neurogenic claudication 03/18/2020   Lumbar spinal stenosis 03/18/2020   PREMATURE ATRIAL CONTRACTIONS 09/04/2009   PALPITATIONS 09/04/2009   HYPERLIPIDEMIA 09/03/2009    Past Surgical History:  Procedure Laterality Date   APPENDECTOMY     COLONOSCOPY W/ BIOPSIES AND POLYPECTOMY     LUMBAR LAMINECTOMY/DECOMPRESSION MICRODISCECTOMY Bilateral 03/18/2020   Procedure: Bilateral Lumbar three-four Lumbar four-five Laminotomy/foraminotomy;  Surgeon: Kristeen Miss, MD;  Location: [place];  Service: Neurosurgery;  Laterality: Bilateral;    MASS EXCISION Right 03/18/2020   Procedure: Excision of a Right gluteal subcutaneous mass;  Surgeon: Kristeen Miss, MD;  Location: [place];  Service: Neurosurgery;  Laterality: Right;   MULTIPLE TOOTH EXTRACTIONS         Family History  Problem Relation Age of Onset   Cancer Mother    Stroke Father    Cancer Son        lung    Social History   Tobacco Use   Smoking status: Never   Smokeless tobacco: Never  Vaping Use   Vaping Use: Never used  Substance Use Topics   Alcohol use: Never   Drug use: Never    Home Medications Prior to Admission medications   Medication Sig Start Date End Date Taking? Authorizing Provider  acetaminophen (TYLENOL) 500 MG tablet Take 1,000 mg by mouth 4 (four) times daily.    [provider]  Ascorbic Acid (VITAMIN C PO) Take by mouth.    [provider]  aspirin EC 81 MG tablet Take 81 mg by mouth at bedtime.     [provider]  atorvastatin (LIPITOR) 20 MG tablet Take 20 mg by mouth at bedtime.     [provider]  B Complex Vitamins (VITAMIN B COMPLEX PO) Take by mouth.    [provider]  losartan (COZAAR) 100 MG tablet Take 100 mg by  mouth at bedtime.  02/05/20   [provider]  methocarbamol (ROBAXIN) 500 MG tablet Take 1 tablet (500 mg total) by mouth every 6 (six) hours as needed for muscle spasms. 03/19/20   Kristeen Miss, MD  Multiple Vitamin (MULTIVITAMIN) tablet Take 1 tablet by mouth at bedtime.     [provider]  silodosin (RAPAFLO) 8 MG CAPS capsule Take 1 capsule (8 mg total) by mouth daily with breakfast. 05/15/21   Irine Seal, MD  traMADol (ULTRAM) 50 MG tablet Take 50 mg by mouth 4 (four) times daily. 03/05/20   [provider]    Allergies    Cephalexin  Review of Systems   Review of Systems  Constitutional:  Positive for appetite change, fatigue and unexpected weight change.  HENT:  Negative for congestion.   Respiratory:  Negative for shortness of breath.    Cardiovascular:  Negative for chest pain.  Gastrointestinal:  Positive for blood in stool.  Genitourinary:  Negative for flank pain.  Skin:  Positive for color change.  Neurological:  Negative for weakness.  Psychiatric/Behavioral:  Negative for confusion.    Physical Exam Updated Vital Signs BP (!) 105/53   Pulse (!) 110   Temp 98.8 F (37.1 C) (Oral)   Resp 17   Ht 5\' 10"  (1.778 m)   Wt 67.7 kg   SpO2 99%   BMI 21.42 kg/m   Physical Exam Vitals and nursing note reviewed.  HENT:     Head: Normocephalic.  Eyes:     Pupils: Pupils are equal, round, and reactive to light.  Cardiovascular:     Rate and Rhythm: Regular rhythm.  Pulmonary:     Effort: Pulmonary effort is normal.  Abdominal:     Tenderness: There is no abdominal tenderness.  Musculoskeletal:     Cervical back: Neck supple.  Skin:    General: Skin is warm.     Capillary Refill: Capillary refill takes less than 2 seconds.     Coloration: Skin is jaundiced.  Neurological:     Mental Status: He is alert and oriented to person, place, and time.    ED Results / Procedures / Treatments   Labs (all labs ordered are listed, but only abnormal results are displayed) Labs Reviewed  COMPREHENSIVE METABOLIC PANEL - Abnormal; Notable for the following components:      Result Value   Sodium 134 (*)    CO2 20 (*)    Glucose, Bld 183 (*)    BUN 50 (*)    Creatinine, Ser 1.84 (*)    Calcium 8.5 (*)    Albumin 2.8 (*)    AST 474 (*)    ALT 537 (*)    Alkaline Phosphatase 1,524 (*)    Total Bilirubin 10.0 (*)    GFR, Estimated 36 (*)    All other components within normal limits  CBC - Abnormal; Notable for the following components:   WBC 13.5 (*)    RBC 2.63 (*)    Hemoglobin 9.2 (*)    HCT 27.3 (*)    MCV 103.8 (*)    MCH 35.0 (*)    RDW 18.9 (*)    All other components within normal limits  POC OCCULT BLOOD, ED - Abnormal; Notable for the following components:   Fecal Occult Bld POSITIVE (*)    All  other components within normal limits  RESP PANEL BY RT-PCR (FLU A&B, COVID) ARPGX2  PROTIME-INR  URINALYSIS, ROUTINE W REFLEX MICROSCOPIC  SAMPLE TO BLOOD  BANK    EKG EKG Interpretation  Date/Time:  Friday October 24 2021 09:31:08 EST Ventricular Rate:  102 PR Interval:  90 QRS Duration: 82 QT Interval:  340 QTC Calculation: 443 R Axis:   23 Text Interpretation: Sinus tachycardia with irregular rate Nonspecific T abnormalities, lateral leads Confirmed by Davonna Belling [phone]) on 10/24/2021 10:25:26 AM  Radiology CT Abdomen Pelvis W Contrast  Result Date: 10/23/2021 CLINICAL DATA:  Abdominal distension, jaundice EXAM: CT ABDOMEN AND PELVIS WITH CONTRAST TECHNIQUE: Multidetector CT imaging of the abdomen and pelvis was performed using the standard protocol following bolus administration of intravenous contrast. CONTRAST:  54mL OMNIPAQUE IOHEXOL 300 MG/ML  SOLN COMPARISON:  [facility] CT abdomen/pelvis dated 04/16/2020 FINDINGS: Lower chest: Mild subpleural reticulation at the lung bases. Hepatobiliary: Liver is within normal limits. Gallbladder sludge (series 2/image 29), without associated inflammatory changes. Mild intrahepatic and moderate extrahepatic ductal dilatation, new from the prior. Common duct measures 2.4 cm centrally (coronal image 42) and sharply tapers at the ampulla (coronal image 43), without convincing obstructing mass on CT. Pancreas: Within normal limits. Note is made of peripancreatic inflammatory changes on the prior, which are improved. Spleen: Within normal limits. Adrenals/Urinary Tract: Adrenal glands are within normal limits. Kidneys are within normal limits.  No hydronephrosis. Bladder is within normal limits. Stomach/Bowel: Stomach is within normal limits. Prior inflammatory changes along the duodenal bulb (at the site of possible prior duodenal ulcer) have resolved. No evidence of bowel obstruction. Appendix is not discretely visualized. Mild sigmoid  diverticulosis, without evidence of diverticulitis. Vascular/Lymphatic: No evidence of abdominal aortic aneurysm. Atherosclerotic calcifications of the abdominal aorta and branch vessels. No suspicious abdominopelvic lymphadenopathy. Reproductive: Prostate is mildly enlarged and indents the base of the bladder, suggesting BPH. Other: No abdominopelvic ascites. Musculoskeletal: Degenerative changes of the visualized thoracolumbar spine. Moderate compression fracture deformity at L5, chronic. IMPRESSION: Mild intrahepatic and moderate extrahepatic occluded 64 new from the prior. Common duct measures 2.4 cm and abruptly tapers at the ampulla, without convincing obstructing mass on CT. This appearance favors a distal CBD stricture, possibly on the basis of prior pancreatitis/duodenitis given the prior CT. ERCP is suggested. Gallbladder sludge, without associated inflammatory changes. Electronically Signed   By: Julian Hy M.D.   On: 10/23/2021 19:58    Procedures Procedures   Medications Ordered in ED Medications  pantoprazole (PROTONIX) injection 40 mg (has no administration in time range)  sodium chloride 0.9 % bolus 500 mL (500 mLs Intravenous New Bag/Given 10/24/21 1104)    ED Course  I have reviewed the triage vital signs and the nursing notes.  Pertinent labs & imaging results that were available during my care of the patient were reviewed by me and considered in my medical decision making (see chart for details).    MDM Rules/Calculators/A&P                           Patient sent in for obstructive jaundice.  CT scan as an outpatient showed gallbladder sludge and a narrowing of the common duct at the ampulla.  Also intra and extrahepatic biliary obstruction.  Mild anemia compared to a year ago.  Hemoglobin stable from yesterday however.  Guaiac positive.  Has had decreased oral intake.  Sent in for likely ERCP, however discussed with Dr. Gala Romney and that cannot be done up here until  least Monday.  Discussed with Nicoletta Ba from [facility] gastroenterology.  Accepted in transfer at [facility] since procedure not  available here.  Will discuss with hospitalist for transfer to [facility].  Appears overall hemodynamically stable.  Mild tachycardia and blood pressures been on the lower side.  We will give a small fluid bolus.  Do not think this is a hemorrhagic shock.  We will give IV Protonix.  Likely will not get ERCP today but likely will be done tomorrow. Final Clinical Impression(s) / ED Diagnoses Final diagnoses:  Obstructive jaundice  Gastrointestinal hemorrhage, unspecified gastrointestinal hemorrhage type    Rx / DC Orders ED Discharge Orders     None        Davonna Belling, MD 10/24/21 1110

## 2021-10-24 NOTE — H&P (Signed)
History and Physical  [facility]  Terry Hernandez SWH:[id number] DOB: [birth year] DOA: 10/24/2021  PCP: [name], FNP  Patient coming from: Home  Level of care: Telemetry Medical  I have personally briefly reviewed patient's old medical records in [place]  Chief Complaint: abnormal labs / abnormal weight loss / anorexia   HPI: Terry Hernandez is a 87 y.o. male with medical history significant for prior history of pancreatitis, last colonoscopy about 10 years ago, GERD, OA, HTN, nephrolithiasis, hyperlipidemia, prediabetes, lumbar stenosis with neurogenic claudication recently established care at the [place] [facility] on 10/23/2021.  He complained about a 5 pound weight loss over the past 10 days.  He has had decreased appetite for the last few months but much worse over the last several weeks.  He is having intermittent black tarry stools.  This started about 5 days ago.  He has intermittent difficulty with swallowing food.  His last bowel movement was yesterday.  He denies constipation and diarrhea.  He reports that he started noticing yellowing of the skin over the past several days that continues to get worse.  He had labs done at the family medicine clinic on 10/23/2021 and was sent for a stat CT abdomen.  He was noticed to have abdominal distention and concern for hepatomegaly on clinical exam.  His initial total bilirubin test was markedly elevated at 9.0 with an ALT elevated at 381 and AST of 512.  Alkaline phosphatase was elevated at 1667.  His WBC was elevated at 12.4 and hemoglobin was 9.2.  Hepatitis panel came back negative.  The CT scan was suspicious for is CBD stricture.  With these findings the patient was sent to the emergency department for further evaluation.  ED Course: Patient reported that he feels well no fever or chills no nausea or vomiting.  He has ongoing poor appetite.  He reports noticing blood in his stool.  His last bowel  movement was yesterday.  He reports abdominal distention.  His labs have progressively worsened from yesterday with total bilirubin now at 10.0.  His ALT is 537, AST 474.  Alkaline phosphatase is 1524.  His creatinine is 1.84.  His BUN is 50.  His glucose is 183.  His WBC is 13.5 hemoglobin 9.2.  GI was consulted regarding biliary obstruction and recommended ERCP however cannot be done at [facility] and GI at [facility] was consulted and [facility] has agreed to consult on patient and [facility] and consider ERCP later today or tomorrow.  Patient is n.p.o. at this time.  Patient noted to have soft blood pressure on arrival however after bolus of IV fluids it has improved.    Review of Systems: Review of Systems  Constitutional:  Positive for malaise/fatigue and weight loss. Negative for chills, diaphoresis and fever.  HENT: Negative.    Eyes: Negative.   Respiratory: Negative.    Cardiovascular: Negative.   Gastrointestinal:  Positive for abdominal pain, blood in stool, heartburn, melena and nausea. Negative for constipation and diarrhea.  Genitourinary: Negative.   Musculoskeletal: Negative.   Skin:  Positive for rash.  Neurological: Negative.   Endo/Heme/Allergies: Negative.   Psychiatric/Behavioral: Negative.    All other systems reviewed and are negative.   Past Medical History:  Diagnosis Date   Arthritis    GERD (gastroesophageal reflux disease)    PMH   Glucose intolerance (impaired glucose tolerance)    High cholesterol    History of kidney stones  HTN (hypertension)    Hyperlipidemia    Lumbar stenosis with neurogenic claudication    Pneumonia    Subcutaneous mass    Wears glasses     Past Surgical History:  Procedure Laterality Date   APPENDECTOMY     COLONOSCOPY W/ BIOPSIES AND POLYPECTOMY     LUMBAR LAMINECTOMY/DECOMPRESSION MICRODISCECTOMY Bilateral 03/18/2020   Procedure: Bilateral Lumbar three-four Lumbar four-five Laminotomy/foraminotomy;  Surgeon: Kristeen Miss,  MD;  Location: [place];  Service: Neurosurgery;  Laterality: Bilateral;   MASS EXCISION Right 03/18/2020   Procedure: Excision of a Right gluteal subcutaneous mass;  Surgeon: Kristeen Miss, MD;  Location: [place];  Service: Neurosurgery;  Laterality: Right;   MULTIPLE TOOTH EXTRACTIONS       reports that he has never smoked. He has never used smokeless tobacco. He reports that he does not drink alcohol and does not use drugs.  Allergies  Allergen Reactions   Cephalexin Rash    REACTION: rash    Family History  Problem Relation Age of Onset   Cancer Mother    Stroke Father    Cancer Son        lung    Prior to Admission medications   Medication Sig Start Date End Date Taking? Authorizing Provider  acetaminophen (TYLENOL) 500 MG tablet Take 1,000 mg by mouth 4 (four) times daily.    [provider]  Ascorbic Acid (VITAMIN C PO) Take by mouth.    [provider]  aspirin EC 81 MG tablet Take 81 mg by mouth at bedtime.     [provider]  atorvastatin (LIPITOR) 20 MG tablet Take 20 mg by mouth at bedtime.     [provider]  B Complex Vitamins (VITAMIN B COMPLEX PO) Take by mouth.    [provider]  losartan (COZAAR) 100 MG tablet Take 100 mg by mouth at bedtime.  02/05/20   [provider]  methocarbamol (ROBAXIN) 500 MG tablet Take 1 tablet (500 mg total) by mouth every 6 (six) hours as needed for muscle spasms. 03/19/20   Kristeen Miss, MD  Multiple Vitamin (MULTIVITAMIN) tablet Take 1 tablet by mouth at bedtime.     [provider]  silodosin (RAPAFLO) 8 MG CAPS capsule Take 1 capsule (8 mg total) by mouth daily with breakfast. 05/15/21   Irine Seal, MD  traMADol (ULTRAM) 50 MG tablet Take 50 mg by mouth 4 (four) times daily. 03/05/20   [provider]    Physical Exam: Vitals:   10/24/21 0932 10/24/21 0937 10/24/21 1000 10/24/21 1030  BP:   98/70 (!) 105/53  Pulse:   98 (!) 110  Resp:   (!) 21 17  Temp:  98.8  F (37.1 C)    TempSrc:  Oral    SpO2:   100% 99%  Weight: 67.7 kg     Height: 5\' 10"  (1.778 m)       Constitutional: frail, elderly male, alert, oriented x 3, NAD, calm, comfortable Eyes: PERRL, lids and conjunctivae normal, MM are dry.  ENMT: Mucous membranes are dry. Posterior pharynx clear of any exudate or lesions.  Neck: normal, supple, no masses, no thyromegaly Respiratory: clear to auscultation bilaterally, no wheezing, no crackles. Normal respiratory effort. No accessory muscle use.  Cardiovascular: normal s1, s2 sounds, no murmurs / rubs / gallops. No extremity edema. 2+ pedal pulses. No carotid bruits.  Abdomen: distended with hyperactive BS.  no tenderness, no masses palpated. No hepatosplenomegaly. Bowel sounds positive.  Musculoskeletal: no  clubbing / cyanosis. No joint deformity upper and lower extremities. Good ROM, no contractures. Normal muscle tone.  Skin: no rashes, lesions, ulcers. No induration Neurologic: CN 2-12 grossly intact. Sensation intact, DTR normal. Strength 5/5 in all 4.  Psychiatric: Normal judgment and insight. Alert and oriented x 3. Normal mood.   Labs on Admission: I have personally reviewed following labs and imaging studies  CBC: Recent Labs  Lab 10/23/21 1550 10/24/21 0945  WBC 12.4* 13.5*  NEUTROABS 10.0*  --   HGB 9.2* 9.2*  HCT 25.9* 27.3*  MCV 97 103.8*  PLT 388 109   Basic Metabolic Panel: Recent Labs  Lab 10/24/21 0945  NA 134*  K 4.7  CL 103  CO2 20*  GLUCOSE 183*  BUN 50*  CREATININE 1.84*  CALCIUM 8.5*   GFR: Estimated Creatinine Clearance: 28.6 mL/min (A) (by C-G formula based on SCr of 1.84 mg/dL (H)). Liver Function Tests: Recent Labs  Lab 10/23/21 1550 10/24/21 0945  AST 512* 474*  ALT 381* 537*  ALKPHOS 1,667* 1,524*  BILITOT 9.0* 10.0*  PROT 6.1 6.7  ALBUMIN 3.0* 2.8*   Recent Labs  Lab 10/23/21 1550  LIPASE 78  AMYLASE 78   No results for input(s): AMMONIA in the last 168 hours. Coagulation  Profile: Recent Labs  Lab 10/24/21 0945  INR 1.1   Cardiac Enzymes: No results for input(s): CKTOTAL, CKMB, CKMBINDEX, TROPONINI in the last 168 hours. BNP (last 3 results) No results for input(s): PROBNP in the last 8760 hours. HbA1C: No results for input(s): HGBA1C in the last 72 hours. CBG: No results for input(s): GLUCAP in the last 168 hours. Lipid Profile: No results for input(s): CHOL, HDL, LDLCALC, TRIG, CHOLHDL, LDLDIRECT in the last 72 hours. Thyroid Function Tests: No results for input(s): TSH, T4TOTAL, FREET4, T3FREE, THYROIDAB in the last 72 hours. Anemia Panel: Recent Labs    10/24/21 1155  RETICCTPCT 1.9   Urine analysis:    Component Value Date/Time   COLORURINE AMBER (A) 10/24/2021 0937   APPEARANCEUR HAZY (A) 10/24/2021 0937   APPEARANCEUR Clear 05/15/2021 1427   LABSPEC 1.032 (H) 10/24/2021 0937   PHURINE 5.0 10/24/2021 0937   GLUCOSEU NEGATIVE 10/24/2021 0937   HGBUR NEGATIVE 10/24/2021 0937   BILIRUBINUR SMALL (A) 10/24/2021 0937   BILIRUBINUR Negative 05/15/2021 1427   KETONESUR NEGATIVE 10/24/2021 0937   PROTEINUR 100 (A) 10/24/2021 0937   UROBILINOGEN 0.2 05/31/2020 1212   NITRITE NEGATIVE 10/24/2021 0937   LEUKOCYTESUR NEGATIVE 10/24/2021 0937    Radiological Exams on Admission: CT Abdomen Pelvis W Contrast  Result Date: 10/23/2021 CLINICAL DATA:  Abdominal distension, jaundice EXAM: CT ABDOMEN AND PELVIS WITH CONTRAST TECHNIQUE: Multidetector CT imaging of the abdomen and pelvis was performed using the standard protocol following bolus administration of intravenous contrast. CONTRAST:  27mL OMNIPAQUE IOHEXOL 300 MG/ML  SOLN COMPARISON:  [facility] CT abdomen/pelvis dated 04/16/2020 FINDINGS: Lower chest: Mild subpleural reticulation at the lung bases. Hepatobiliary: Liver is within normal limits. Gallbladder sludge (series 2/image 29), without associated inflammatory changes. Mild intrahepatic and moderate extrahepatic ductal dilatation,  new from the prior. Common duct measures 2.4 cm centrally (coronal image 42) and sharply tapers at the ampulla (coronal image 43), without convincing obstructing mass on CT. Pancreas: Within normal limits. Note is made of peripancreatic inflammatory changes on the prior, which are improved. Spleen: Within normal limits. Adrenals/Urinary Tract: Adrenal glands are within normal limits. Kidneys are within normal limits.  No hydronephrosis. Bladder is within normal limits. Stomach/Bowel: Stomach is  within normal limits. Prior inflammatory changes along the duodenal bulb (at the site of possible prior duodenal ulcer) have resolved. No evidence of bowel obstruction. Appendix is not discretely visualized. Mild sigmoid diverticulosis, without evidence of diverticulitis. Vascular/Lymphatic: No evidence of abdominal aortic aneurysm. Atherosclerotic calcifications of the abdominal aorta and branch vessels. No suspicious abdominopelvic lymphadenopathy. Reproductive: Prostate is mildly enlarged and indents the base of the bladder, suggesting BPH. Other: No abdominopelvic ascites. Musculoskeletal: Degenerative changes of the visualized thoracolumbar spine. Moderate compression fracture deformity at L5, chronic. IMPRESSION: Mild intrahepatic and moderate extrahepatic occluded 64 new from the prior. Common duct measures 2.4 cm and abruptly tapers at the ampulla, without convincing obstructing mass on CT. This appearance favors a distal CBD stricture, possibly on the basis of prior pancreatitis/duodenitis given the prior CT. ERCP is suggested. Gallbladder sludge, without associated inflammatory changes. Electronically Signed   By: Julian Hy M.D.   On: 10/23/2021 19:58    EKG: Independently reviewed. Sinus tachycardia   Assessment/Plan Principal Problem:   Hyperbilirubinemia Active Problems:   Biliary obstruction   Guaiac positive stools   Hyperlipidemia   PREMATURE ATRIAL CONTRACTIONS   Lumbar spinal stenosis    HTN (hypertension)   GERD (gastroesophageal reflux disease)   AKI (acute kidney injury) (HCC)   Leukocytosis   Elevated liver enzymes   Anemia, unspecified   Hyperglycemia   Hyponatremia   Hypotension   Abnormal weight loss   Jaundice   Working diagnosis of CBD obstruction  -Patient presents with worsening hyperbilirubinemia and LFTs -CT suggestive of biliary obstruction involving CBD -Keep n.p.o. for now. -Ad[facility] to [facility] for [facility]CP -[facility] has been consulted and planning to see patient at [facility] -IV fluids ordered. -Blood cultures x2 -IV Zosyn ordered  Hyperbilirubinemia -Secondary to biliary obstruction -Planning ERCP at [facility]  Leukocytosis -Follow CBC with differential -Blood cultures x2 ordered -Empiric IV Zosyn ordered  Abnormal weight loss -Secondary to biliary obstruction -N.p.o. for now pending ERCP -Consult dietitian when able to take oral diet again  Hyponatremia -Secondary to dehydration -Treating with IV normal saline solution -Follow CMP  Elevated MCV anemia -Guaiac positive stools noted -Check anemia panel -Follow CBC daily -Continue Protonix for GI protection  Jaundice -Secondary to hyperbilirubinemia -Patient is completely asymptomatic at this time  Acute kidney injury -Secondary to poor oral intake -Treating with IV fluids -Renally dose medications as appropriate -Follow renal function -Hold home ARB medication at this time  GERD -Protonix ordered for GI protection  Hypotension -Suspect related to dehydration and poor oral intake -Treating with IV fluids -Hold home oral blood pressure medication at this time  Hyperlipidemia -Hold atorvastatin at this time given acute liver injury   DVT prophylaxis: SCD Code Status: Full Family Communication: Son updated at bedside Disposition Plan: Admit to [facility] called: [facility]  Admission status: INP  Level of care: Telemetry Medical Irwin Brakeman  MD [facility] How to contact the [facility] Attending or Consulting provider 7A - 7P or covering provider during after hours 7P -7A, for this patient?  Check the care team in [facility] and look for a) attending/consulting [facility] provider listed and b) the [facility] team listed Log into [email or web address] and use [place]'s universal password to access. If you do not have the password, please contact the hospital operator. Locate the [facility] provider you are looking for under [facility] and page to a number that you can be directly reached. If you still have difficulty reaching the provider, please page the  DOC (Director on Call) for the Hospitalists listed on amion for assistance.   If 7PM-7AM, please contact night-coverage [email or web address] Password [facility]  10/24/2021, 12:21 PM

## 2021-10-24 NOTE — ED Notes (Signed)
Pt up to bedside commode.  Pt very weak.

## 2021-10-24 NOTE — Progress Notes (Signed)
Pharmacy Antibiotic Note  Terry Hernandez is a 87 y.o. male admitted on 10/24/2021 with  intra-abdominal infection .  Pharmacy has been consulted for zosyn dosing.  Plan: Zosyn 3.375g IV q8h (4 hour infusion). F/u cxs and clinical progress Monitor V/S, labs  Height: 5\' 10"  (177.8 cm) Weight: 67.7 kg (149 lb 4 oz) IBW/kg (Calculated) : 73  Temp (24hrs), Avg:98.7 F (37.1 C), Min:98.5 F (36.9 C), Max:98.8 F (37.1 C)  Recent Labs  Lab 10/23/21 1550 10/24/21 0945  WBC 12.4* 13.5*  CREATININE  --  1.84*    Estimated Creatinine Clearance: 28.6 mL/min (A) (by C-G formula based on SCr of 1.84 mg/dL (H)).    Allergies  Allergen Reactions   Cephalexin Rash    REACTION: rash    Antimicrobials this admission: zosyn 11/11 >>   Microbiology results: No cxs  Thank you for allowing pharmacy to be a part of this patient's care.  Isac Sarna, BS [place], [place] Clinical Pharmacist Pager [phone] 10/24/2021 12:08 PM

## 2021-10-24 NOTE — ED Notes (Signed)
Pt had small BM.

## 2021-10-25 DIAGNOSIS — K921 Melena: Secondary | ICD-10-CM | POA: Diagnosis not present

## 2021-10-25 DIAGNOSIS — K831 Obstruction of bile duct: Secondary | ICD-10-CM | POA: Diagnosis not present

## 2021-10-25 DIAGNOSIS — R1319 Other dysphagia: Secondary | ICD-10-CM | POA: Diagnosis not present

## 2021-10-25 LAB — CBC
HCT: 24.4 % — ABNORMAL LOW (ref 39.0–52.0)
Hemoglobin: 8.1 g/dL — ABNORMAL LOW (ref 13.0–17.0)
MCH: 33.9 pg (ref 26.0–34.0)
MCHC: 33.2 g/dL (ref 30.0–36.0)
MCV: 102.1 fL — ABNORMAL HIGH (ref 80.0–100.0)
Platelets: 319 10*3/uL (ref 150–400)
RBC: 2.39 MIL/uL — ABNORMAL LOW (ref 4.22–5.81)
RDW: 17.4 % — ABNORMAL HIGH (ref 11.5–15.5)
WBC: 9.3 10*3/uL (ref 4.0–10.5)
nRBC: 0 % (ref 0.0–0.2)

## 2021-10-25 LAB — LIPID PANEL
Cholesterol: 285 mg/dL — ABNORMAL HIGH (ref 0–200)
HDL: <10 mg/dL — ABNORMAL LOW (ref >40)
Triglycerides: 124 mg/dL (ref <150)
VLDL: 25 mg/dL (ref 0–40)

## 2021-10-25 LAB — PROTIME-INR
INR: 1.3 — ABNORMAL HIGH (ref 0.8–1.2)
Prothrombin Time: 16.1 seconds — ABNORMAL HIGH (ref 11.4–15.2)

## 2021-10-25 LAB — CBC WITH DIFFERENTIAL/PLATELET
Abs Immature Granulocytes: 0.03 10*3/uL (ref 0.00–0.07)
Basophils Absolute: 0 10*3/uL (ref 0.0–0.1)
Basophils Relative: 0 %
Eosinophils Absolute: 0.2 10*3/uL (ref 0.0–0.5)
Eosinophils Relative: 2 %
HCT: 23.6 % — ABNORMAL LOW (ref 39.0–52.0)
Hemoglobin: 7.6 g/dL — ABNORMAL LOW (ref 13.0–17.0)
Immature Granulocytes: 0 %
Lymphocytes Relative: 14 %
Lymphs Abs: 1.4 10*3/uL (ref 0.7–4.0)
MCH: 33.2 pg (ref 26.0–34.0)
MCHC: 32.2 g/dL (ref 30.0–36.0)
MCV: 103.1 fL — ABNORMAL HIGH (ref 80.0–100.0)
Monocytes Absolute: 1.1 10*3/uL — ABNORMAL HIGH (ref 0.1–1.0)
Monocytes Relative: 11 %
Neutro Abs: 7.3 10*3/uL (ref 1.7–7.7)
Neutrophils Relative %: 73 %
Platelets: 324 10*3/uL (ref 150–400)
RBC: 2.29 MIL/uL — ABNORMAL LOW (ref 4.22–5.81)
RDW: 17.7 % — ABNORMAL HIGH (ref 11.5–15.5)
WBC: 10 10*3/uL (ref 4.0–10.5)
nRBC: 0 % (ref 0.0–0.2)

## 2021-10-25 LAB — COMPREHENSIVE METABOLIC PANEL
ALT: 260 U/L — ABNORMAL HIGH (ref 0–44)
ALT: 259 U/L — ABNORMAL HIGH (ref 0–44)
AST: 375 U/L — ABNORMAL HIGH (ref 15–41)
AST: 371 U/L — ABNORMAL HIGH (ref 15–41)
Albumin: 2.1 g/dL — ABNORMAL LOW (ref 3.5–5.0)
Albumin: 2.1 g/dL — ABNORMAL LOW (ref 3.5–5.0)
Alkaline Phosphatase: 1160 U/L — ABNORMAL HIGH (ref 38–126)
Alkaline Phosphatase: 1195 U/L — ABNORMAL HIGH (ref 38–126)
Anion gap: 9 (ref 5–15)
Anion gap: 11 (ref 5–15)
BUN: 47 mg/dL — ABNORMAL HIGH (ref 8–23)
BUN: 48 mg/dL — ABNORMAL HIGH (ref 8–23)
CO2: 19 mmol/L — ABNORMAL LOW (ref 22–32)
CO2: 20 mmol/L — ABNORMAL LOW (ref 22–32)
Calcium: 8 mg/dL — ABNORMAL LOW (ref 8.9–10.3)
Calcium: 8.1 mg/dL — ABNORMAL LOW (ref 8.9–10.3)
Chloride: 105 mmol/L (ref 98–111)
Chloride: 105 mmol/L (ref 98–111)
Creatinine, Ser: 2.09 mg/dL — ABNORMAL HIGH (ref 0.61–1.24)
Creatinine, Ser: 2.22 mg/dL — ABNORMAL HIGH (ref 0.61–1.24)
GFR, Estimated: 31 mL/min — ABNORMAL LOW (ref >60)
GFR, Estimated: 29 mL/min — ABNORMAL LOW (ref >60)
Glucose, Bld: 137 mg/dL — ABNORMAL HIGH (ref 70–99)
Glucose, Bld: 139 mg/dL — ABNORMAL HIGH (ref 70–99)
Potassium: 4.7 mmol/L (ref 3.5–5.1)
Potassium: 4.7 mmol/L (ref 3.5–5.1)
Sodium: 133 mmol/L — ABNORMAL LOW (ref 135–145)
Sodium: 136 mmol/L (ref 135–145)
Total Bilirubin: 8 mg/dL — ABNORMAL HIGH (ref 0.3–1.2)
Total Bilirubin: 8.7 mg/dL — ABNORMAL HIGH (ref 0.3–1.2)
Total Protein: 5.4 g/dL — ABNORMAL LOW (ref 6.5–8.1)
Total Protein: 5.5 g/dL — ABNORMAL LOW (ref 6.5–8.1)

## 2021-10-25 MED ORDER — PANTOPRAZOLE SODIUM 40 MG IV SOLR
40.0000 mg | Freq: Two times a day (BID) | INTRAVENOUS | Status: DC
  Administered 2021-10-25 – 2021-10-29 (×9): 40 mg via INTRAVENOUS
  Filled 2021-10-25 (×9): qty 40

## 2021-10-25 MED ORDER — TAMSULOSIN HCL 0.4 MG PO CAPS
0.4000 mg | ORAL_CAPSULE | Freq: Every day | ORAL | Status: DC
  Administered 2021-10-25 – 2021-10-28 (×4): 0.4 mg via ORAL
  Filled 2021-10-25 (×4): qty 1

## 2021-10-25 NOTE — Consult Note (Signed)
Consultation  Referring Provider:     [facility] Primary Care Physician:  [name], FNP Primary Gastroenterologist:      none - new, unassigned   Reason for Consultation:     obstructive jaundice     Impression / Plan:   Obstructive jaundice Prior pancreatitis abnormal pancreas on CT Hx oof melena and heme + w/ anemia- acute blood loss suspected Intermittent solid food dysphagia   Difficult to come up with a unifying diagnosis to explain all of these problems.  Despite the lack of an overt mass I am concerned about the possibility of a pancreatic or ampullary malignancy in this patient.  He certainly could have esophageal dysmotility or ring or stricture or hiatal hernia issues causing the dysphagia.  An ulcerated ampullary mass could cause bleeding.    Recheck CBC and type and screen now - may need blood EGD, ERCP tomorrow to evaluate and treat dysphagia, melena and obstructive jaundice Risks, benefits and indications reviewed w/ patient and son. Change PPI to bid - allow soft diet and NPO after MN Follow-up on chemistries today he has deteriorating renal function.  [name], MD, [facility] 10/25/2021 10:34 AM          HPI:   Terry Hernandez is a 87 y.o. male who is hospitalized for only the second time in his life after he developed jaundice and weakness.  Appetite has been off lately.  He was seen in the outpatient setting where he had jaundice noted, a CT scan was performed as below demonstrating obstructive jaundice.  There is a background of some intermittent solid food dysphagia.  There is mild weight loss of a few pounds lately.  He did have pale stools the other week and then he has been having some dark black stools and has been heme positive with a decline in hemoglobin noted since admission.  Hemoglobin was in the nines on admit and is now 7.  Stool looks less bloody today i.e. less dark per son who gives part of the history.  The patient  had back surgery last year and had a CT at [facility] which raised a question of "groove pancreatitis" or some other process causing changes as you can see below in the impression from the CT scan from 2021.  Now he has markedly dilated bile ducts and jaundice.  He has been somewhat weak.  No nausea or vomiting.  I do not think he has been taking NSAIDs recently but I did not ask specifically.  He does use Tylenol regularly 1 to 2 g a day.  He has never had reason to see a gastroenterologist before.  He is not having pruritus or fever or chills.  He has been placed on prophylactic Zosyn.  I have personally reviewed the images below.  CT Abd Pelvis 10/23/21 IMPRESSION: Mild intrahepatic and moderate extrahepatic occluded 64 new from the prior. Common duct measures 2.4 cm and abruptly tapers at the ampulla, without convincing obstructing mass on CT.   This appearance favors a distal CBD stricture, possibly on the basis of prior pancreatitis/duodenitis given the prior CT. ERCP is suggested.   Gallbladder sludge, without associated inflammatory changes.  CT abd/pelvis 04/16/20 [facility] IMPRESSION: 1. There are mild inflammatory changes about the pancreatic head and within the pancreatic groove. There are inflammatory changes about the proximal duodenum. These findings may be secondary to acute pancreatitis or duodenitis. Correlation with laboratory studies is recommended. 2. There is  a very large amount of stool throughout the colon and rectum. 3. There is mild bilateral hydronephrosis to the level of the urinary bladder. This is likely secondary to the moderately to severely distended bladder. 4. Again noted is a compression fracture of the L5 vertebral body. The patient's prior MRI could not be retrieved for comparison. There is an age-indeterminate mild compression fracture through the superior endplate of the L4 vertebral body. This was not previously described.  Aortic  Atherosclerosis (ICD10-I70.0).   Electronically Signed By: Constance Holster M.D. On: 04/16/2020 21:59   Past Medical History:  Diagnosis Date   Arthritis    GERD (gastroesophageal reflux disease)    PMH   Glucose intolerance (impaired glucose tolerance)    High cholesterol    History of kidney stones    HTN (hypertension)    Hyperlipidemia    Lumbar stenosis with neurogenic claudication    Pneumonia    Subcutaneous mass    Wears glasses     Past Surgical History:  Procedure Laterality Date   APPENDECTOMY     COLONOSCOPY W/ BIOPSIES AND POLYPECTOMY     LUMBAR LAMINECTOMY/DECOMPRESSION MICRODISCECTOMY Bilateral 03/18/2020   Procedure: Bilateral Lumbar three-four Lumbar four-five Laminotomy/foraminotomy;  Surgeon: Kristeen Miss, MD;  Location: [place];  Service: Neurosurgery;  Laterality: Bilateral;   MASS EXCISION Right 03/18/2020   Procedure: Excision of a Right gluteal subcutaneous mass;  Surgeon: Kristeen Miss, MD;  Location: [place];  Service: Neurosurgery;  Laterality: Right;   MULTIPLE TOOTH EXTRACTIONS      Family History  Problem Relation Age of Onset   Cancer Mother    Stroke Father    Cancer Son        lung    Social History   Tobacco Use   Smoking status: Never   Smokeless tobacco: Never  Vaping Use   Vaping Use: Never used  Substance Use Topics   Alcohol use: Never   Drug use: Never    Prior to Admission medications   Medication Sig Start Date End Date Taking? Authorizing Provider  acetaminophen (TYLENOL) 500 MG tablet Take 1,000 mg by mouth 4 (four) times daily.   Yes [provider]  Ascorbic Acid (VITAMIN C PO) Take 1 tablet by mouth daily.   Yes [provider]  aspirin EC 81 MG tablet Take 81 mg by mouth at bedtime.    Yes [provider]  atorvastatin (LIPITOR) 20 MG tablet Take 20 mg by mouth at bedtime.    Yes [provider]  B Complex Vitamins (VITAMIN B COMPLEX PO) Take 1 tablet by mouth daily.   Yes  [provider]  losartan (COZAAR) 100 MG tablet Take 100 mg by mouth at bedtime.  02/05/20  Yes [provider]  methocarbamol (ROBAXIN) 500 MG tablet Take 1 tablet (500 mg total) by mouth every 6 (six) hours as needed for muscle spasms. 03/19/20  Yes Kristeen Miss, MD  Multiple Vitamin (MULTIVITAMIN) tablet Take 1 tablet by mouth at bedtime.    Yes [provider]  silodosin (RAPAFLO) 8 MG CAPS capsule Take 1 capsule (8 mg total) by mouth daily with breakfast. 05/15/21  Yes Irine Seal, MD  traMADol (ULTRAM) 50 MG tablet Take 50 mg by mouth 4 (four) times daily. 03/05/20  Yes [provider]    Current Facility-Administered Medications  Medication Dose Route Frequency Provider Last Rate Last Admin   0.9 %  sodium chloride infusion   Intravenous Continuous Johnson, Clanford L, MD 100 mL/hr  at 10/24/21 2006 New Bag at 10/24/21 2006   fentaNYL (SUBLIMAZE) injection 12.5-50 mcg  12.5-50 mcg Intravenous Q2H PRN Johnson, Clanford L, MD       ondansetron (ZOFRAN) tablet 4 mg  4 mg Oral Q6H PRN Johnson, Clanford L, MD       Or   ondansetron (ZOFRAN) injection 4 mg  4 mg Intravenous Q6H PRN Johnson, Clanford L, MD       pantoprazole (PROTONIX) injection 40 mg  40 mg Intravenous Q24H Johnson, Clanford L, MD       piperacillin-tazobactam (ZOSYN) IVPB 3.375 g  3.375 g Intravenous Q8H Johnson, Clanford L, MD 12.5 mL/hr at 10/25/21 0415 3.375 g at 10/25/21 0415    Allergies as of 10/24/2021 - Review Complete 10/24/2021  Allergen Reaction Noted   Cephalexin Rash      Review of Systems:    This is positive for those things mentioned in the HPI. All other review of systems are negative.       Physical Exam:  Vital signs in last 24 hours: Temp:  [97.7 F (36.5 C)-98.6 F (37 C)] 98.6 F (37 C) (11/12 0851) Pulse Rate:  [81-123] 81 (11/12 0851) Resp:  [15-28] 16 (11/12 0851) BP: (86-115)/(46-73) 104/57 (11/12 0851) SpO2:  [94 %-100 %] 99 % (11/12 0851) Weight:   [68.5 kg] 68.5 kg (11/12 0500) Last BM Date:  (pta)  General:  Elderly and jaundiced wm NAD Eyes:  icteric. Lungs: Clear to auscultation bilaterally. Heart:  S1S2, no rubs, murmurs, gallops. Abdomen:  soft, non-tender, no hepatosplenomegaly, hernia, or mass and BS+.  Rectal: Not done - heme + documented Extremities:   no edema Skin   Jaundiced  Neuro:  A&O x 3.  Psych:  appropriate mood and  Affect.   Data Reviewed:   LAB RESULTS: Recent Labs    10/23/21 1550 10/24/21 0945 10/25/21 0452  WBC 12.4* 13.5* 10.0  HGB 9.2* 9.2* 7.6*  HCT 25.9* 27.3* 23.6*  PLT 388 370 324   BMET Recent Labs    10/24/21 0945  NA 134*  K 4.7  CL 103  CO2 20*  GLUCOSE 183*  BUN 50*  CREATININE 1.84*  CALCIUM 8.5*   LFT Recent Labs    10/23/21 1550 10/24/21 0945  PROT 6.1 6.7  ALBUMIN 3.0* 2.8*  AST 512* 474*  ALT 381* 537*  ALKPHOS 1,667* 1,524*  BILITOT 9.0* 10.0*  BILIDIR 7.51*  --    PT/INR Recent Labs    10/24/21 0945  LABPROT 14.1  INR 1.1    STUDIES: CT Abdomen Pelvis W Contrast  Result Date: 10/23/2021 CLINICAL DATA:  Abdominal distension, jaundice EXAM: CT ABDOMEN AND PELVIS WITH CONTRAST TECHNIQUE: Multidetector CT imaging of the abdomen and pelvis was performed using the standard protocol following bolus administration of intravenous contrast. CONTRAST:  43mL OMNIPAQUE IOHEXOL 300 MG/ML  SOLN COMPARISON:  [facility] CT abdomen/pelvis dated 04/16/2020 FINDINGS: Lower chest: Mild subpleural reticulation at the lung bases. Hepatobiliary: Liver is within normal limits. Gallbladder sludge (series 2/image 29), without associated inflammatory changes. Mild intrahepatic and moderate extrahepatic ductal dilatation, new from the prior. Common duct measures 2.4 cm centrally (coronal image 42) and sharply tapers at the ampulla (coronal image 43), without convincing obstructing mass on CT. Pancreas: Within normal limits. Note is made of peripancreatic inflammatory changes  on the prior, which are improved. Spleen: Within normal limits. Adrenals/Urinary Tract: Adrenal glands are within normal limits. Kidneys are within normal limits.  No hydronephrosis. Bladder is within normal  limits. Stomach/Bowel: Stomach is within normal limits. Prior inflammatory changes along the duodenal bulb (at the site of possible prior duodenal ulcer) have resolved. No evidence of bowel obstruction. Appendix is not discretely visualized. Mild sigmoid diverticulosis, without evidence of diverticulitis. Vascular/Lymphatic: No evidence of abdominal aortic aneurysm. Atherosclerotic calcifications of the abdominal aorta and branch vessels. No suspicious abdominopelvic lymphadenopathy. Reproductive: Prostate is mildly enlarged and indents the base of the bladder, suggesting BPH. Other: No abdominopelvic ascites. Musculoskeletal: Degenerative changes of the visualized thoracolumbar spine. Moderate compression fracture deformity at L5, chronic. IMPRESSION: Mild intrahepatic and moderate extrahepatic occluded 64 new from the prior. Common duct measures 2.4 cm and abruptly tapers at the ampulla, without convincing obstructing mass on CT. This appearance favors a distal CBD stricture, possibly on the basis of prior pancreatitis/duodenitis given the prior CT. ERCP is suggested. Gallbladder sludge, without associated inflammatory changes. Electronically Signed   By: Julian Hy M.D.   On: 10/23/2021 19:58       Thanks   LOS: 1 day   @Rosaura Bolon  Simonne Maffucci, MD, [facility] @  10/25/2021, 9:41 AM

## 2021-10-25 NOTE — Progress Notes (Signed)
PROGRESS NOTE   Terry Hernandez  MVE:[id number] DOB: 06-Jul-1934 DOA: 10/24/2021 PCP: [name], FNP  Brief Narrative:  87 year old community dwelling white male, disease?  A. fib followed by [facility] cardiology [placed on Zio patch in January of this year], HTN, HLD, CKD 3 with chronic back and left foot drop-spinal stenosis laminectomy 03/2020 Dr. Ellene Route  went to visit NP at [facility] family practice 10/23/2021--noted jaundice over the past week-weight loss 5 pounds in the last 10 days-intermittent tarry dark stool-takes Tylenol 1 to 2 g daily-sent for stat CT abdomen pelvis and viral hepatitis panel etc. AST ALT 512/381 WBC 12.4 hepatitis panel negative CT scan showed CBD stricture Transferred from [facility] to [facility] for GI eval and possible ERCP  Hospital-Problem based course  GI bleed? Last stool yesterday was dark but formed and solid Monitor consistency-May require work-up this admission Dysphagia For upper endoscopy 11/13, continue Protonix 40 IV twice daily May continue dysphagia 3 diet n.p.o. after midnight for work-up Continue saline 100 cc/H ?  Anemia of blood loss from GI source versus hemolysis Likely blood loss anemia-hemoglobin 7.6 in the setting of dark stool If drops below 7 will transfuse 1 unit PRBC diet as above for now AKI Probable combination of AIN from blood loss anemia in addition to prior to admission use losartan Rule out obstructive pathology--get bladder scan Paroxysmal atrial fibrillation Keep on monitors-patient is not a good candidate at this time for anticoagulation Transaminitis Possibility raised of biliary stricture versus hepatobiliary obstruction- Would continue Zosyn at this time with fluids until this is clarified  DVT prophylaxis: scd Code Status: full Family Communication: son [name] [phone] at bedside updated fully and understands Disposition:  Status is: Inpatient  Remains inpatient appropriate because: not ready  to d/c       Consultants:  Gastroenterology  Procedures: None yet  Antimicrobials: IV Zosyn   Subjective:  Feels fair-no dark stools-no fever no chills no nausea no vomiting Abdomen soft he is tolerating a diet  Objective: Vitals:   10/24/21 1940 10/25/21 0014 10/25/21 0437 10/25/21 0500  BP: 107/66 110/66 (!) 103/49   Pulse: 94 89 87   Resp: 16 16 16    Temp: 97.7 F (36.5 C) 98.3 F (36.8 C) 98.4 F (36.9 C)   TempSrc: Oral Oral Oral   SpO2: 94% 100% 100%   Weight:    68.5 kg  Height:        Intake/Output Summary (Last 24 hours) at 10/25/2021 0744 Last data filed at 10/24/2021 1940 Gross per 24 hour  Intake 550 ml  Output 300 ml  Net 250 ml   Filed Weights   10/24/21 0932 10/25/21 0500  Weight: 67.7 kg 68.5 kg    Examination:  Mild icterus no pallor Chest clear no rales no rhonchi S1-S2 no murmur seems to be in A. fib on monitors Abdomen soft no epigastric tenderness no right upper quadrant tenderness no rebound no guarding Psych euthymic coherent no distress No Lower extremity edema  Data Reviewed: personally reviewed   CBC    Component Value Date/Time   WBC 10.0 10/25/2021 0452   RBC 2.29 (L) 10/25/2021 0452   HGB 7.6 (L) 10/25/2021 0452   HGB 9.2 (L) 10/23/2021 1550   HCT 23.6 (L) 10/25/2021 0452   HCT 25.9 (L) 10/23/2021 1550   PLT 324 10/25/2021 0452   PLT 388 10/23/2021 1550   MCV 103.1 (H) 10/25/2021 0452   MCV 97 10/23/2021 1550   MCH 33.2 10/25/2021 0452  MCHC 32.2 10/25/2021 0452   RDW 17.7 (H) 10/25/2021 0452   RDW 13.6 10/23/2021 1550   LYMPHSABS 1.4 10/25/2021 0452   LYMPHSABS 1.1 10/23/2021 1550   MONOABS 1.1 (H) 10/25/2021 0452   EOSABS 0.2 10/25/2021 0452   EOSABS 0.1 10/23/2021 1550   BASOSABS 0.0 10/25/2021 0452   BASOSABS 0.0 10/23/2021 1550   CMP Latest Ref Rng & Units 10/24/2021 10/23/2021 03/18/2020  Glucose 70 - 99 mg/dL 183(H) - 142(H)  BUN 8 - 23 mg/dL 50(H) - 23  Creatinine 0.61 - 1.24 mg/dL 1.84(H) - 1.05   Sodium 135 - 145 mmol/L 134(L) - 139  Potassium 3.5 - 5.1 mmol/L 4.7 - 3.9  Chloride 98 - 111 mmol/L 103 - 104  CO2 22 - 32 mmol/L 20(L) - 26  Calcium 8.9 - 10.3 mg/dL 8.5(L) - 9.3  Total Protein 6.5 - 8.1 g/dL 6.7 6.1 -  Total Bilirubin 0.3 - 1.2 mg/dL 10.0(H) 9.0(H) -  Alkaline Phos 38 - 126 U/L 1,524(H) 1,667(HH) -  AST 15 - 41 U/L 474(H) 512(HH) -  ALT 0 - 44 U/L 537(H) 381(H) -     Radiology Studies: CT Abdomen Pelvis W Contrast  Result Date: 10/23/2021 CLINICAL DATA:  Abdominal distension, jaundice EXAM: CT ABDOMEN AND PELVIS WITH CONTRAST TECHNIQUE: Multidetector CT imaging of the abdomen and pelvis was performed using the standard protocol following bolus administration of intravenous contrast. CONTRAST:  31mL OMNIPAQUE IOHEXOL 300 MG/ML  SOLN COMPARISON:  [facility] CT abdomen/pelvis dated 04/16/2020 FINDINGS: Lower chest: Mild subpleural reticulation at the lung bases. Hepatobiliary: Liver is within normal limits. Gallbladder sludge (series 2/image 29), without associated inflammatory changes. Mild intrahepatic and moderate extrahepatic ductal dilatation, new from the prior. Common duct measures 2.4 cm centrally (coronal image 42) and sharply tapers at the ampulla (coronal image 43), without convincing obstructing mass on CT. Pancreas: Within normal limits. Note is made of peripancreatic inflammatory changes on the prior, which are improved. Spleen: Within normal limits. Adrenals/Urinary Tract: Adrenal glands are within normal limits. Kidneys are within normal limits.  No hydronephrosis. Bladder is within normal limits. Stomach/Bowel: Stomach is within normal limits. Prior inflammatory changes along the duodenal bulb (at the site of possible prior duodenal ulcer) have resolved. No evidence of bowel obstruction. Appendix is not discretely visualized. Mild sigmoid diverticulosis, without evidence of diverticulitis. Vascular/Lymphatic: No evidence of abdominal aortic aneurysm.  Atherosclerotic calcifications of the abdominal aorta and branch vessels. No suspicious abdominopelvic lymphadenopathy. Reproductive: Prostate is mildly enlarged and indents the base of the bladder, suggesting BPH. Other: No abdominopelvic ascites. Musculoskeletal: Degenerative changes of the visualized thoracolumbar spine. Moderate compression fracture deformity at L5, chronic. IMPRESSION: Mild intrahepatic and moderate extrahepatic occluded 64 new from the prior. Common duct measures 2.4 cm and abruptly tapers at the ampulla, without convincing obstructing mass on CT. This appearance favors a distal CBD stricture, possibly on the basis of prior pancreatitis/duodenitis given the prior CT. ERCP is suggested. Gallbladder sludge, without associated inflammatory changes. Electronically Signed   By: Julian Hy M.D.   On: 10/23/2021 19:58     Scheduled Meds:  pantoprazole (PROTONIX) IV  40 mg Intravenous Q12H   Continuous Infusions:  sodium chloride 100 mL/hr at 10/24/21 2006   piperacillin-tazobactam (ZOSYN)  IV 3.375 g (10/25/21 0415)  Patient in for   LOS: 1 day   Time spent: [place], MD [facility] To contact the attending provider between 7A-7P or the covering provider during after hours 7P-7A, please log into the  web site [email or web address] and access using universal [place] password for that web site. If you do not have the password, please call the hospital operator.  10/25/2021, 7:44 AM

## 2021-10-26 ENCOUNTER — Inpatient Hospital Stay (HOSPITAL_COMMUNITY): Payer: Medicare Other | Admitting: Certified Registered"

## 2021-10-26 ENCOUNTER — Inpatient Hospital Stay (HOSPITAL_COMMUNITY): Payer: Medicare Other

## 2021-10-26 ENCOUNTER — Encounter (HOSPITAL_COMMUNITY): Payer: Self-pay | Admitting: Family Medicine

## 2021-10-26 ENCOUNTER — Encounter (HOSPITAL_COMMUNITY)
Admission: EM | Disposition: A | Discharge status: Home / Self Care | Payer: Self-pay | Source: Home / Self Care | Attending: Family Medicine

## 2021-10-26 HISTORY — PX: BILIARY BRUSHING: SHX6843

## 2021-10-26 HISTORY — PX: BILIARY STENT PLACEMENT: SHX5538

## 2021-10-26 HISTORY — PX: ESOPHAGOGASTRODUODENOSCOPY: SHX5428

## 2021-10-26 HISTORY — PX: SPHINCTEROTOMY: SHX5279

## 2021-10-26 HISTORY — PX: ERCP: SHX5425

## 2021-10-26 LAB — CBC WITH DIFFERENTIAL/PLATELET
Abs Immature Granulocytes: 0.04 10*3/uL (ref 0.00–0.07)
Basophils Absolute: 0 10*3/uL (ref 0.0–0.1)
Basophils Relative: 0 %
Eosinophils Absolute: 0.2 10*3/uL (ref 0.0–0.5)
Eosinophils Relative: 2 %
HCT: 22 % — ABNORMAL LOW (ref 39.0–52.0)
Hemoglobin: 7.2 g/dL — ABNORMAL LOW (ref 13.0–17.0)
Immature Granulocytes: 1 %
Lymphocytes Relative: 13 %
Lymphs Abs: 1.2 10*3/uL (ref 0.7–4.0)
MCH: 33.6 pg (ref 26.0–34.0)
MCHC: 32.7 g/dL (ref 30.0–36.0)
MCV: 102.8 fL — ABNORMAL HIGH (ref 80.0–100.0)
Monocytes Absolute: 1.1 10*3/uL — ABNORMAL HIGH (ref 0.1–1.0)
Monocytes Relative: 12 %
Neutro Abs: 6.2 10*3/uL (ref 1.7–7.7)
Neutrophils Relative %: 72 %
Platelets: 296 10*3/uL (ref 150–400)
RBC: 2.14 MIL/uL — ABNORMAL LOW (ref 4.22–5.81)
RDW: 17.1 % — ABNORMAL HIGH (ref 11.5–15.5)
WBC: 8.8 10*3/uL (ref 4.0–10.5)
nRBC: 0 % (ref 0.0–0.2)

## 2021-10-26 LAB — COMPREHENSIVE METABOLIC PANEL
ALT: 218 U/L — ABNORMAL HIGH (ref 0–44)
AST: 301 U/L — ABNORMAL HIGH (ref 15–41)
Albumin: 1.9 g/dL — ABNORMAL LOW (ref 3.5–5.0)
Alkaline Phosphatase: 1051 U/L — ABNORMAL HIGH (ref 38–126)
Anion gap: 9 (ref 5–15)
BUN: 39 mg/dL — ABNORMAL HIGH (ref 8–23)
CO2: 18 mmol/L — ABNORMAL LOW (ref 22–32)
Calcium: 7.5 mg/dL — ABNORMAL LOW (ref 8.9–10.3)
Chloride: 106 mmol/L (ref 98–111)
Creatinine, Ser: 2.22 mg/dL — ABNORMAL HIGH (ref 0.61–1.24)
GFR, Estimated: 29 mL/min — ABNORMAL LOW (ref >60)
Glucose, Bld: 200 mg/dL — ABNORMAL HIGH (ref 70–99)
Potassium: 4.5 mmol/L (ref 3.5–5.1)
Sodium: 133 mmol/L — ABNORMAL LOW (ref 135–145)
Total Bilirubin: 6.9 mg/dL — ABNORMAL HIGH (ref 0.3–1.2)
Total Protein: 5.1 g/dL — ABNORMAL LOW (ref 6.5–8.1)

## 2021-10-26 LAB — PROTIME-INR
INR: 1.2 (ref 0.8–1.2)
Prothrombin Time: 15.5 seconds — ABNORMAL HIGH (ref 11.4–15.2)

## 2021-10-26 LAB — PREPARE RBC (CROSSMATCH)

## 2021-10-26 SURGERY — EGD (ESOPHAGOGASTRODUODENOSCOPY)
Anesthesia: General

## 2021-10-26 MED ORDER — SODIUM CHLORIDE 0.9% IV SOLUTION: Freq: Once | INTRAVENOUS | Status: AC

## 2021-10-26 MED ORDER — SODIUM CHLORIDE 0.9 % IV SOLN
INTRAVENOUS | Status: DC | PRN
  Administered 2021-10-26: 60 mL

## 2021-10-26 MED ORDER — PHENYLEPHRINE HCL-NACL 20-0.9 MG/250ML-% IV SOLN
INTRAVENOUS | Status: DC | PRN
  Administered 2021-10-26: 40 ug/min via INTRAVENOUS

## 2021-10-26 MED ORDER — FUROSEMIDE 10 MG/ML IJ SOLN
20.0000 mg | Freq: Once | INTRAMUSCULAR | Status: AC
  Administered 2021-10-26: 20 mg via INTRAVENOUS
  Filled 2021-10-26: qty 2

## 2021-10-26 MED ORDER — PROPOFOL 10 MG/ML IV BOLUS
INTRAVENOUS | Status: DC | PRN
  Administered 2021-10-26: 100 mg via INTRAVENOUS

## 2021-10-26 MED ORDER — INDOMETHACIN 50 MG RE SUPP
RECTAL | Status: AC
  Filled 2021-10-26: qty 2

## 2021-10-26 MED ORDER — PHENYLEPHRINE 40 MCG/ML (10ML) SYRINGE FOR IV PUSH (FOR BLOOD PRESSURE SUPPORT)
PREFILLED_SYRINGE | INTRAVENOUS | Status: DC | PRN
  Administered 2021-10-26: 40 ug via INTRAVENOUS
  Administered 2021-10-26: 120 ug via INTRAVENOUS
  Administered 2021-10-26: 80 ug via INTRAVENOUS
  Administered 2021-10-26: 120 ug via INTRAVENOUS

## 2021-10-26 MED ORDER — INDOMETHACIN 50 MG RE SUPP
RECTAL | Status: DC | PRN
  Administered 2021-10-26: 100 mg via RECTAL

## 2021-10-26 MED ORDER — GLUCAGON HCL RDNA (DIAGNOSTIC) 1 MG IJ SOLR
INTRAMUSCULAR | Status: AC
  Filled 2021-10-26: qty 1

## 2021-10-26 MED ORDER — LIDOCAINE 2% (20 MG/ML) 5 ML SYRINGE
INTRAMUSCULAR | Status: DC | PRN
  Administered 2021-10-26: 80 mg via INTRAVENOUS

## 2021-10-26 MED ORDER — ONDANSETRON HCL 4 MG/2ML IJ SOLN: 4.0000 mg | Freq: Once | INTRAMUSCULAR | Status: DC | PRN

## 2021-10-26 MED ORDER — DEXAMETHASONE SODIUM PHOSPHATE 10 MG/ML IJ SOLN
INTRAMUSCULAR | Status: DC | PRN
  Administered 2021-10-26: 5 mg via INTRAVENOUS

## 2021-10-26 MED ORDER — SODIUM CHLORIDE 0.9 % IV SOLN: INTRAVENOUS | Status: DC

## 2021-10-26 MED ORDER — FENTANYL CITRATE (PF) 250 MCG/5ML IJ SOLN
INTRAMUSCULAR | Status: DC | PRN
  Administered 2021-10-26: 50 ug via INTRAVENOUS

## 2021-10-26 MED ORDER — SUGAMMADEX SODIUM 200 MG/2ML IV SOLN
INTRAVENOUS | Status: DC | PRN
  Administered 2021-10-26: 120 mg via INTRAVENOUS

## 2021-10-26 MED ORDER — EPHEDRINE SULFATE-NACL 50-0.9 MG/10ML-% IV SOSY
PREFILLED_SYRINGE | INTRAVENOUS | Status: DC | PRN
  Administered 2021-10-26: 5 mg via INTRAVENOUS

## 2021-10-26 MED ORDER — FENTANYL CITRATE (PF) 100 MCG/2ML IJ SOLN: 25.0000 ug | INTRAMUSCULAR | Status: DC | PRN

## 2021-10-26 MED ORDER — ROCURONIUM BROMIDE 10 MG/ML (PF) SYRINGE
PREFILLED_SYRINGE | INTRAVENOUS | Status: DC | PRN
  Administered 2021-10-26: 60 mg via INTRAVENOUS

## 2021-10-26 MED ORDER — CHLORHEXIDINE GLUCONATE CLOTH 2 % EX PADS
6.0000 | MEDICATED_PAD | Freq: Every day | CUTANEOUS | Status: DC
  Administered 2021-10-27 – 2021-10-28 (×2): 6 via TOPICAL

## 2021-10-26 MED ORDER — DIPHENHYDRAMINE HCL 25 MG PO CAPS
25.0000 mg | ORAL_CAPSULE | Freq: Once | ORAL | Status: AC
  Administered 2021-10-26: 25 mg via ORAL
  Filled 2021-10-26: qty 1

## 2021-10-26 MED ORDER — ACETAMINOPHEN 325 MG PO TABS
650.0000 mg | ORAL_TABLET | Freq: Once | ORAL | Status: AC
  Administered 2021-10-26: 650 mg via ORAL
  Filled 2021-10-26: qty 2

## 2021-10-26 MED ORDER — INDOMETHACIN 50 MG RE SUPP
100.0000 mg | Freq: Once | RECTAL | Status: DC
  Filled 2021-10-26: qty 2

## 2021-10-26 MED ORDER — EPINEPHRINE 1 MG/10ML IJ SOSY
PREFILLED_SYRINGE | INTRAMUSCULAR | Status: AC
  Filled 2021-10-26: qty 20

## 2021-10-26 NOTE — Anesthesia Preprocedure Evaluation (Addendum)
Anesthesia Evaluation  Patient identified by MRN, date of birth, ID band Patient awake    Reviewed: Allergy & Precautions, NPO status , Patient's Chart, lab work & pertinent test results  Airway Mallampati: II  TM Distance: >3 FB Neck ROM: Full    Dental  (+) Dental Advisory Given, Missing, Chipped   Pulmonary neg pulmonary ROS,    Pulmonary exam normal breath sounds clear to auscultation       Cardiovascular hypertension, Pt. on medications Normal cardiovascular exam Rhythm:Regular Rate:Normal     Neuro/Psych S/p LUMBAR LAMINECTOMY/DECOMPRESSION MICRODISCECTOMY    GI/Hepatic GERD  ,Obstructive jaundice   Endo/Other  negative endocrine ROS  Renal/GU Renal disease (AKI)     Musculoskeletal  (+) Arthritis ,   Abdominal   Peds  Hematology negative hematology ROS (+) Blood dyscrasia, anemia ,   Anesthesia Other Findings   Reproductive/Obstetrics                            Anesthesia Physical Anesthesia Plan  ASA: 3  Anesthesia Plan: General   Post-op Pain Management:    Induction: Intravenous  PONV Risk Score and Plan: 2 and Dexamethasone and Ondansetron  Airway Management Planned: Oral ETT  Additional Equipment:   Intra-op Plan:   Post-operative Plan: Extubation in OR  Informed Consent: I have reviewed the patients History and Physical, chart, labs and discussed the procedure including the risks, benefits and alternatives for the proposed anesthesia with the patient or authorized representative who has indicated his/her understanding and acceptance.     Dental advisory given  Plan Discussed with: CRNA  Anesthesia Plan Comments:        Anesthesia Quick Evaluation

## 2021-10-26 NOTE — Hospital Course (Signed)
Impression:               - Tiny hiatal hernia.                           - A small amount of food (residue) in the stomach.                           - Normal ampulla, duodenal bulb, first portion of                            the duodenum, second portion of the duodenum, major                            papilla and third portion of the duodenum.                           - The examination was otherwise normal.                           - No specimens collected. Recommendation:           - Clear liquid diet for 6 hours.                           - Continue present medications.                           - Await pathology results.                           - Return to GI clinic PRN.                           - Telephone GI clinic for pathology results   Impression:               - The major papilla appeared normal.                           - A biliary sphincterotomy was performed.                           - Cells for cytology obtained in the lower third of                            the main duct.                           - The biliary tree was swept to better delineate                            the stricture.                           - One plastic stent was placed into the common bile  duct.

## 2021-10-26 NOTE — Progress Notes (Signed)
REPORT called to me regarding the pt coming back to the floor.  He will be  on a clear liq diet for 6hrs.  A stent was placed and  pt is in route to the floor

## 2021-10-26 NOTE — Progress Notes (Signed)
Reached out to [place] to let him know that central tele just called and said around 3:30pm pt had a run of sinus tach arrhythmia but went right back down

## 2021-10-26 NOTE — Progress Notes (Addendum)
Pt left floor for EGD/ERCP in wheelchair

## 2021-10-26 NOTE — Progress Notes (Signed)
PROGRESS NOTE   FLEET HIGHAM  NKN:[id number] DOB: 08/11/1934 DOA: 10/24/2021 PCP: [name], FNP  Brief Narrative:  87 year old community dwelling white male, disease?  A. fib followed by [facility] cardiology [placed on Zio patch in January of this year], HTN, HLD, CKD 3 with chronic back and left foot drop-spinal stenosis laminectomy 03/2020 Dr. Ellene Route  went to visit NP at [facility] family practice 10/23/2021--noted jaundice over the past week-weight loss 5 pounds in the last 10 days-intermittent tarry dark stool-takes Tylenol 1 to 2 g daily-sent for stat CT abdomen pelvis and viral hepatitis panel etc. AST ALT 512/381 WBC 12.4 hepatitis panel negative CT scan showed CBD stricture Transferred from [facility] to [facility] for GI eval and possible ERCP  Hospital-Problem based course  GI bleed? No specific report dark tarry stool See below Dysphagia endoscopy 11/13 no source for bleed, continue Protonix 40 IV twice daily Clear liquid diet today per GI Continue saline 100 cc/H Distal CBD stricture and transaminitis S/p ERCP + plastic stent placement 11/13 continue Zosyn at this time with fluids  OP needs PAthology follow up r/o malignancy  Anemia of blood loss from GI source versus hemolysis Likely blood loss anemia-hemoglobin 7.2 in the setting of dark stool Transfusing 1 U prbc diet as above for now AKI Probable combination of AIN from blood loss anemia in addition to prior to admission use losartan bladder scan showed some retention, but passing passig urine adequately Paroxysmal atrial fibrillation Keep monitors--would hold asa at this time  DVT prophylaxis: scd Code Status: full Family Communication: son [name] [phone] at bedside updated fully  11/13 Disposition:  Status is: Inpatient  Remains inpatient appropriate because: not ready to d/c       Consultants:  Gastroenterology  Procedures: None yet  Antimicrobials: IV  Zosyn   Subjective:  Very sleepy No distress No cp fever nor chills Just back from Endo/ERCP  Objective: Vitals:   10/26/21 1006 10/26/21 1033 10/26/21 1144 10/26/21 1305  BP: (!) 105/50 (!) 115/52 113/61 111/69  Pulse: 81 83 94 89  Resp: 17 16 18 16   Temp: 98.6 F (37 C) 98.8 F (37.1 C) 97.8 F (36.6 C) 97.8 F (36.6 C)  TempSrc:  Oral Tympanic Oral  SpO2: 99% 100% 97% 98%  Weight:      Height:        Intake/Output Summary (Last 24 hours) at 10/26/2021 1313 Last data filed at 10/26/2021 0932 Gross per 24 hour  Intake 3696.14 ml  Output 4 ml  Net 3692.14 ml    Filed Weights   10/24/21 0932 10/25/21 0500 10/26/21 0743  Weight: 67.7 kg 68.5 kg 65.5 kg    Examination:  Mild icterus no pallor sleepy but rousable Chest clear no rales no rhonchi S1-S2 no murmur seems to be in A. fib on monitors intermittently in the 90's Abdomen soft no epigastric tenderness  Psych euthymic coherent no distress No Lower extremity edema  Data Reviewed: personally reviewed   CBC    Component Value Date/Time   WBC 8.8 10/26/2021 0419   RBC 2.14 (L) 10/26/2021 0419   HGB 7.2 (L) 10/26/2021 0419   HGB 9.2 (L) 10/23/2021 1550   HCT 22.0 (L) 10/26/2021 0419   HCT 25.9 (L) 10/23/2021 1550   PLT 296 10/26/2021 0419   PLT 388 10/23/2021 1550   MCV 102.8 (H) 10/26/2021 0419   MCV 97 10/23/2021 1550   MCH 33.6 10/26/2021 0419   MCHC 32.7 10/26/2021 0419   RDW  17.1 (H) 10/26/2021 0419   RDW 13.6 10/23/2021 1550   LYMPHSABS 1.2 10/26/2021 0419   LYMPHSABS 1.1 10/23/2021 1550   MONOABS 1.1 (H) 10/26/2021 0419   EOSABS 0.2 10/26/2021 0419   EOSABS 0.1 10/23/2021 1550   BASOSABS 0.0 10/26/2021 0419   BASOSABS 0.0 10/23/2021 1550   CMP Latest Ref Rng & Units 10/26/2021 10/25/2021 10/24/2021  Glucose 70 - 99 mg/dL 200(H) 139(H) 183(H)  BUN 8 - 23 mg/dL 39(H) 48(H) 50(H)  Creatinine 0.61 - 1.24 mg/dL 2.22(H) 2.22(H) 1.84(H)  Sodium 135 - 145 mmol/L 133(L) 136 134(L)  Potassium  3.5 - 5.1 mmol/L 4.5 4.7 4.7  Chloride 98 - 111 mmol/L 106 105 103  CO2 22 - 32 mmol/L 18(L) 20(L) 20(L)  Calcium 8.9 - 10.3 mg/dL 7.5(L) 8.1(L) 8.5(L)  Total Protein 6.5 - 8.1 g/dL 5.1(L) 5.5(L) 6.7  Total Bilirubin 0.3 - 1.2 mg/dL 6.9(H) 8.7(H) 10.0(H)  Alkaline Phos 38 - 126 U/L 1,051(H) 1,195(H) 1,524(H)  AST 15 - 41 U/L 301(H) 371(H) 474(H)  ALT 0 - 44 U/L 218(H) 259(H) 537(H)     Radiology Studies: DG ERCP  Result Date: 10/26/2021 CLINICAL DATA:  87 year old male with distal common bile duct obstruction. EXAM: ERCP TECHNIQUE: Multiple spot images obtained with the fluoroscopic device and submitted for interpretation post-procedure. FLUOROSCOPY TIME:  Fluoroscopy Time:  6 minutes, 17 seconds Radiation Exposure Index (if provided by the fluoroscopic device): 67 Number of Acquired Spot Images: 7 COMPARISON:  CT abdomen pelvis from 10/23/2021 FINDINGS: Duodenal scope position within the second portion the duodenum. There is retrograde cannulation of the common bile duct. Retrograde cholangiogram demonstrates marked intra and extrahepatic biliary ductal dilation to the level of the mid common bile duct. Common bile duct stent is placed. IMPRESSION: Distal common bile duct obstruction with marked intra and extrahepatic biliary ductal dilation. Placement of a common bile duct stent. Please refer to the Endoscopist's operative note for further procedural details. Ruthann Cancer, MD [facility] Radiology Electronically Signed   By: Ruthann Cancer M.D.   On: 10/26/2021 09:34     Scheduled Meds:  furosemide  20 mg Intravenous Once   indomethacin  100 mg Rectal Once   pantoprazole (PROTONIX) IV  40 mg Intravenous Q12H   tamsulosin  0.4 mg Oral QPC supper   Continuous Infusions:  sodium chloride 100 mL/hr at 10/26/21 1039   piperacillin-tazobactam (ZOSYN)  IV 3.375 g (10/26/21 0400)  Patient in for   LOS: 2 days   Time spent: McC[place], MD [facility] To contact the attending provider between 7A-7P or the covering provider during after hours 7P-7A, please log into the web site [email or web address] and access using universal [place] password for that web site. If you do not have the password, please call the hospital operator.  10/26/2021, 1:13 PM

## 2021-10-26 NOTE — Transfer of Care (Signed)
Immediate Anesthesia Transfer of Care Note  Patient: Terry Hernandez  Procedure(s) Performed: ESOPHAGOGASTRODUODENOSCOPY (EGD) ENDOSCOPIC RETROGRADE CHOLANGIOPANCREATOGRAPHY (ERCP)  Patient Location: PACU  Anesthesia Type:General  Level of Consciousness: drowsy  Airway & Oxygen Therapy: Patient Spontanous Breathing  Post-op Assessment: Report given to RN and Post -op Vital signs reviewed and stable  Post vital signs: Reviewed and stable  Last Vitals:  Vitals Value Taken Time  BP 99/51 10/26/21 0936  Temp    Pulse 109 10/26/21 0937  Resp 17 10/26/21 0938  SpO2 98 % 10/26/21 0937  Vitals shown include unvalidated device data.  Last Pain:  Vitals:   10/26/21 0743  TempSrc: Temporal  PainSc: 0-No pain         Complications: No notable events documented.

## 2021-10-26 NOTE — Progress Notes (Signed)
Initial Nutrition Assessment  INTERVENTION:   Once diet advanced: -Ensure Enlive po TID, each supplement provides 350 kcal and 20 grams of protein -Multivitamin with minerals daily  NUTRITION DIAGNOSIS:   Increased nutrient needs related to acute illness as evidenced by estimated needs.  GOAL:   Patient will meet greater than or equal to 90% of their needs  MONITOR:   Diet advancement, Labs, Weight trends, I & O's  REASON FOR ASSESSMENT:   Malnutrition Screening Tool    ASSESSMENT:   87 y.o. male with medical history significant for prior history of pancreatitis, last colonoscopy about 10 years ago, GERD, OA, HTN, nephrolithiasis, hyperlipidemia, prediabetes, lumbar stenosis with neurogenic claudication recently established care at the [place] [facility] on 10/23/2021. Admitted for CBD obstruction.  Patient currently in [facility] endoscopy for ERCP and EGD this morning. NPO. Per chart review, pt has had persistent poor appetite which has worsened over the last few weeks where he is only consuming a banana or some cereal daily. Having intermittent dysphagia as well with solid foods. Had a notable episode after eating some BBQ a few weeks ago.   Once diet advanced, recommending Ensure supplements and daily MVI for additional kcals and protein given poor POs.  Admission weight: 149 lbs. Current weight: 144 lbs Pt reports 5 lbs of weight loss.  Medications reviewed.  Labs reviewed:  Low Na  NUTRITION - FOCUSED PHYSICAL EXAM:  Unable to complete- working remote  Diet Order:   Diet Order             Diet NPO time specified  Diet effective now                   EDUCATION NEEDS:   Not appropriate for education at this time  Skin:  Skin Assessment: Reviewed RN Assessment  Last BM:  11/12  Height:   Ht Readings from Last 1 Encounters:  10/26/21 5\' 10"  (1.778 m)    Weight:   Wt Readings from Last 1 Encounters:  10/26/21 65.5 kg     BMI:  Body mass index is 20.72 kg/m.  Estimated Nutritional Needs:   Kcal:  1650-1850  Protein:  80-90g  Fluid:  1.8L/day  Clayton Bibles, MS, RD, LDN Inpatient Clinical Dietitian Contact information available via Amion

## 2021-10-26 NOTE — Op Note (Signed)
[facility] Patient Name: Terry Hernandez Procedure Date : 10/26/2021 MRN: [id number] Attending MD: Clarene Essex , MD Date of Birth: 03-Nov-1934 CSN: [id number] Age: 87 Admit Type: Inpatient Procedure:                ERCP Indications:              Biliary dilation on Computed Tomogram Scan, Jaundice Providers:                Clarene Essex, MD, Carlyn Reichert, RN, William Dalton,                            Technician Referring MD:              Medicines:                General Anesthesia Complications:            No immediate complications. Estimated Blood Loss:     Estimated blood loss was minimal. Procedure:                Pre-Anesthesia Assessment:                           - Prior to the procedure, a History and Physical                            was performed, and patient medications and                            allergies were reviewed. The patient's tolerance of                            previous anesthesia was also reviewed. The risks                            and benefits of the procedure and the sedation                            options and risks were discussed with the patient.                            All questions were answered, and informed consent                            was obtained. Prior Anticoagulants: The patient has                            taken no previous anticoagulant or antiplatelet                            agents except for aspirin. ASA Grade Assessment:                            III - A patient with severe systemic disease. After  reviewing the risks and benefits, the patient was                            deemed in satisfactory condition to undergo the                            procedure.                           - Prior to the procedure, a History and Physical                            was performed, and patient medications and                            allergies were reviewed. The patient's tolerance of                             previous anesthesia was also reviewed. The risks                            and benefits of the procedure and the sedation                            options and risks were discussed with the patient.                            All questions were answered, and informed consent                            was obtained. Prior Anticoagulants: The patient has                            taken no previous anticoagulant or antiplatelet                            agents except for aspirin. ASA Grade Assessment:                            III - A patient with severe systemic disease. After                            reviewing the risks and benefits, the patient was                            deemed in satisfactory condition to undergo the                            procedure.                           After obtaining informed consent, the scope was  passed under direct vision. Throughout the                            procedure, the patient's blood pressure, pulse, and                            oxygen saturations were monitored continuously. The                            [id number]-[id number] ([id number]) Olympus duodenoscope was                            introduced through the mouth, and used to inject                            contrast into and used to cannulate the bile duct.                            The ERCP was somewhat difficult due to challenging                            cannulation because of abnormal anatomy. The                            patient tolerated the procedure well. Findings:      The major papilla was normal. Unfortunately on our initial cannulation       we created a false channel but then once we recognize and we       repositioned the sphincterotome and deep selective cannulation was       readily obtained in the distal stricture and mid and proximal       significant dilation was confirmed and we proceeded with a biliary        sphincterotomy was made with a Hydratome sphincterotome using ERBE       electrocautery. There was self limited oozing from the sphincterotomy       which did not require treatment and stopped readily fairly quickly. And       we continued the sphincterotomy until we had adequate biliary drainage       and could get easily to have both sphincterotome in and out of the duct       and we proceeded first with brushings for cells for cytology were       obtained by brushing in the lower third of the main bile duct. To better       delineate the stricture, the biliary tree was swept with an adjustable       9- 12 mm balloon starting at the bifurcation this confirmed the distal       stricture to be about 3 cm in the distal duct and, One 10 Fr by 5 cm       plastic stent with a single external flap and a single internal flap was       placed 4.5 cm into the common bile duct. Bile flowed through the stent.       The stent was in good position. There was no pancreatic duct injection       or wire advancement  throughout the procedure Impression:               - The major papilla appeared normal.                           - A biliary sphincterotomy was performed.                           - Cells for cytology obtained in the lower third of                            the main duct.                           - The biliary tree was swept to better delineate                            the stricture.                           - One plastic stent was placed into the common bile                            duct. Recommendation:           - Clear liquid diet for 6 hours. If doing well may                            have soft solids later today                           - No aspirin, ibuprofen, naproxen, or other                            non-steroidal anti-inflammatory drugs for 5 days.                           - Continue present medications.                           - Await cytology results. If positive  oncology                            referral if negative will need EUS at some point                           - Return to GI clinic PRN. He might need a                            transfusion and reevaluate aspirin needs going                            forward and hopefully can be discharged soon if no  delayed complications with close outpatient                            follow-up as above                           - Telephone GI clinic for pathology results in 3                            days.                           - Telephone GI clinic if symptomatic PRN. Follow                            liver tests back to near normal which can be done                            at his primary care office in [place] and faxed to                            me as well Procedure Code(s):        --- Professional ---                           [phone], Endoscopic retrograde                            cholangiopancreatography (ERCP); with placement of                            endoscopic stent into biliary or pancreatic duct,                            including pre- and post-dilation and guide wire                            passage, when performed, including sphincterotomy,                            when performed, each stent Diagnosis Code(s):        --- Professional ---                           R17, Unspecified jaundice                           K83.8, Other specified diseases of biliary tract CPT copyright 2019 American Medical Association. All rights reserved. The codes documented in this report are preliminary and upon coder review may  be revised to meet current compliance requirements. Clarene Essex, MD 10/26/2021 9:43:23 AM This report has been signed electronically. Number of Addenda: 0

## 2021-10-26 NOTE — Anesthesia Procedure Notes (Signed)
Procedure Name: Intubation Date/Time: 10/26/2021 8:22 AM Performed by: Imagene Riches, CRNA Pre-anesthesia Checklist: Patient identified, Emergency Drugs available, Suction available and Patient being monitored Patient Re-evaluated:Patient Re-evaluated prior to induction Oxygen Delivery Method: Circle System Utilized Preoxygenation: Pre-oxygenation with 100% oxygen Induction Type: IV induction Ventilation: Mask ventilation without difficulty and Oral airway inserted - appropriate to patient size Laryngoscope Size: Sabra Heck and 2 Grade View: Grade I Tube type: Oral Tube size: 7.5 mm Number of attempts: 1 Airway Equipment and Method: Stylet and Oral airway Placement Confirmation: ETT inserted through vocal cords under direct vision, positive ETCO2 and breath sounds checked- equal and bilateral Secured at: 24 cm Tube secured with: Tape Dental Injury: Teeth and Oropharynx as per pre-operative assessment

## 2021-10-26 NOTE — Op Note (Signed)
[facility] Patient Name: Terry Hernandez Procedure Date : 10/26/2021 MRN: [id number] Attending MD: Clarene Essex , MD Date of Birth: [birth year] CSN: [id number] Age: 87 Admit Type: Inpatient Procedure:                Upper GI endoscopy Indications:              Iron deficiency anemia, Dysphagia, Heme positive                            stool Providers:                Clarene Essex, MD, Carlyn Reichert, RN, William Dalton,                            Technician Referring MD:              Medicines:                General Anesthesia Complications:            No immediate complications. Estimated Blood Loss:     Estimated blood loss: none. Procedure:                Pre-Anesthesia Assessment:                           - Prior to the procedure, a History and Physical                            was performed, and patient medications and                            allergies were reviewed. The patient's tolerance of                            previous anesthesia was also reviewed. The risks                            and benefits of the procedure and the sedation                            options and risks were discussed with the patient.                            All questions were answered, and informed consent                            was obtained. Prior Anticoagulants: The patient has                            taken no previous anticoagulant or antiplatelet                            agents except for aspirin. ASA Grade Assessment:                            III - A patient with  severe systemic disease. After                            reviewing the risks and benefits, the patient was                            deemed in satisfactory condition to undergo the                            procedure.                           After obtaining informed consent, the endoscope was                            passed under direct vision. Throughout the                            procedure,  the patient's blood pressure, pulse, and                            oxygen saturations were monitored continuously. The                            [id number]-[id number] ([id number]) Olympus endoscope was introduced                            through the mouth, and advanced to the third part                            of duodenum. The upper GI endoscopy was                            accomplished without difficulty. The patient                            tolerated the procedure well. Scope In: Scope Out: Findings:      A Tiny hiatal hernia was present.      A small amount of food (residue) was found in the gastric body.      The ampulla, duodenal bulb, first portion of the duodenum, second       portion of the duodenum, major papilla and third portion of the duodenum       were normal.      The exam was otherwise without abnormality. Impression:               - Tiny hiatal hernia.                           - A small amount of food (residue) in the stomach.                           - Normal ampulla, duodenal bulb, first portion of  the duodenum, second portion of the duodenum, major                            papilla and third portion of the duodenum.                           - The examination was otherwise normal.                           - No specimens collected. Recommendation:           - Clear liquid diet for 6 hours.                           - Continue present medications.                           - Await pathology results.                           - Return to GI clinic PRN.                           - Telephone GI clinic for pathology results in 3                            days.                           - Perform an ERCP today.                           - No aspirin, ibuprofen, naproxen, or other                            non-steroidal anti-inflammatory drugs for 5 days. Procedure Code(s):        --- Professional ---                           [phone],  Esophagogastroduodenoscopy, flexible,                            transoral; diagnostic, including collection of                            specimen(s) by brushing or washing, when performed                            (separate procedure) Diagnosis Code(s):        --- Professional ---                           K44.9, Diaphragmatic hernia without obstruction or                            gangrene  D50.9, Iron deficiency anemia, unspecified                           R13.10, Dysphagia, unspecified                           R19.5, Other fecal abnormalities CPT copyright 2019 American Medical Association. All rights reserved. The codes documented in this report are preliminary and upon coder review may  be revised to meet current compliance requirements. Clarene Essex, MD 10/26/2021 9:29:57 AM This report has been signed electronically. Number of Addenda: 0

## 2021-10-26 NOTE — Progress Notes (Signed)
Terry Hernandez 8:16 AM  Subjective: Patient seen and examined and case discussed with his son and Dr. Carlean Purl and is actually doing okay without any complaints and he did have a bout of dysphagia a few weeks ago barbecue and is on an aspirin a day but no other blood thinners and has not had any previous endoscopic procedures and has no other complaints  Objective: Vital signs stable afebrile exam please see preassessment evaluation labs reviewed hemoglobin slight drop previous work-up reviewed  Assessment: Anemia guaiac positivity in a patient on aspirin as well as with obstructive jaundice  Plan: The risk benefits methods and success rate of EGD possible dilation and ERCP was thoroughly discussed with the patient and his son and will proceed today with anesthesia assistance with further work-up and plans pending those findings  [facility]  office [phone] After 5PM or if no answer call [phone]

## 2021-10-26 NOTE — Anesthesia Postprocedure Evaluation (Signed)
Anesthesia Post Note  Patient: Terry Hernandez  Procedure(s) Performed: ESOPHAGOGASTRODUODENOSCOPY (EGD) ENDOSCOPIC RETROGRADE CHOLANGIOPANCREATOGRAPHY (ERCP) SPHINCTEROTOMY BILIARY BRUSHING BILIARY STENT PLACEMENT     Patient location during evaluation: PACU Anesthesia Type: General Level of consciousness: awake and alert Pain management: pain level controlled Vital Signs Assessment: post-procedure vital signs reviewed and stable Respiratory status: spontaneous breathing, nonlabored ventilation and respiratory function stable Cardiovascular status: blood pressure returned to baseline and stable Postop Assessment: no apparent nausea or vomiting Anesthetic complications: no   No notable events documented.  Last Vitals:  Vitals:   10/26/21 1600 10/26/21 1705  BP: 119/72 115/70  Pulse: 75 86  Resp: 20 18  Temp: 36.4 C 37.1 C  SpO2: 99% 100%    Last Pain:  Vitals:   10/26/21 1600  TempSrc: Oral  PainSc:                  Catalina Gravel

## 2021-10-27 ENCOUNTER — Encounter (HOSPITAL_COMMUNITY): Payer: Self-pay | Admitting: Gastroenterology

## 2021-10-27 LAB — CBC WITH DIFFERENTIAL/PLATELET
Abs Immature Granulocytes: 0.07 10*3/uL (ref 0.00–0.07)
Basophils Absolute: 0 10*3/uL (ref 0.0–0.1)
Basophils Relative: 0 %
Eosinophils Absolute: 0 10*3/uL (ref 0.0–0.5)
Eosinophils Relative: 0 %
HCT: 26.7 % — ABNORMAL LOW (ref 39.0–52.0)
Hemoglobin: 8.6 g/dL — ABNORMAL LOW (ref 13.0–17.0)
Immature Granulocytes: 1 %
Lymphocytes Relative: 12 %
Lymphs Abs: 1.2 10*3/uL (ref 0.7–4.0)
MCH: 32.7 pg (ref 26.0–34.0)
MCHC: 32.2 g/dL (ref 30.0–36.0)
MCV: 101.5 fL — ABNORMAL HIGH (ref 80.0–100.0)
Monocytes Absolute: 0.8 10*3/uL (ref 0.1–1.0)
Monocytes Relative: 8 %
Neutro Abs: 8 10*3/uL — ABNORMAL HIGH (ref 1.7–7.7)
Neutrophils Relative %: 79 %
Platelets: 292 10*3/uL (ref 150–400)
RBC: 2.63 MIL/uL — ABNORMAL LOW (ref 4.22–5.81)
RDW: 18.3 % — ABNORMAL HIGH (ref 11.5–15.5)
WBC: 10 10*3/uL (ref 4.0–10.5)
nRBC: 0 % (ref 0.0–0.2)

## 2021-10-27 LAB — COMPREHENSIVE METABOLIC PANEL
ALT: 239 U/L — ABNORMAL HIGH (ref 0–44)
AST: 362 U/L — ABNORMAL HIGH (ref 15–41)
Albumin: 1.8 g/dL — ABNORMAL LOW (ref 3.5–5.0)
Alkaline Phosphatase: 1064 U/L — ABNORMAL HIGH (ref 38–126)
Anion gap: 12 (ref 5–15)
BUN: 42 mg/dL — ABNORMAL HIGH (ref 8–23)
CO2: 16 mmol/L — ABNORMAL LOW (ref 22–32)
Calcium: 7.7 mg/dL — ABNORMAL LOW (ref 8.9–10.3)
Chloride: 108 mmol/L (ref 98–111)
Creatinine, Ser: 2.48 mg/dL — ABNORMAL HIGH (ref 0.61–1.24)
GFR, Estimated: 25 mL/min — ABNORMAL LOW (ref >60)
Glucose, Bld: 235 mg/dL — ABNORMAL HIGH (ref 70–99)
Potassium: 4.8 mmol/L (ref 3.5–5.1)
Sodium: 136 mmol/L (ref 135–145)
Total Bilirubin: 7 mg/dL — ABNORMAL HIGH (ref 0.3–1.2)
Total Protein: 5.1 g/dL — ABNORMAL LOW (ref 6.5–8.1)

## 2021-10-27 LAB — PROTIME-INR
INR: 1.2 (ref 0.8–1.2)
Prothrombin Time: 15.2 seconds (ref 11.4–15.2)

## 2021-10-27 LAB — POCT I-STAT CREATININE: Creatinine, Ser: 2 mg/dL — ABNORMAL HIGH (ref 0.61–1.24)

## 2021-10-27 MED ORDER — ENSURE ENLIVE PO LIQD
237.0000 mL | Freq: Three times a day (TID) | ORAL | Status: DC
  Administered 2021-10-28 – 2021-10-29 (×4): 237 mL via ORAL

## 2021-10-27 MED ORDER — METOPROLOL SUCCINATE ER 25 MG PO TB24
12.5000 mg | ORAL_TABLET | Freq: Every day | ORAL | Status: DC
  Administered 2021-10-27 – 2021-10-29 (×3): 12.5 mg via ORAL
  Filled 2021-10-27 (×4): qty 1

## 2021-10-27 MED ORDER — AMOXICILLIN-POT CLAVULANATE 500-125 MG PO TABS
500.0000 mg | ORAL_TABLET | Freq: Two times a day (BID) | ORAL | Status: DC
  Administered 2021-10-27 – 2021-10-29 (×5): 500 mg via ORAL
  Filled 2021-10-27 (×6): qty 1

## 2021-10-27 MED ORDER — AMOXICILLIN-POT CLAVULANATE 875-125 MG PO TABS: 1.0000 | ORAL_TABLET | Freq: Two times a day (BID) | ORAL | Status: DC

## 2021-10-27 MED ORDER — ADULT MULTIVITAMIN W/MINERALS CH
1.0000 | ORAL_TABLET | Freq: Every day | ORAL | Status: DC
  Administered 2021-10-27 – 2021-10-29 (×3): 1 via ORAL
  Filled 2021-10-27 (×3): qty 1

## 2021-10-27 NOTE — Care Management Important Message (Signed)
Important Message  Patient Details  Name: Terry Hernandez MRN: [id number] Date of Birth: [birth year]   Medicare Important Message Given:  Yes     Joyelle Siedlecki Montine Circle 10/27/2021, 4:14 PM

## 2021-10-27 NOTE — Progress Notes (Signed)
[facility] Gastroenterology Progress Note  PENN GRISSETT 87 y.o. 1934-07-23  CC:  Anemia, Jaundice  Subjective: Patient resting comfortably in bed, son at bedside. Patient ate a good breakfast this morning, denies nausea and vomiting. Patient denies any abdominal pain. As bowel movement was Sunday light gray, week prior to that had some dark stools. States he is feeling well.  ROS : Denies epigastric pain, abdominal pain.  Denies fever, chills.   ERCP 10/27/2021 The major papilla appeared normal. - A biliary sphincterotomy was performed. - Cells for cytology obtained in the lower third of the main duct. - The biliary tree was swept to better delineate the stricture. - One plastic stent was placed into the common bile duct.  EGD 10/27/2021 -Tiny hiatal hernia. - A small amount of food (residue) in the stomach. - Normal ampulla, duodenal bulb, first portion of the duodenum, second portion of the duodenum, major papilla and third portion of the duodenum. - The examination was otherwise normal. - No specimens collected.  Objective: Vital signs in last 24 hours: Vitals:   10/27/21 0540 10/27/21 0553  BP: 99/65 (!) 108/54  Pulse: 72 68  Resp: 17 20  Temp: 97.8 F (36.6 C)   SpO2: 98% 99%    Physical Exam: General Alert, in NAD. N+ scleral icterus. Abdomen: Soft. Nontender. Nondistended. Normal bowel sounds. No rebound or guarding.  Skin: + jaundice.  Neuro Alert and oriented x4;  grossly normal neurologically; no asterixis or clonus.   Lab Results: Recent Labs    10/26/21 0419 10/27/21 0437  NA 133* 136  K 4.5 4.8  CL 106 108  CO2 18* 16*  GLUCOSE 200* 235*  BUN 39* 42*  CREATININE 2.22* 2.48*  CALCIUM 7.5* 7.7*   Recent Labs    10/26/21 0419 10/27/21 0437  AST 301* 362*  ALT 218* 239*  ALKPHOS 1,051* 1,064*  BILITOT 6.9* 7.0*  PROT 5.1* 5.1*  ALBUMIN 1.9* 1.8*   Recent Labs    10/26/21 0419 10/27/21 0437  WBC 8.8 10.0  NEUTROABS 6.2 8.0*  HGB  7.2* 8.6*  HCT 22.0* 26.7*  MCV 102.8* 101.5*  PLT 296 292   Recent Labs    10/26/21 0419 10/27/21 0437  LABPROT 15.5* 15.2  INR 1.2 1.2    Lab Results: Results for orders placed or performed during the hospital encounter of 10/24/21 (from the past 48 hour(s))  Comprehensive metabolic panel     Status: Abnormal   Collection Time: 10/25/21  9:20 AM  Result Value Ref Range   Sodium 136 135 - 145 mmol/L   Potassium 4.7 3.5 - 5.1 mmol/L   Chloride 105 98 - 111 mmol/L   CO2 20 (L) 22 - 32 mmol/L   Glucose, Bld 139 (H) 70 - 99 mg/dL    Comment: Glucose reference range applies only to samples taken after fasting for at least 8 hours.   BUN 48 (H) 8 - 23 mg/dL   Creatinine, Ser 2.22 (H) 0.61 - 1.24 mg/dL   Calcium 8.1 (L) 8.9 - 10.3 mg/dL   Total Protein 5.5 (L) 6.5 - 8.1 g/dL   Albumin 2.1 (L) 3.5 - 5.0 g/dL   AST 371 (H) 15 - 41 U/L   ALT 259 (H) 0 - 44 U/L   Alkaline Phosphatase 1,195 (H) 38 - 126 U/L   Total Bilirubin 8.7 (H) 0.3 - 1.2 mg/dL   GFR, Estimated 29 (L) >60 mL/min    Comment: (NOTE) Calculated using the CKD-EPI Creatinine Equation (2021)  Anion gap 11 5 - 15    Comment: Performed at [facility] [address] [place], [place] [address]  Protime-INR     Status: Abnormal   Collection Time: 10/25/21  9:20 AM  Result Value Ref Range   Prothrombin Time 16.1 (H) 11.4 - 15.2 seconds   INR 1.3 (H) 0.8 - 1.2    Comment: (NOTE) INR goal varies based on device and disease states. Performed at [facility], [place] [address], [place], [place] [address]   CBC Once     Status: Abnormal   Collection Time: 10/25/21 12:43 PM  Result Value Ref Range   WBC 9.3 4.0 - 10.5 K/uL   RBC 2.39 (L) 4.22 - 5.81 MIL/uL   Hemoglobin 8.1 (L) 13.0 - 17.0 g/dL   HCT 24.4 (L) 39.0 - 52.0 %   MCV 102.1 (H) 80.0 - 100.0 fL   MCH 33.9 26.0 - 34.0 pg   MCHC 33.2 30.0 - 36.0 g/dL   RDW 17.4 (H) 11.5 - 15.5 %   Platelets 319 150 - 400 K/uL   nRBC 0.0 0.0 - 0.2 %     Comment: Performed at [facility] [address], [place], [place] [address]  Type and screen [place]     Status: None (Preliminary result)   Collection Time: 10/25/21 12:43 PM  Result Value Ref Range   ABO/RH(D) O POS    Antibody Screen NEG    Sample Expiration 10/28/2021,2359    Unit Number [id number]    Blood Component Type RED CELLS,LR    Unit division 00    Status of Unit ALLOCATED    Transfusion Status OK TO TRANSFUSE    Crossmatch Result COMPATIBLE    Unit Number [id number]    Blood Component Type RED CELLS,LR    Unit division 00    Status of Unit ISSUED    Transfusion Status OK TO TRANSFUSE    Crossmatch Result COMPATIBLE   CBC WITH DIFFERENTIAL     Status: Abnormal   Collection Time: 10/26/21  4:19 AM  Result Value Ref Range   WBC 8.8 4.0 - 10.5 K/uL   RBC 2.14 (L) 4.22 - 5.81 MIL/uL   Hemoglobin 7.2 (L) 13.0 - 17.0 g/dL   HCT 22.0 (L) 39.0 - 52.0 %   MCV 102.8 (H) 80.0 - 100.0 fL   MCH 33.6 26.0 - 34.0 pg   MCHC 32.7 30.0 - 36.0 g/dL   RDW 17.1 (H) 11.5 - 15.5 %   Platelets 296 150 - 400 K/uL   nRBC 0.0 0.0 - 0.2 %   Neutrophils Relative % 72 %   Neutro Abs 6.2 1.7 - 7.7 K/uL   Lymphocytes Relative 13 %   Lymphs Abs 1.2 0.7 - 4.0 K/uL   Monocytes Relative 12 %   Monocytes Absolute 1.1 (H) 0.1 - 1.0 K/uL   Eosinophils Relative 2 %   Eosinophils Absolute 0.2 0.0 - 0.5 K/uL   Basophils Relative 0 %   Basophils Absolute 0.0 0.0 - 0.1 K/uL   Immature Granulocytes 1 %   Abs Immature Granulocytes 0.04 0.00 - 0.07 K/uL    Comment: Performed at [facility], [address], [place],  [address]  Comprehensive metabolic panel     Status: Abnormal   Collection Time: 10/26/21  4:19 AM  Result Value Ref Range   Sodium 133 (L) 135 - 145 mmol/L   Potassium 4.5 3.5 - 5.1 mmol/L  Chloride 106 98 - 111 mmol/L   CO2 18 (L) 22 - 32 mmol/L   Glucose, Bld 200 (H) 70 - 99 mg/dL    Comment: Glucose reference range applies only to  samples taken after fasting for at least 8 hours.   BUN 39 (H) 8 - 23 mg/dL   Creatinine, Ser 2.22 (H) 0.61 - 1.24 mg/dL   Calcium 7.5 (L) 8.9 - 10.3 mg/dL   Total Protein 5.1 (L) 6.5 - 8.1 g/dL   Albumin 1.9 (L) 3.5 - 5.0 g/dL   AST 301 (H) 15 - 41 U/L   ALT 218 (H) 0 - 44 U/L   Alkaline Phosphatase 1,051 (H) 38 - 126 U/L   Total Bilirubin 6.9 (H) 0.3 - 1.2 mg/dL   GFR, Estimated 29 (L) >60 mL/min    Comment: (NOTE) Calculated using the CKD-EPI Creatinine Equation (2021)    Anion gap 9 5 - 15    Comment: Performed at [facility], [place] [address], [place], [place] [address]  Protime-INR     Status: Abnormal   Collection Time: 10/26/21  4:19 AM  Result Value Ref Range   Prothrombin Time 15.5 (H) 11.4 - 15.2 seconds   INR 1.2 0.8 - 1.2    Comment: (NOTE) INR goal varies based on device and disease states. Performed at [facility], [place] [address] [place], [place] [address]   Prepare RBC (crossmatch)     Status: None   Collection Time: 10/26/21 11:45 AM  Result Value Ref Range   Order Confirmation      ORDERS RECEIVED TO CROSSMATCH Performed at [facility] [address], [place], [place] [address]   CBC WITH DIFFERENTIAL     Status: Abnormal   Collection Time: 10/27/21  4:37 AM  Result Value Ref Range   WBC 10.0 4.0 - 10.5 K/uL   RBC 2.63 (L) 4.22 - 5.81 MIL/uL   Hemoglobin 8.6 (L) 13.0 - 17.0 g/dL   HCT 26.7 (L) 39.0 - 52.0 %   MCV 101.5 (H) 80.0 - 100.0 fL   MCH 32.7 26.0 - 34.0 pg   MCHC 32.2 30.0 - 36.0 g/dL   RDW 18.3 (H) 11.5 - 15.5 %   Platelets 292 150 - 400 K/uL   nRBC 0.0 0.0 - 0.2 %   Neutrophils Relative % 79 %   Neutro Abs 8.0 (H) 1.7 - 7.7 K/uL   Lymphocytes Relative 12 %   Lymphs Abs 1.2 0.7 - 4.0 K/uL   Monocytes Relative 8 %   Monocytes Absolute 0.8 0.1 - 1.0 K/uL   Eosinophils Relative 0 %   Eosinophils Absolute 0.0 0.0 - 0.5 K/uL   Basophils Relative 0 %   Basophils Absolute 0.0 0.0 - 0.1 K/uL   Immature Granulocytes 1 %    Abs Immature Granulocytes 0.07 0.00 - 0.07 K/uL    Comment: Performed at [facility], [address], [place],  [address]  Comprehensive metabolic panel     Status: Abnormal   Collection Time: 10/27/21  4:37 AM  Result Value Ref Range   Sodium 136 135 - 145 mmol/L   Potassium 4.8 3.5 - 5.1 mmol/L   Chloride 108 98 - 111 mmol/L   CO2 16 (L) 22 - 32 mmol/L   Glucose, Bld 235 (H) 70 - 99 mg/dL    Comment: Glucose reference range applies only to samples taken after fasting for at least 8 hours.   BUN 42 (H)  8 - 23 mg/dL   Creatinine, Ser 2.48 (H) 0.61 - 1.24 mg/dL   Calcium 7.7 (L) 8.9 - 10.3 mg/dL   Total Protein 5.1 (L) 6.5 - 8.1 g/dL   Albumin 1.8 (L) 3.5 - 5.0 g/dL   AST 362 (H) 15 - 41 U/L   ALT 239 (H) 0 - 44 U/L   Alkaline Phosphatase 1,064 (H) 38 - 126 U/L   Total Bilirubin 7.0 (H) 0.3 - 1.2 mg/dL   GFR, Estimated 25 (L) >60 mL/min    Comment: (NOTE) Calculated using the CKD-EPI Creatinine Equation (2021)    Anion gap 12 5 - 15    Comment: Performed at [facility], [place] [address] [place], [place] [address]  Protime-INR     Status: None   Collection Time: 10/27/21  4:37 AM  Result Value Ref Range   Prothrombin Time 15.2 11.4 - 15.2 seconds   INR 1.2 0.8 - 1.2    Comment: (NOTE) INR goal varies based on device and disease states. Performed at [facility], [place] [address] [place],  [address]     Assessment/Plan: Macrocytic anemia with FOBT positive, melena - EGD without evidence of bleeding S/p 1 unit PRBC 10/26/21 WBC 10.0 HGB 8.6 (Baseline 9) MCV 101.5 Platelets 292 Iron 89 Ferritin 499 B12 2,206 With normal iron, elevated ferritin, likely anemia of chronic disease/CKD Can consider colonoscopy or capsule endoscopy if concern for continuing bleeding.  Continue to monitor H&H with transfusion as needed to maintain hemoglobin greater than 7.  Obstructive juandice s/p ERCP With sphincterotomy and stent in CBD 10/27/2021.   Pending  cell cytology for brushing AST 362 (301) (371) ALT 239 (218) (259) Alkphos 0,370(4888) TBili 7.0 (6.9) (8.7) (10) GFR 25  INR 1.2 Slow down trending LFTS- continue to monitor.    Vladimir Crofts PA-C 10/27/2021, 8:18 AM  Contact #  [phone]

## 2021-10-27 NOTE — TOC Initial Note (Signed)
Transition of Care [facility]) - Initial/Assessment Note    Patient Details  Name: Terry Hernandez MRN: [id number] Date of Birth: 1934/06/26  Transition [facility]) CM/SW Contact:    Tom-Johnson, Renea Ee, RN Phone Number: 10/27/2021, 1:34 PM  Clinical Narrative:                 CM spoke with patient and son, [name] at bedside about needs for post hospital transition. Patient states he went to [facility] and was found to be jaundiced with weight loss. Went to [facility] and transferred to [facility] for I consult. Patient states he lives at home with his wife. Had two sons, one recently deceased this past 17-Sep-2023. Has four grandchildren. All live in the same vicinity and supportive. Patient states he was independent with care and drives occasionally prior to hospitalization. Son and family members transport when needed and does errands. Has a walker, cane, crutches, shower chair at home. States Marjorie Smolder is PCP and uses [facility] in [place]. Has chronic back pain, s/p Laminectomy 03/2020 and left foot drop. Had ERCP and EGD done on 11/13 without source of bleeding noted. Stent placed. On IV antibiotics. Medical workup continues. No recommendations noted at this time. CM will follow with needs.    Barriers to Discharge: Continued Medical Work up   Patient Goals and CMS Choice Patient states their goals for this hospitalization and ongoing recovery are:: To go home CMS Medicare.gov Compare Post Acute Care list provided to:: Patient    Expected Discharge Plan and Services     Discharge Planning Services: CM Consult   Living arrangements for the past 2 months: Single Family Home                                      Prior Living Arrangements/Services Living arrangements for the past 2 months: Single Family Home Lives with:: Spouse Patient language and need for interpreter reviewed:: Yes Do you feel safe going back to the place where you  live?: Yes      Need for Family Participation in Patient Care: Yes (Comment) Care giver support system in place?: Yes (comment)   Criminal Activity/Legal Involvement Pertinent to Current Situation/Hospitalization: No - Comment as needed  Activities of Daily Living Home Assistive Devices/Equipment: Grab bars in shower, Walker (specify type) (rollator) ADL Screening (condition at time of admission) Patient's cognitive ability adequate to safely complete daily activities?: Yes Is the patient deaf or have difficulty hearing?: No Does the patient have difficulty seeing, even when wearing glasses/contacts?: No Does the patient have difficulty concentrating, remembering, or making decisions?: No Patient able to express need for assistance with ADLs?: Yes Does the patient have difficulty dressing or bathing?: No Independently performs ADLs?: Yes (appropriate for developmental age) Does the patient have difficulty walking or climbing stairs?: Yes Weakness of Legs: Both Weakness of Arms/Hands: Both  Permission Sought/Granted Permission sought to share information with : Case Manager, Family Supports Permission granted to share information with : Yes, Verbal Permission Granted              Emotional Assessment Appearance:: Appears stated age Attitude/Demeanor/Rapport: Engaged Affect (typically observed): Accepting, Appropriate, Calm, Hopeful Orientation: : Oriented to Self, Oriented to Place, Oriented to  Time, Oriented to Situation Alcohol / Substance Use: Not Applicable Psych Involvement: No (comment)  Admission diagnosis:  Hyperbilirubinemia [E80.6] Obstructive jaundice [K83.1] Gastrointestinal hemorrhage, unspecified  gastrointestinal hemorrhage type [K92.2] Patient Active Problem List   Diagnosis Date Noted   Hyperbilirubinemia 10/24/2021   Biliary obstruction 10/24/2021   HTN (hypertension)    GERD (gastroesophageal reflux disease)    Guaiac positive stools    AKI (acute  kidney injury) ([place])    Leukocytosis    Elevated liver enzymes    Anemia, unspecified    Hyperglycemia    Hyponatremia    Hypotension    Abnormal weight loss    Jaundice    Lumbar stenosis with neurogenic claudication 03/18/2020   Lumbar spinal stenosis 03/18/2020   PREMATURE ATRIAL CONTRACTIONS 09/04/2009   PALPITATIONS 09/04/2009   Hyperlipidemia 09/03/2009   PCP:  [name], FNP Pharmacy:   [facility] [place] [address] Phone: [phone] Fax: [phone]     Social Determinants of Health (SDOH) Interventions    Readmission Risk Interventions No flowsheet data found.

## 2021-10-27 NOTE — Progress Notes (Signed)
PROGRESS NOTE   Terry Hernandez  GYK:[id number] DOB: [birth year] DOA: 10/24/2021 PCP: [name], FNP  Brief Narrative:  87 year old community dwelling white male, disease?  A. fib followed by [facility] cardiology [placed on Zio patch in January of this year], HTN, HLD, CKD 3 with chronic back and left foot drop-spinal stenosis laminectomy 03/2020 Dr. Ellene Route  went to visit NP at [facility] 10/23/2021--noted jaundice over the past week-weight loss 5 pounds in the last 10 days-intermittent tarry dark stool-takes Tylenol 1 to 2 g daily-sent for stat CT abdomen pelvis and viral hepatitis panel etc. AST ALT 512/381 WBC 12.4 hepatitis panel negative CT scan showed CBD stricture Transferred from [facility] to [facility] for GI eval  Underwent on 11/13 both ERCP and EGD without any source for bleeding and stricture had a stent placed Has developed obstructive uropathy in the hospital and mild AKI necessitating treatment awake coherent no distress had a full breakfast this morning including  Hospital-Problem based course  GI bleed? No specific report dark tarry stool--not transfused this admission See below Dysphagia endoscopy 11/13 no source for bleed, continue Protonix 40 IV twice daily Diet graduated to regular diet on 11/14 patient tolerating well Continue saline 100 cc/H Distal CBD stricture and transaminitis S/p ERCP + plastic stent placement 11/13 Zosyn at this time with fluids --- we will cut back to Augmentin and complete a total of 7 days OP needs PAthology follow up r/o malignancy  Anemia of blood loss from GI source versus hemolysis Likely blood loss anemia-hemoglobin 7.2 in the setting of dark stool Transfusing 1 U prbc diet as above for now AKI with obstructive uropathy AIN and possible obstructive pathology Foley catheter placed with good drainage of urine and expect recovery Follows with Dr. Jeffie Pollock in the outpatient setting and continue Flomax 0.4  mg Will require voiding trial in the outpatient setting Paroxysmal atrial fibrillation with some SVT on 11/13 Keep monitors--would hold asa at this time Will start low-dose Toprol 12.5 daily for irritability and monitor trends  DVT prophylaxis: scd Code Status: full Family Communication: son [name] [phone] at bedside updated fully  11/13 Disposition:  Status is: Inpatient  Remains inpatient appropriate because: not ready to d/c       Consultants:  Gastroenterology  Procedures: None yet  Antimicrobials: IV Zosyn   Subjective:  Awake coherent no distress had a full breakfast this morning Interactive engaging in no fever chills nausea vomiting Wants to get up Overall in good spirits  Objective: Vitals:   10/26/21 1705 10/26/21 2150 10/27/21 0540 10/27/21 0553  BP: 115/70 120/74 99/65 (!) 108/54  Pulse: 86 78 72 68  Resp: 18 17 17 20   Temp: 98.7 F (37.1 C) 98.4 F (36.9 C) 97.8 F (36.6 C)   TempSrc:  Oral Oral   SpO2: 100% 99% 98% 99%  Weight:      Height:        Intake/Output Summary (Last 24 hours) at 10/27/2021 0855 Last data filed at 10/27/2021 0556 Gross per 24 hour  Intake 2513.75 ml  Output 1600 ml  Net 913.75 ml    Filed Weights   10/24/21 0932 10/25/21 0500 10/26/21 0743  Weight: 67.7 kg 68.5 kg 65.5 kg    Examination:  Icteric, awake Chest clear no rales no rhonchi S1-S2 no murmur seems to be in A. fib on monitors with some runs of SVT Abdomen soft no epigastric tenderness no rebound no guarding Psych euthymic coherent no distress No Lower extremity  edema  Data Reviewed: personally reviewed   CBC    Component Value Date/Time   WBC 10.0 10/27/2021 0437   RBC 2.63 (L) 10/27/2021 0437   HGB 8.6 (L) 10/27/2021 0437   HGB 9.2 (L) 10/23/2021 1550   HCT 26.7 (L) 10/27/2021 0437   HCT 25.9 (L) 10/23/2021 1550   PLT 292 10/27/2021 0437   PLT 388 10/23/2021 1550   MCV 101.5 (H) 10/27/2021 0437   MCV 97 10/23/2021 1550   MCH 32.7  10/27/2021 0437   MCHC 32.2 10/27/2021 0437   RDW 18.3 (H) 10/27/2021 0437   RDW 13.6 10/23/2021 1550   LYMPHSABS 1.2 10/27/2021 0437   LYMPHSABS 1.1 10/23/2021 1550   MONOABS 0.8 10/27/2021 0437   EOSABS 0.0 10/27/2021 0437   EOSABS 0.1 10/23/2021 1550   BASOSABS 0.0 10/27/2021 0437   BASOSABS 0.0 10/23/2021 1550  Did not receive transfusion this admission CMP Latest Ref Rng & Units 10/27/2021 10/26/2021 10/25/2021  Glucose 70 - 99 mg/dL 235(H) 200(H) 139(H)  BUN 8 - 23 mg/dL 42(H) 39(H) 48(H)  Creatinine 0.61 - 1.24 mg/dL 2.48(H) 2.22(H) 2.22(H)  Sodium 135 - 145 mmol/L 136 133(L) 136  Potassium 3.5 - 5.1 mmol/L 4.8 4.5 4.7  Chloride 98 - 111 mmol/L 108 106 105  CO2 22 - 32 mmol/L 16(L) 18(L) 20(L)  Calcium 8.9 - 10.3 mg/dL 7.7(L) 7.5(L) 8.1(L)  Total Protein 6.5 - 8.1 g/dL 5.1(L) 5.1(L) 5.5(L)  Total Bilirubin 0.3 - 1.2 mg/dL 7.0(H) 6.9(H) 8.7(H)  Alkaline Phos 38 - 126 U/L 1,064(H) 1,051(H) 1,195(H)  AST 15 - 41 U/L 362(H) 301(H) 371(H)  ALT 0 - 44 U/L 239(H) 218(H) 259(H)     Radiology Studies: DG ERCP  Result Date: 10/26/2021 CLINICAL DATA:  87 year old male with distal common bile duct obstruction. EXAM: ERCP TECHNIQUE: Multiple spot images obtained with the fluoroscopic device and submitted for interpretation post-procedure. FLUOROSCOPY TIME:  Fluoroscopy Time:  6 minutes, 17 seconds Radiation Exposure Index (if provided by the fluoroscopic device): 67 Number of Acquired Spot Images: 7 COMPARISON:  CT abdomen pelvis from 10/23/2021 FINDINGS: Duodenal scope position within the second portion the duodenum. There is retrograde cannulation of the common bile duct. Retrograde cholangiogram demonstrates marked intra and extrahepatic biliary ductal dilation to the level of the mid common bile duct. Common bile duct stent is placed. IMPRESSION: Distal common bile duct obstruction with marked intra and extrahepatic biliary ductal dilation. Placement of a common bile duct stent. Please  refer to the Endoscopist's operative note for further procedural details. Ruthann Cancer, MD [facility] Electronically Signed   By: Ruthann Cancer M.D.   On: 10/26/2021 09:34     Scheduled Meds:  Chlorhexidine Gluconate Cloth  6 each Topical Daily   feeding supplement  237 mL Oral TID BM   indomethacin  100 mg Rectal Once   multivitamin with minerals  1 tablet Oral Daily   pantoprazole (PROTONIX) IV  40 mg Intravenous Q12H   tamsulosin  0.4 mg Oral QPC supper   Continuous Infusions:  sodium chloride 100 mL/hr at 10/26/21 1039   piperacillin-tazobactam (ZOSYN)  IV 3.375 g (10/27/21 0400)     LOS: 3 days   Time spent: [place], MD [facility] To contact the attending provider between 7A-7P or the covering provider during after hours 7P-7A, please log into the web site [email or web address] and access using universal [place] password for that web site. If you do not have the password,  please call the hospital operator.  10/27/2021, 8:55 AM

## 2021-10-28 LAB — CBC WITH DIFFERENTIAL/PLATELET
Abs Immature Granulocytes: 0.12 10*3/uL — ABNORMAL HIGH (ref 0.00–0.07)
Basophils Absolute: 0 10*3/uL (ref 0.0–0.1)
Basophils Relative: 0 %
Eosinophils Absolute: 0.1 10*3/uL (ref 0.0–0.5)
Eosinophils Relative: 1 %
HCT: 25.7 % — ABNORMAL LOW (ref 39.0–52.0)
Hemoglobin: 8.8 g/dL — ABNORMAL LOW (ref 13.0–17.0)
Immature Granulocytes: 1 %
Lymphocytes Relative: 10 %
Lymphs Abs: 1.4 10*3/uL (ref 0.7–4.0)
MCH: 34 pg (ref 26.0–34.0)
MCHC: 34.2 g/dL (ref 30.0–36.0)
MCV: 99.2 fL (ref 80.0–100.0)
Monocytes Absolute: 1.2 10*3/uL — ABNORMAL HIGH (ref 0.1–1.0)
Monocytes Relative: 9 %
Neutro Abs: 10.7 10*3/uL — ABNORMAL HIGH (ref 1.7–7.7)
Neutrophils Relative %: 79 %
Platelets: 311 10*3/uL (ref 150–400)
RBC: 2.59 MIL/uL — ABNORMAL LOW (ref 4.22–5.81)
RDW: 18 % — ABNORMAL HIGH (ref 11.5–15.5)
WBC: 13.6 10*3/uL — ABNORMAL HIGH (ref 4.0–10.5)
nRBC: 0 % (ref 0.0–0.2)

## 2021-10-28 LAB — COMPREHENSIVE METABOLIC PANEL
ALT: 285 U/L — ABNORMAL HIGH (ref 0–44)
AST: 461 U/L — ABNORMAL HIGH (ref 15–41)
Albumin: 1.8 g/dL — ABNORMAL LOW (ref 3.5–5.0)
Alkaline Phosphatase: 1134 U/L — ABNORMAL HIGH (ref 38–126)
Anion gap: 9 (ref 5–15)
BUN: 43 mg/dL — ABNORMAL HIGH (ref 8–23)
CO2: 18 mmol/L — ABNORMAL LOW (ref 22–32)
Calcium: 7.5 mg/dL — ABNORMAL LOW (ref 8.9–10.3)
Chloride: 108 mmol/L (ref 98–111)
Creatinine, Ser: 2.35 mg/dL — ABNORMAL HIGH (ref 0.61–1.24)
GFR, Estimated: 27 mL/min — ABNORMAL LOW (ref >60)
Glucose, Bld: 280 mg/dL — ABNORMAL HIGH (ref 70–99)
Potassium: 4.2 mmol/L (ref 3.5–5.1)
Sodium: 135 mmol/L (ref 135–145)
Total Bilirubin: 5.7 mg/dL — ABNORMAL HIGH (ref 0.3–1.2)
Total Protein: 4.9 g/dL — ABNORMAL LOW (ref 6.5–8.1)

## 2021-10-28 MED ORDER — SODIUM CHLORIDE 0.9 % IV SOLN: INTRAVENOUS | Status: DC

## 2021-10-28 NOTE — Progress Notes (Signed)
Inpatient Diabetes Program Recommendations  AACE/ADA: New Consensus Statement on Inpatient Glycemic Control (2015)  Target Ranges:  Prepandial:   less than 140 mg/dL      Peak postprandial:   less than 180 mg/dL (1-2 hours)      Critically ill patients:  140 - 180 mg/dL   Lab Results  Component Value Date   HGBA1C 6.5 (H) 10/24/2021    Review of Glycemic Control Results for Terry Hernandez, Terry Hernandez (MRN [id number]) as of 10/28/2021 10:33  Ref. Range 10/25/2021 09:20 10/26/2021 04:19 10/27/2021 04:37 10/28/2021 03:43  Glucose Latest Ref Range: 70 - 99 mg/dL 139 (H) 200 (H) 235 (H) 280 (H)   Diabetes history: DM2 Outpatient Diabetes medications: None Current orders for Inpatient glycemic control: None  Inpatient Diabetes Program Recommendations:   Received decadron on 11/13.  Serum glucose now elevated.  Might consider CBGs AC/HS and Novolog 0-9 units TID.  If postprandials elevated add meal coverage.  Will continue to follow while inpatient.  Thank you, Reche Dixon, RN, BSN Diabetes Coordinator Inpatient Diabetes Program [phone] (team pager from 8a-5p)

## 2021-10-28 NOTE — Evaluation (Signed)
Physical Therapy Evaluation Patient Details Name: Terry Hernandez MRN: [id number] DOB: [birth year] Today's Date: 10/28/2021  History of Present Illness  Pt is a 87 y.o. M who presents 10/24/2021 with noted jaundice over past week, 5 lb weight loss in last 10 days, intermittent dark stool. Transferred from [facility] to [facility] for GI eval. 11/13 ERCP and EGD without source of bleeding and stricture, had stent placed. Developed AKI with obstructive uropathy. Significant PMH: A. fib, HTN, HLD, CKD 3, laminectomy 03/2020.  Clinical Impression  Pt admitted with above. Pt reports he feels fairly close to his functional baseline. Pt ambulating 200 feet with a Rollator at a supervision level, HR 111-139 afib. Pt with baseline distal BLE weakness and sensory impairments; denies recent falls and states he wears supportive footwear at home. Will continue to benefit from acute PT to promote mobility as tolerated. No follow up needs anticipated.      Recommendations for follow up therapy are one component of a multi-disciplinary discharge planning process, led by the attending physician.  Recommendations may be updated based on patient status, additional functional criteria and insurance authorization.  Follow Up Recommendations No PT follow up    Assistance Recommended at Discharge PRN  Functional Status Assessment Patient has had a recent decline in their functional status and demonstrates the ability to make significant improvements in function in a reasonable and predictable amount of time.  Equipment Recommendations  None recommended by PT    Recommendations for Other Services       Precautions / Restrictions Precautions Precautions: Fall;Other (comment) Precaution Comments: watch HR Restrictions Weight Bearing Restrictions: No      Mobility  Bed Mobility Overal bed mobility: Modified Independent                  Transfers Overall transfer level: Needs assistance Equipment  used: Rollator (4 wheels) Transfers: Sit to/from Stand Sit to Stand: Supervision           General transfer comment: cues for hand placement    Ambulation/Gait Ambulation/Gait assistance: Supervision Gait Distance (Feet): 200 Feet Assistive device: Rollator (4 wheels) Gait Pattern/deviations: Step-through pattern;Decreased stride length;Decreased dorsiflexion - right;Decreased dorsiflexion - left;Narrow base of support Gait velocity: decreased     General Gait Details: guarded posture, increased scapular elevation, decreased bilateral heel strike and narrow BOS.  Stairs            Wheelchair Mobility    Modified Rankin (Stroke Patients Only)       Balance Overall balance assessment: Needs assistance Sitting-balance support: Feet supported Sitting balance-Leahy Scale: Good     Standing balance support: No upper extremity supported;During functional activity Standing balance-Leahy Scale: Fair                               Pertinent Vitals/Pain Pain Assessment: No/denies pain    Home Living Family/patient expects to be discharged to:: Private residence Living Arrangements: Spouse/significant other Available Help at Discharge: Family Type of Home: House Home Access: Stairs to enter Entrance Stairs-Rails: Right Entrance Stairs-Number of Steps: 2   Home Layout: One level Home Equipment: Conservation officer, nature (2 wheels);Rollator (4 wheels);BSC/3in1;Crutches;Shower seat      Prior Function Prior Level of Function : Independent/Modified Independent             Mobility Comments: uses Rollator or furniture walks, drives intermittently, independent ADL's. denies falls       Hand Dominance  Extremity/Trunk Assessment   Upper Extremity Assessment Upper Extremity Assessment: Overall WFL for tasks assessed    Lower Extremity Assessment Lower Extremity Assessment: RLE deficits/detail;LLE deficits/detail RLE Sensation: decreased light  touch (distally) LLE Sensation: decreased light touch (distally)       Communication   Communication: No difficulties  Cognition Arousal/Alertness: Awake/alert Behavior During Therapy: WFL for tasks assessed/performed Overall Cognitive Status: Within Functional Limits for tasks assessed                                          General Comments      Exercises     Assessment/Plan    PT Assessment Patient needs continued PT services  PT Problem List Decreased strength;Decreased activity tolerance;Decreased balance;Decreased mobility;Impaired sensation       PT Treatment Interventions DME instruction;Gait training;Stair training;Functional mobility training;Therapeutic activities;Therapeutic exercise;Balance training;Patient/family education    PT Goals (Current goals can be found in the Care Plan section)  Acute Rehab PT Goals Patient Stated Goal: go home PT Goal Formulation: With patient Time For Goal Achievement: 11/11/21 Potential to Achieve Goals: Good    Frequency Min 3X/week   Barriers to discharge        Co-evaluation               AM-PAC PT "6 Clicks" Mobility  Outcome Measure Help needed turning from your back to your side while in a flat bed without using bedrails?: None Help needed moving from lying on your back to sitting on the side of a flat bed without using bedrails?: None Help needed moving to and from a bed to a chair (including a wheelchair)?: A Little Help needed standing up from a chair using your arms (e.g., wheelchair or bedside chair)?: A Little Help needed to walk in hospital room?: A Little Help needed climbing 3-5 steps with a railing? : A Little 6 Click Score: 20    End of Session Equipment Utilized During Treatment: Gait belt Activity Tolerance: Patient tolerated treatment well Patient left: in chair;with call bell/phone within reach Nurse Communication: Mobility status PT Visit Diagnosis: Unsteadiness on feet  (R26.81);Other abnormalities of gait and mobility (R26.89)    Time: 6314-9702 PT Time Calculation (min) (ACUTE ONLY): 26 min   Charges:   PT Evaluation $PT Eval Moderate Complexity: 1 Mod PT Treatments $Therapeutic Activity: 8-22 mins        Wyona Almas, PT, DPT Acute Rehabilitation Services Pager [phone] Office [phone]   Deno Etienne 10/28/2021, 12:59 PM

## 2021-10-28 NOTE — Progress Notes (Signed)
[facility] Gastroenterology Progress Note  CHENG DEC 87 y.o. 11/25/1934  CC:  Anemia, Jaundice  Subjective: Patient resting comfortably in bed, son at bedside. Patient ate a good breakfast this morning, denies nausea and vomiting. Patient denies any abdominal pain. As bowel movement this AM, some dark stools but no melena.   States he is feeling well and would like to go home, wife is retired Marine scientist and states he will be well taken care of.   ROS : Denies epigastric pain, abdominal pain.  Denies fever, chills.  ERCP 10/27/2021 The major papilla appeared normal. - A biliary sphincterotomy was performed. - Cells for cytology obtained in the lower third of the main duct. - The biliary tree was swept to better delineate the stricture. - One plastic stent was placed into the common bile duct.  EGD 10/27/2021 -Tiny hiatal hernia. - A small amount of food (residue) in the stomach. - Normal ampulla, duodenal bulb, first portion of the duodenum, second portion of the duodenum, major papilla and third portion of the duodenum. - The examination was otherwise normal. - No specimens collected.  Objective: Vital signs in last 24 hours: Vitals:   10/28/21 0435 10/28/21 0944  BP: (!) 101/57 (!) 110/51  Pulse: 90 93  Resp: 18 17  Temp: 99.5 F (37.5 C) 98.9 F (37.2 C)  SpO2: 98% 98%    Physical Exam: General Alert, in NAD. + scleral icterus. Abdomen: Soft. Nontender. Nondistended. Normal bowel sounds. No rebound or guarding.  Skin: + jaundice.  Neuro Alert and oriented x4;  grossly normal neurologically; no asterixis or clonus.   Lab Results: Recent Labs    10/27/21 0437 10/28/21 0343  NA 136 135  K 4.8 4.2  CL 108 108  CO2 16* 18*  GLUCOSE 235* 280*  BUN 42* 43*  CREATININE 2.48* 2.35*  CALCIUM 7.7* 7.5*   Recent Labs    10/27/21 0437 10/28/21 0343  AST 362* 461*  ALT 239* 285*  ALKPHOS 1,064* 1,134*  BILITOT 7.0* 5.7*  PROT 5.1* 4.9*  ALBUMIN 1.8* 1.8*    Recent Labs    10/27/21 0437 10/28/21 0343  WBC 10.0 13.6*  NEUTROABS 8.0* 10.7*  HGB 8.6* 8.8*  HCT 26.7* 25.7*  MCV 101.5* 99.2  PLT 292 311   Recent Labs    10/26/21 0419 10/27/21 0437  LABPROT 15.5* 15.2  INR 1.2 1.2    Lab Results: Results for orders placed or performed during the hospital encounter of 10/24/21 (from the past 48 hour(s))  Prepare RBC (crossmatch)     Status: None   Collection Time: 10/26/21 11:45 AM  Result Value Ref Range   Order Confirmation      ORDERS RECEIVED TO CROSSMATCH Performed at [facility], [place] [address], [place], [place] [address]   CBC WITH DIFFERENTIAL     Status: Abnormal   Collection Time: 10/27/21  4:37 AM  Result Value Ref Range   WBC 10.0 4.0 - 10.5 K/uL   RBC 2.63 (L) 4.22 - 5.81 MIL/uL   Hemoglobin 8.6 (L) 13.0 - 17.0 g/dL   HCT 26.7 (L) 39.0 - 52.0 %   MCV 101.5 (H) 80.0 - 100.0 fL   MCH 32.7 26.0 - 34.0 pg   MCHC 32.2 30.0 - 36.0 g/dL   RDW 18.3 (H) 11.5 - 15.5 %   Platelets 292 150 - 400 K/uL   nRBC 0.0 0.0 - 0.2 %   Neutrophils Relative % 79 %   Neutro Abs 8.0 (  H) 1.7 - 7.7 K/uL   Lymphocytes Relative 12 %   Lymphs Abs 1.2 0.7 - 4.0 K/uL   Monocytes Relative 8 %   Monocytes Absolute 0.8 0.1 - 1.0 K/uL   Eosinophils Relative 0 %   Eosinophils Absolute 0.0 0.0 - 0.5 K/uL   Basophils Relative 0 %   Basophils Absolute 0.0 0.0 - 0.1 K/uL   Immature Granulocytes 1 %   Abs Immature Granulocytes 0.07 0.00 - 0.07 K/uL    Comment: Performed at [facility] [address] [place], [place] [address]  Comprehensive metabolic panel     Status: Abnormal   Collection Time: 10/27/21  4:37 AM  Result Value Ref Range   Sodium 136 135 - 145 mmol/L   Potassium 4.8 3.5 - 5.1 mmol/L   Chloride 108 98 - 111 mmol/L   CO2 16 (L) 22 - 32 mmol/L   Glucose, Bld 235 (H) 70 - 99 mg/dL    Comment: Glucose reference range applies only to samples taken after fasting for at least 8 hours.   BUN 42 (H) 8 - 23 mg/dL    Creatinine, Ser 2.48 (H) 0.61 - 1.24 mg/dL   Calcium 7.7 (L) 8.9 - 10.3 mg/dL   Total Protein 5.1 (L) 6.5 - 8.1 g/dL   Albumin 1.8 (L) 3.5 - 5.0 g/dL   AST 362 (H) 15 - 41 U/L   ALT 239 (H) 0 - 44 U/L   Alkaline Phosphatase 1,064 (H) 38 - 126 U/L   Total Bilirubin 7.0 (H) 0.3 - 1.2 mg/dL   GFR, Estimated 25 (L) >60 mL/min    Comment: (NOTE) Calculated using the CKD-EPI Creatinine Equation (2021)    Anion gap 12 5 - 15    Comment: Performed at [facility] [address], [place], [place] [address]  Protime-INR     Status: None   Collection Time: 10/27/21  4:37 AM  Result Value Ref Range   Prothrombin Time 15.2 11.4 - 15.2 seconds   INR 1.2 0.8 - 1.2    Comment: (NOTE) INR goal varies based on device and disease states. Performed at [facility], [place] [address], [place], [place] [address]   Comprehensive metabolic panel     Status: Abnormal   Collection Time: 10/28/21  3:43 AM  Result Value Ref Range   Sodium 135 135 - 145 mmol/L   Potassium 4.2 3.5 - 5.1 mmol/L   Chloride 108 98 - 111 mmol/L   CO2 18 (L) 22 - 32 mmol/L   Glucose, Bld 280 (H) 70 - 99 mg/dL    Comment: Glucose reference range applies only to samples taken after fasting for at least 8 hours.   BUN 43 (H) 8 - 23 mg/dL   Creatinine, Ser 2.35 (H) 0.61 - 1.24 mg/dL   Calcium 7.5 (L) 8.9 - 10.3 mg/dL   Total Protein 4.9 (L) 6.5 - 8.1 g/dL   Albumin 1.8 (L) 3.5 - 5.0 g/dL   AST 461 (H) 15 - 41 U/L   ALT 285 (H) 0 - 44 U/L   Alkaline Phosphatase 1,134 (H) 38 - 126 U/L   Total Bilirubin 5.7 (H) 0.3 - 1.2 mg/dL   GFR, Estimated 27 (L) >60 mL/min    Comment: (NOTE) Calculated using the CKD-EPI Creatinine Equation (2021)    Anion gap 9 5 - 15    Comment: Performed at [facility], [place] [address], [place], [place] [address]  CBC with Differential/Platelet  Status: Abnormal   Collection Time: 10/28/21  3:43 AM  Result Value Ref Range   WBC 13.6 (H) 4.0 - 10.5 K/uL   RBC 2.59 (L) 4.22 - 5.81  MIL/uL   Hemoglobin 8.8 (L) 13.0 - 17.0 g/dL   HCT 25.7 (L) 39.0 - 52.0 %   MCV 99.2 80.0 - 100.0 fL   MCH 34.0 26.0 - 34.0 pg   MCHC 34.2 30.0 - 36.0 g/dL   RDW 18.0 (H) 11.5 - 15.5 %   Platelets 311 150 - 400 K/uL   nRBC 0.0 0.0 - 0.2 %   Neutrophils Relative % 79 %   Neutro Abs 10.7 (H) 1.7 - 7.7 K/uL   Lymphocytes Relative 10 %   Lymphs Abs 1.4 0.7 - 4.0 K/uL   Monocytes Relative 9 %   Monocytes Absolute 1.2 (H) 0.1 - 1.0 K/uL   Eosinophils Relative 1 %   Eosinophils Absolute 0.1 0.0 - 0.5 K/uL   Basophils Relative 0 %   Basophils Absolute 0.0 0.0 - 0.1 K/uL   Immature Granulocytes 1 %   Abs Immature Granulocytes 0.12 (H) 0.00 - 0.07 K/uL    Comment: Performed at [facility], [address] [place], [place] [address]    Assessment/Plan: Macrocytic anemia with FOBT positive, melena - EGD without evidence of bleeding S/p 1 unit PRBC 10/26/21 WBC 13.6 (10.0) HGB 8.8 (8.6) (Baseline 9) MCV 101.5 Platelets 292 Iron 89 Ferritin 499 B12 2,206 With normal iron, elevated ferritin, likely anemia of chronic disease/CKD  Leukocytosis White blood cell count from 10-13.6. No fever, chills.   T-max 99.5 Patient was on Zosyn in the hospital, currently on Augmentin 500 mg twice daily.   Continue outpatient  Obstructive juandice s/p ERCP With sphincterotomy and stent in CBD 10/27/2021.   Pending cell cytology for brushing- can follow up outpatient AST 362 (301) (371) ALT 239 (218) (259) Alkphos 6,834(1962) TBili 7.0 (6.9) (8.7) (10) GFR 25  INR 1.2 Slow down trending LFTS- continue to monitor.   Continue Augmentin 500mg  BID outpatient for at least 5-10 days.  Can consider colonoscopy or capsule endoscopy outpatient if concern for continuing bleeding.  This time patient does appear ready from a GI standpoint to be discharged. Will need close follow-up in our office to evaluate for labs CBC and liver function in about 1 week. Will need repeat ERCP with stent removal in about  6 to 8 weeks. We will follow-up on pathology outpatient, potential need for EUS.  Can schedule this outpatient if needed. [facility] will sign off.  Please contact us if we can be of any further assistance during this hospital stay.   Vladimir Crofts PA-C 10/28/2021, 9:46 AM  Contact #  [phone]

## 2021-10-28 NOTE — Progress Notes (Signed)
PROGRESS NOTE   Terry Hernandez  XBD:[id number] DOB: 1934-05-17 DOA: 10/24/2021 PCP: [name], FNP  Brief Narrative:  87 year old community dwelling white male, disease?  A. fib followed by [facility] cardiology [placed on Zio patch in January of this year], HTN, HLD, CKD 3 with chronic back and left foot drop-spinal stenosis laminectomy 03/2020 Dr. Ellene Route  went to visit NP at [facility] family practice 10/23/2021--noted jaundice over the past week-weight loss 5 pounds in the last 10 days-intermittent tarry dark stool-takes Tylenol 1 to 2 g daily-sent for stat CT abdomen pelvis and viral hepatitis panel etc. AST ALT 512/381 WBC 12.4 hepatitis panel negative CT scan showed CBD stricture Transferred from [facility] for GI eval  Underwent on 11/13 both ERCP and EGD without any source for bleeding and stricture had a stent placed Has developed obstructive uropathy in the hospital and mild AKI likely combination CIN and Obstructive uropathy--his kidney function should improve over the next several days and we will work-up after discussion with nephrology  Hospital-Problem based course  GI bleed? No specific report dark tarry stool--not transfused this admission Stool seems normal consistency and caliber as per nursing AKI with obstructive uropathy CIN and possible obstructive pathology--received contrast for CT scan on 11/10 Foley catheter placed with good drainage of urine and expect recovery over the next several days-discussed with renal Dr. Marval Regal 11/15 who agrees with conservative management and continuation of IV fluid 100 cc/H -do not remove Foley catheter  Will need outpatient urodynamic studies Dr. Jeffie Pollock in the outpatient setting and continue Flomax 0.4 mg Dysphagia endoscopy 11/13 no source for bleed, continue Protonix 40 IV twice daily Diet graduated to regular diet on 11/14 patient tolerating well Distal CBD stricture and transaminitis S/p ERCP + plastic  stent placement 11/13 Zosyn changed 11/14 to Augmentin and complete a total of 7 days on 11/16 OP needs Pathology follow up r/o malignancy  Anemia of blood loss from GI source versus hemolysis Likely blood loss anemia-hemoglobin dropped to 7.2 Transfusing 1 U prbc 11/13 diet as above for now no further dark stool hemoglobin stable in the 8 range Paroxysmal atrial fibrillation with some SVT on 11/13 Hesitate to use anticoagulation given recent anemia and will need to be readdressed in the outpatient setting Keep monitors--would hold asa at this time Continue Toprol 12.5 daily for irritability and monitor trends  DVT prophylaxis: scd Code Status: full Family Communication: son [name] [phone] at bedside updated fully 11/14 Disposition:  Status is: Inpatient  Remains inpatient appropriate because: not ready to d/c    Consultants:  Gastroenterology  Procedures: None yet  Antimicrobials: IV Zosyn   Subjective:  Awake coherent feeling strong ambulatory No chest pain no fever no nausea no vomiting  passing good urine   Objective: Vitals:   10/27/21 1300 10/27/21 1700 10/27/21 2117 10/28/21 0435  BP:  116/86 111/73 (!) 101/57  Pulse: 91 81 88 90  Resp: 20 18 18 18   Temp:  98.1 F (36.7 C) 98.6 F (37 C) 99.5 F (37.5 C)  TempSrc:  Oral Oral Oral  SpO2: 97% 100% 100% 98%  Weight:    75.6 kg  Height:        Intake/Output Summary (Last 24 hours) at 10/28/2021 0930 Last data filed at 10/28/2021 0453 Gross per 24 hour  Intake 177 ml  Output 1950 ml  Net -1773 ml    Filed Weights   10/25/21 0500 10/26/21 0743 10/28/21 0435  Weight: 68.5 kg 65.5 kg  75.6 kg    Examination:  Awake coherent no distress EOMI NCAT S1-S2 no murmur abdomen soft no rebound no guarding no Rales, rhonchi on exam Foley in place No lower extremity edema Chest clear no added sound  Data Reviewed: personally reviewed   CBC    Component Value Date/Time   WBC 13.6 (H) 10/28/2021 0343    RBC 2.59 (L) 10/28/2021 0343   HGB 8.8 (L) 10/28/2021 0343   HGB 9.2 (L) 10/23/2021 1550   HCT 25.7 (L) 10/28/2021 0343   HCT 25.9 (L) 10/23/2021 1550   PLT 311 10/28/2021 0343   PLT 388 10/23/2021 1550   MCV 99.2 10/28/2021 0343   MCV 97 10/23/2021 1550   MCH 34.0 10/28/2021 0343   MCHC 34.2 10/28/2021 0343   RDW 18.0 (H) 10/28/2021 0343   RDW 13.6 10/23/2021 1550   LYMPHSABS 1.4 10/28/2021 0343   LYMPHSABS 1.1 10/23/2021 1550   MONOABS 1.2 (H) 10/28/2021 0343   EOSABS 0.1 10/28/2021 0343   EOSABS 0.1 10/23/2021 1550   BASOSABS 0.0 10/28/2021 0343   BASOSABS 0.0 10/23/2021 1550  Did not receive transfusion this admission CMP Latest Ref Rng & Units 10/28/2021 10/27/2021 10/26/2021  Glucose 70 - 99 mg/dL 280(H) 235(H) 200(H)  BUN 8 - 23 mg/dL 43(H) 42(H) 39(H)  Creatinine 0.61 - 1.24 mg/dL 2.35(H) 2.48(H) 2.22(H)  Sodium 135 - 145 mmol/L 135 136 133(L)  Potassium 3.5 - 5.1 mmol/L 4.2 4.8 4.5  Chloride 98 - 111 mmol/L 108 108 106  CO2 22 - 32 mmol/L 18(L) 16(L) 18(L)  Calcium 8.9 - 10.3 mg/dL 7.5(L) 7.7(L) 7.5(L)  Total Protein 6.5 - 8.1 g/dL 4.9(L) 5.1(L) 5.1(L)  Total Bilirubin 0.3 - 1.2 mg/dL 5.7(H) 7.0(H) 6.9(H)  Alkaline Phos 38 - 126 U/L 1,134(H) 1,064(H) 1,051(H)  AST 15 - 41 U/L 461(H) 362(H) 301(H)  ALT 0 - 44 U/L 285(H) 239(H) 218(H)     Radiology Studies: No results found.   Scheduled Meds:  amoxicillin-clavulanate  500 mg Oral BID   Chlorhexidine Gluconate Cloth  6 each Topical Daily   feeding supplement  237 mL Oral TID BM   indomethacin  100 mg Rectal Once   metoprolol succinate  12.5 mg Oral Daily   multivitamin with minerals  1 tablet Oral Daily   pantoprazole (PROTONIX) IV  40 mg Intravenous Q12H   tamsulosin  0.4 mg Oral QPC supper   Continuous Infusions:     LOS: 4 days   Time spent: [place], MD [facility] To contact the attending provider between 7A-7P or the covering provider during after hours 7P-7A, please  log into the web site [email or web address] and access using universal [place] password for that web site. If you do not have the password, please call the hospital operator.  10/28/2021, 9:30 AM

## 2021-10-29 ENCOUNTER — Other Ambulatory Visit: Payer: Self-pay | Admitting: Gastroenterology

## 2021-10-29 ENCOUNTER — Telehealth: Payer: Self-pay | Admitting: Family Medicine

## 2021-10-29 DIAGNOSIS — N179 Acute kidney failure, unspecified: Secondary | ICD-10-CM | POA: Diagnosis not present

## 2021-10-29 DIAGNOSIS — R748 Abnormal levels of other serum enzymes: Secondary | ICD-10-CM | POA: Diagnosis not present

## 2021-10-29 LAB — CBC WITH DIFFERENTIAL/PLATELET
Abs Immature Granulocytes: 0.11 10*3/uL — ABNORMAL HIGH (ref 0.00–0.07)
Basophils Absolute: 0 10*3/uL (ref 0.0–0.1)
Basophils Relative: 0 %
Eosinophils Absolute: 0.2 10*3/uL (ref 0.0–0.5)
Eosinophils Relative: 2 %
HCT: 26.4 % — ABNORMAL LOW (ref 39.0–52.0)
Hemoglobin: 8.8 g/dL — ABNORMAL LOW (ref 13.0–17.0)
Immature Granulocytes: 1 %
Lymphocytes Relative: 13 %
Lymphs Abs: 1.5 10*3/uL (ref 0.7–4.0)
MCH: 33.3 pg (ref 26.0–34.0)
MCHC: 33.3 g/dL (ref 30.0–36.0)
MCV: 100 fL (ref 80.0–100.0)
Monocytes Absolute: 1 10*3/uL (ref 0.1–1.0)
Monocytes Relative: 9 %
Neutro Abs: 8.9 10*3/uL — ABNORMAL HIGH (ref 1.7–7.7)
Neutrophils Relative %: 75 %
Platelets: 306 10*3/uL (ref 150–400)
RBC: 2.64 MIL/uL — ABNORMAL LOW (ref 4.22–5.81)
RDW: 18.1 % — ABNORMAL HIGH (ref 11.5–15.5)
WBC: 11.7 10*3/uL — ABNORMAL HIGH (ref 4.0–10.5)
nRBC: 0 % (ref 0.0–0.2)

## 2021-10-29 LAB — TYPE AND SCREEN
ABO/RH(D): O POS
Antibody Screen: NEGATIVE
Unit division: 0
Unit division: 0

## 2021-10-29 LAB — BPAM RBC
Blood Product Expiration Date: 202212032359
Blood Product Expiration Date: 202212052359
ISSUE DATE / TIME: 202211131242
Unit Type and Rh: 5100
Unit Type and Rh: 5100

## 2021-10-29 LAB — COMPREHENSIVE METABOLIC PANEL
ALT: 345 U/L — ABNORMAL HIGH (ref 0–44)
AST: 523 U/L — ABNORMAL HIGH (ref 15–41)
Albumin: 1.9 g/dL — ABNORMAL LOW (ref 3.5–5.0)
Alkaline Phosphatase: 1242 U/L — ABNORMAL HIGH (ref 38–126)
Anion gap: 8 (ref 5–15)
BUN: 35 mg/dL — ABNORMAL HIGH (ref 8–23)
CO2: 19 mmol/L — ABNORMAL LOW (ref 22–32)
Calcium: 7.7 mg/dL — ABNORMAL LOW (ref 8.9–10.3)
Chloride: 110 mmol/L (ref 98–111)
Creatinine, Ser: 1.98 mg/dL — ABNORMAL HIGH (ref 0.61–1.24)
GFR, Estimated: 33 mL/min — ABNORMAL LOW (ref >60)
Glucose, Bld: 287 mg/dL — ABNORMAL HIGH (ref 70–99)
Potassium: 4.2 mmol/L (ref 3.5–5.1)
Sodium: 137 mmol/L (ref 135–145)
Total Bilirubin: 5.5 mg/dL — ABNORMAL HIGH (ref 0.3–1.2)
Total Protein: 5 g/dL — ABNORMAL LOW (ref 6.5–8.1)

## 2021-10-29 LAB — CULTURE, BLOOD (ROUTINE X 2)
Culture: NO GROWTH
Culture: NO GROWTH
Special Requests: ADEQUATE

## 2021-10-29 LAB — CYTOLOGY - NON PAP

## 2021-10-29 MED ORDER — PANTOPRAZOLE SODIUM 40 MG PO TBEC: 40.0000 mg | DELAYED_RELEASE_TABLET | Freq: Every day | ORAL | 0 refills | Status: AC

## 2021-10-29 MED ORDER — METOPROLOL SUCCINATE ER 25 MG PO TB24: 12.5000 mg | ORAL_TABLET | Freq: Every day | ORAL | 0 refills | Status: DC

## 2021-10-29 MED ORDER — AMOXICILLIN-POT CLAVULANATE 500-125 MG PO TABS: 500.0000 mg | ORAL_TABLET | Freq: Two times a day (BID) | ORAL | 0 refills | Status: AC

## 2021-10-29 NOTE — Plan of Care (Signed)
  Problem: Increased Nutrient Needs (NI-5.1) Goal: Food and/or nutrient delivery Description: Individualized approach for food/nutrient provision. Outcome: Adequate for Discharge   Problem: Acute Rehab PT Goals(only PT should resolve) Goal: Patient Will Transfer Sit To/From Stand Outcome: Adequate for Discharge Goal: Pt Will Ambulate Outcome: Adequate for Discharge Goal: Pt Will Go Up/Down Stairs Outcome: Adequate for Discharge

## 2021-10-29 NOTE — TOC Transition Note (Signed)
Transition of Care [facility]) - CM/SW Discharge Note   Patient Details  Name: TEGH FRANEK MRN: [id number] Date of Birth: Jun 16, 1934  Transition of Care [facility]) CM/SW Contact:  Tom-Johnson, Renea Ee, RN Phone Number: 10/29/2021, 9:54 AM   Clinical Narrative:    Patient is scheduled for discharge home today. Denies any TOC needs. No recommendations noted. Family to transport at discharge. No further TOC needs noted.   Final next level of care: Home/Self Care Barriers to Discharge: Barriers Resolved   Patient Goals and CMS Choice Patient states their goals for this hospitalization and ongoing recovery are:: To go home CMS Medicare.gov Compare Post Acute Care list provided to:: Patient Choice offered to / list presented to : NA  Discharge Placement                       Discharge Plan and Services   Discharge Planning Services: CM Consult            DME Arranged: N/A DME Agency: NA         HH Agency: NA        Social Determinants of Health (SDOH) Interventions     Readmission Risk Interventions No flowsheet data found.

## 2021-10-29 NOTE — Telephone Encounter (Signed)
Appointment scheduled for 11/22 @ 1:00pm.

## 2021-10-29 NOTE — Discharge Summary (Signed)
Physician Discharge Summary  Terry Hernandez JYN:[id number] DOB: 1934/10/01 DOA: 10/24/2021  PCP: [name], FNP  Admit date: 10/24/2021 Discharge date: 10/29/2021  Admitted From: Home Disposition: Home  Recommendations for Outpatient Follow-up:  Follow up with PCP in 1 week with repeat CBC/CMP Outpatient follow-up with GI/Dr. Watt Climes Outpatient follow-up with urology/Dr. Jeffie Pollock.  Continue Foley catheter till evaluation by urology. Follow up in ED if symptoms worsen or new appear   Home Health: No Equipment/Devices: None  Discharge Condition: Stable CODE STATUS: Full Diet recommendation: Heart healthy  Brief/Interim Summary: 87 year old male with history of paroxysmal A. fib followed by [facility] cardiology, hypertension, hyperlipidemia, CKD stage III, chronic back pain, left foot drop, spinal stenosis status postlaminectomy presented with jaundice, weight loss, dark tarry stools.  He was found to have elevated LFTs; CT of the abdomen and pelvis showed CBD stricture.  He was transferred from [facility] for GI evaluation.  He underwent EGD and ERCP which showed no bleeding source but a CBD stricture was found and a stent was placed by GI.  He also was found to have obstructive uropathy in the hospital with mild AKI; Foley catheter was placed.  His kidney function is improving gradually with IV fluids.  GI recommended a total of 10-day course of oral Augmentin with outpatient follow-up with GI.  LFTs are improving.  GI has cleared the patient for discharge.  He will be discharged home today with outpatient follow-up with PCP/GI and urology with indwelling Foley catheter.  Discharge Diagnoses:   ?  GI bleed Acute blood loss anemia -Presented with dark tarry stool.  EGD revealed no source of active bleeding hemoglobin 8.8 today.  Symptoms have improved. -Discharged home on Protonix 40 mg daily.  Outpatient follow-up with GI. -Will not resume aspirin on discharge -Required 1 unit  packed red cell transfusion on 10/26/2021.  Distal CBD stricture with transaminitis -Status post ERCP and plastic stent placement on 10/26/2021 -LFTs improving.  GI has signed off recommending total of 10-day course of antibiotic therapy; discharged on oral Augmentin to complete 10-day course.  Outpatient follow-up with GI who can follow-up on the pathology. -LFTs still elevated but bilirubin improving. -Statin to remain on hold.  Acute kidney injury/obstructive uropathy -Treated with IV fluids and Foley catheter.  Creatinine improving.  Continue Foley catheter upon discharge.  Outpatient follow-up with urology/Dr. Jeffie Pollock.  Hold losartan -Continue silodosin  Paroxysmal A. Fib -Currently mildly tachycardic intermittently.  Continue metoprolol.  Outpatient follow-up with PCP and/or cardiology.  DC aspirin.  Discharge Instructions  Discharge Instructions     Diet - low sodium heart healthy   Complete by: As directed    Increase activity slowly   Complete by: As directed       Allergies as of 10/29/2021       Reactions   Cephalexin Rash   REACTION: rash        Medication List     STOP taking these medications    acetaminophen 500 MG tablet Commonly known as: TYLENOL   aspirin EC 81 MG tablet   atorvastatin 20 MG tablet Commonly known as: LIPITOR   losartan 100 MG tablet Commonly known as: COZAAR   traMADol 50 MG tablet Commonly known as: ULTRAM       TAKE these medications    amoxicillin-clavulanate 500-125 MG tablet Commonly known as: AUGMENTIN Take 1 tablet (500 mg total) by mouth 2 (two) times daily for 5 days.   methocarbamol 500 MG tablet Commonly known as:  ROBAXIN Take 1 tablet (500 mg total) by mouth every 6 (six) hours as needed for muscle spasms.   metoprolol succinate 25 MG 24 hr tablet Commonly known as: TOPROL-XL Take 0.5 tablets (12.5 mg total) by mouth daily.   multivitamin tablet Take 1 tablet by mouth at bedtime.   pantoprazole 40  MG tablet Commonly known as: Protonix Take 1 tablet (40 mg total) by mouth daily.   silodosin 8 MG Caps capsule Commonly known as: RAPAFLO Take 1 capsule (8 mg total) by mouth daily with breakfast.   VITAMIN B COMPLEX PO Take 1 tablet by mouth daily.   VITAMIN C PO Take 1 tablet by mouth daily.        Follow-up Information     [name], FNP. Schedule an appointment as soon as possible for a visit in 1 week(s).   Specialty: Family Medicine Why: with repeat cbc/bmp Contact information: [place] [address] [phone]         Irine Seal, MD. Schedule an appointment as soon as possible for a visit in 1 week(s).   Specialty: Urology Contact information: [place] [address] [phone]         Clarene Essex, MD. Schedule an appointment as soon as possible for a visit in 1 week(s).   Specialty: Gastroenterology Contact information: [address] [place] [address] [phone]                Allergies  Allergen Reactions   Cephalexin Rash    REACTION: rash    Consultations: GI   Procedures/Studies: CT Abdomen Pelvis W Contrast  Result Date: 10/23/2021 CLINICAL DATA:  Abdominal distension, jaundice EXAM: CT ABDOMEN AND PELVIS WITH CONTRAST TECHNIQUE: Multidetector CT imaging of the abdomen and pelvis was performed using the standard protocol following bolus administration of intravenous contrast. CONTRAST:  50mL OMNIPAQUE IOHEXOL 300 MG/ML  SOLN COMPARISON:  [facility] CT abdomen/pelvis dated 04/16/2020 FINDINGS: Lower chest: Mild subpleural reticulation at the lung bases. Hepatobiliary: Liver is within normal limits. Gallbladder sludge (series 2/image 29), without associated inflammatory changes. Mild intrahepatic and moderate extrahepatic ductal dilatation, new from the prior. Common duct measures 2.4 cm centrally (coronal image 42) and sharply tapers at the ampulla (coronal image 43),  without convincing obstructing mass on CT. Pancreas: Within normal limits. Note is made of peripancreatic inflammatory changes on the prior, which are improved. Spleen: Within normal limits. Adrenals/Urinary Tract: Adrenal glands are within normal limits. Kidneys are within normal limits.  No hydronephrosis. Bladder is within normal limits. Stomach/Bowel: Stomach is within normal limits. Prior inflammatory changes along the duodenal bulb (at the site of possible prior duodenal ulcer) have resolved. No evidence of bowel obstruction. Appendix is not discretely visualized. Mild sigmoid diverticulosis, without evidence of diverticulitis. Vascular/Lymphatic: No evidence of abdominal aortic aneurysm. Atherosclerotic calcifications of the abdominal aorta and branch vessels. No suspicious abdominopelvic lymphadenopathy. Reproductive: Prostate is mildly enlarged and indents the base of the bladder, suggesting BPH. Other: No abdominopelvic ascites. Musculoskeletal: Degenerative changes of the visualized thoracolumbar spine. Moderate compression fracture deformity at L5, chronic. IMPRESSION: Mild intrahepatic and moderate extrahepatic occluded 64 new from the prior. Common duct measures 2.4 cm and abruptly tapers at the ampulla, without convincing obstructing mass on CT. This appearance favors a distal CBD stricture, possibly on the basis of prior pancreatitis/duodenitis given the prior CT. ERCP is suggested. Gallbladder sludge, without associated inflammatory changes. Electronically Signed   By: Henderson Newcomer.D.  On: 10/23/2021 19:58   DG ERCP  Result Date: 10/26/2021 CLINICAL DATA:  87 year old male with distal common bile duct obstruction. EXAM: ERCP TECHNIQUE: Multiple spot images obtained with the fluoroscopic device and submitted for interpretation post-procedure. FLUOROSCOPY TIME:  Fluoroscopy Time:  6 minutes, 17 seconds Radiation Exposure Index (if provided by the fluoroscopic device): 67 Number of  Acquired Spot Images: 7 COMPARISON:  CT abdomen pelvis from 10/23/2021 FINDINGS: Duodenal scope position within the second portion the duodenum. There is retrograde cannulation of the common bile duct. Retrograde cholangiogram demonstrates marked intra and extrahepatic biliary ductal dilation to the level of the mid common bile duct. Common bile duct stent is placed. IMPRESSION: Distal common bile duct obstruction with marked intra and extrahepatic biliary ductal dilation. Placement of a common bile duct stent. Please refer to the Endoscopist's operative note for further procedural details. Ruthann Cancer, MD [facility] Radiology Electronically Signed   By: Ruthann Cancer M.D.   On: 10/26/2021 09:34    EGD -Tiny hiatal hernia. - A small amount of food (residue) in the stomach. - Normal ampulla, duodenal bulb, first portion of the duodenum, second portion of the duodenum, major papilla and third portion of the duodenum. - The examination was otherwise normal. - No specimens collected.  Subjective: Patient seen and examined at bedside.  Feels much better and wants to go home today.  Denies any worsening abdominal, nausea or vomiting.  Tolerating diet.  Discharge Exam: Vitals:   10/28/21 2056 10/29/21 0504  BP: 111/65 (!) 115/57  Pulse: (!) 105 96  Resp: 16 18  Temp: 98.2 F (36.8 C) 99.3 F (37.4 C)  SpO2: 97% 99%    General: Pt is alert, awake, not in acute distress.  Currently on room air. Cardiovascular: rate controlled, S1/S2 + Respiratory: bilateral decreased breath sounds at bases Abdominal: Soft, NT, ND, bowel sounds + Extremities: no edema, no cyanosis    The results of significant diagnostics from this hospitalization (including imaging, microbiology, ancillary and laboratory) are listed below for reference.     Microbiology: Recent Results (from the past 240 hour(s))  Resp Panel by RT-PCR (Flu A&B, Covid) Nasopharyngeal Swab      Status: None   Collection Time: 10/24/21  9:37 AM   Specimen: Nasopharyngeal Swab; Nasopharyngeal(NP) swabs in vial transport medium  Result Value Ref Range Status   SARS Coronavirus 2 by RT PCR NEGATIVE NEGATIVE Final    Comment: (NOTE) SARS-CoV-2 target nucleic acids are NOT DETECTED.  The SARS-CoV-2 RNA is generally detectable in upper respiratory specimens during the acute phase of infection. The lowest concentration of SARS-CoV-2 viral copies this assay can detect is 138 copies/mL. A negative result does not preclude SARS-Cov-2 infection and should not be used as the sole basis for treatment or other patient management decisions. A negative result may occur with  improper specimen collection/handling, submission of specimen other than nasopharyngeal swab, presence of viral mutation(s) within the areas targeted by this assay, and inadequate number of viral copies(<138 copies/mL). A negative result must be combined with clinical observations, patient history, and epidemiological information. The expected result is Negative.  Fact Sheet for Patients:  EntrepreneurPulse.com.au  Fact Sheet for Healthcare Providers:  IncredibleEmployment.be  This test is no t yet approved or cleared by the [place] FDA and  has been authorized for detection and/or diagnosis of SARS-CoV-2 by FDA under an Emergency Use Authorization (EUA). This EUA will remain  in effect (meaning this test can be used)  for the duration of the COVID-19 declaration under Section 564(b)(1) of the Act, 21 U.S.C.section 360bbb-3(b)(1), unless the authorization is terminated  or revoked sooner.       Influenza A by PCR NEGATIVE NEGATIVE Final   Influenza B by PCR NEGATIVE NEGATIVE Final    Comment: (NOTE) The Xpert Xpress SARS-CoV-2/FLU/RSV plus assay is intended as an aid in the diagnosis of influenza from Nasopharyngeal swab specimens and should not be used as a sole  basis for treatment. Nasal washings and aspirates are unacceptable for Xpert Xpress SARS-CoV-2/FLU/RSV testing.  Fact Sheet for Patients: EntrepreneurPulse.com.au  Fact Sheet for Healthcare Providers: IncredibleEmployment.be  This test is not yet approved or cleared by the [place] FDA and has been authorized for detection and/or diagnosis of SARS-CoV-2 by FDA under an Emergency Use Authorization (EUA). This EUA will remain in effect (meaning this test can be used) for the duration of the COVID-19 declaration under Section 564(b)(1) of the Act, 21 U.S.C. section 360bbb-3(b)(1), unless the authorization is terminated or revoked.  Performed at [facility], [address], [place], [place] [address]   Culture, blood (routine x 2)     Status: None (Preliminary result)   Collection Time: 10/24/21 11:44 AM   Specimen: BLOOD RIGHT WRIST  Result Value Ref Range Status   Specimen Description BLOOD RIGHT WRIST  Final   Special Requests   Final    BOTTLES DRAWN AEROBIC AND ANAEROBIC Blood Culture adequate volume   Culture   Final    NO GROWTH 4 DAYS Performed at [facility], [address] [place], [place] [address]    Report Status PENDING  Incomplete  Culture, blood (routine x 2)     Status: None (Preliminary result)   Collection Time: 10/24/21 11:55 AM   Specimen: BLOOD LEFT HAND  Result Value Ref Range Status   Specimen Description BLOOD LEFT HAND  Final   Special Requests   Final    BOTTLES DRAWN AEROBIC AND ANAEROBIC Blood Culture results may not be optimal due to an inadequate volume of blood received in culture bottles   Culture   Final    NO GROWTH 4 DAYS Performed at [facility], [address], [place],  [address]    Report Status PENDING  Incomplete     Labs: BNP (last 3 results) No results for input(s): BNP in the last 8760 hours. Basic Metabolic Panel: Recent Labs  Lab 10/25/21 0920 10/26/21 0419 10/27/21 0437  10/28/21 0343 10/29/21 0736  NA 136 133* 136 135 137  K 4.7 4.5 4.8 4.2 4.2  CL 105 106 108 108 110  CO2 20* 18* 16* 18* 19*  GLUCOSE 139* 200* 235* 280* 287*  BUN 48* 39* 42* 43* 35*  CREATININE 2.22* 2.22* 2.48* 2.35* 1.98*  CALCIUM 8.1* 7.5* 7.7* 7.5* 7.7*   Liver Function Tests: Recent Labs  Lab 10/25/21 0920 10/26/21 0419 10/27/21 0437 10/28/21 0343 10/29/21 0736  AST 371* 301* 362* 461* 523*  ALT 259* 218* 239* 285* 345*  ALKPHOS 1,195* 1,051* 1,064* 1,134* 1,242*  BILITOT 8.7* 6.9* 7.0* 5.7* 5.5*  PROT 5.5* 5.1* 5.1* 4.9* 5.0*  ALBUMIN 2.1* 1.9* 1.8* 1.8* 1.9*   Recent Labs  Lab 10/23/21 1550  LIPASE 78  AMYLASE 78   No results for input(s): AMMONIA in the last 168 hours. CBC: Recent Labs  Lab 10/25/21 0452 10/25/21 1243 10/26/21 0419 10/27/21 0437 10/28/21 0343 10/29/21 0736  WBC 10.0 9.3 8.8 10.0 13.6* 11.7*  NEUTROABS 7.3  --  6.2 8.0* 10.7* 8.9*  HGB 7.6* 8.1* 7.2* 8.6* 8.8* 8.8*  HCT 23.6* 24.4* 22.0* 26.7* 25.7* 26.4*  MCV 103.1* 102.1* 102.8* 101.5* 99.2 100.0  PLT 324 319 296 292 311 306   Cardiac Enzymes: No results for input(s): CKTOTAL, CKMB, CKMBINDEX, TROPONINI in the last 168 hours. BNP: Invalid input(s): POCBNP CBG: No results for input(s): GLUCAP in the last 168 hours. D-Dimer No results for input(s): DDIMER in the last 72 hours. Hgb A1c No results for input(s): HGBA1C in the last 72 hours. Lipid Profile No results for input(s): CHOL, HDL, LDLCALC, TRIG, CHOLHDL, LDLDIRECT in the last 72 hours. Thyroid function studies No results for input(s): TSH, T4TOTAL, T3FREE, THYROIDAB in the last 72 hours.  Invalid input(s): FREET3 Anemia work up No results for input(s): VITAMINB12, FOLATE, FERRITIN, TIBC, IRON, RETICCTPCT in the last 72 hours. Urinalysis    Component Value Date/Time   COLORURINE AMBER (A) 10/24/2021 0937   APPEARANCEUR HAZY (A) 10/24/2021 0937   APPEARANCEUR Clear 05/15/2021 1427   LABSPEC 1.032 (H) 10/24/2021  0937   PHURINE 5.0 10/24/2021 0937   GLUCOSEU NEGATIVE 10/24/2021 0937   HGBUR NEGATIVE 10/24/2021 0937   BILIRUBINUR SMALL (A) 10/24/2021 0937   BILIRUBINUR Negative 05/15/2021 1427   KETONESUR NEGATIVE 10/24/2021 0937   PROTEINUR 100 (A) 10/24/2021 0937   UROBILINOGEN 0.2 05/31/2020 1212   NITRITE NEGATIVE 10/24/2021 0937   LEUKOCYTESUR NEGATIVE 10/24/2021 0937   Sepsis Labs Invalid input(s): PROCALCITONIN,  WBC,  LACTICIDVEN Microbiology Recent Results (from the past 240 hour(s))  Resp Panel by RT-PCR (Flu A&B, Covid) Nasopharyngeal Swab     Status: None   Collection Time: 10/24/21  9:37 AM   Specimen: Nasopharyngeal Swab; Nasopharyngeal(NP) swabs in vial transport medium  Result Value Ref Range Status   SARS Coronavirus 2 by RT PCR NEGATIVE NEGATIVE Final    Comment: (NOTE) SARS-CoV-2 target nucleic acids are NOT DETECTED.  The SARS-CoV-2 RNA is generally detectable in upper respiratory specimens during the acute phase of infection. The lowest concentration of SARS-CoV-2 viral copies this assay can detect is 138 copies/mL. A negative result does not preclude SARS-Cov-2 infection and should not be used as the sole basis for treatment or other patient management decisions. A negative result may occur with  improper specimen collection/handling, submission of specimen other than nasopharyngeal swab, presence of viral mutation(s) within the areas targeted by this assay, and inadequate number of viral copies(<138 copies/mL). A negative result must be combined with clinical observations, patient history, and epidemiological information. The expected result is Negative.  Fact Sheet for Patients:  EntrepreneurPulse.com.au  Fact Sheet for Healthcare Providers:  IncredibleEmployment.be  This test is no t yet approved or cleared by the [place] FDA and  has been authorized for detection and/or diagnosis of SARS-CoV-2 by FDA under an  Emergency Use Authorization (EUA). This EUA will remain  in effect (meaning this test can be used) for the duration of the COVID-19 declaration under Section 564(b)(1) of the Act, 21 U.S.C.section 360bbb-3(b)(1), unless the authorization is terminated  or revoked sooner.       Influenza A by PCR NEGATIVE NEGATIVE Final   Influenza B by PCR NEGATIVE NEGATIVE Final    Comment: (NOTE) The Xpert Xpress SARS-CoV-2/FLU/RSV plus assay is intended as an aid in the diagnosis of influenza from Nasopharyngeal swab specimens and should not be used as a sole basis for treatment. Nasal washings and aspirates are unacceptable for Xpert Xpress SARS-CoV-2/FLU/RSV testing.  Fact Sheet for Patients: EntrepreneurPulse.com.au  Fact  Sheet for Healthcare Providers: IncredibleEmployment.be  This test is not yet approved or cleared by the [place] and has been authorized for detection and/or diagnosis of SARS-CoV-2 by FDA under an Emergency Use Authorization (EUA). This EUA will remain in effect (meaning this test can be used) for the duration of the COVID-19 declaration under Section 564(b)(1) of the Act, 21 U.S.C. section 360bbb-3(b)(1), unless the authorization is terminated or revoked.  Performed at [facility], [address], [place], [place] [address]   Culture, blood (routine x 2)     Status: None (Preliminary result)   Collection Time: 10/24/21 11:44 AM   Specimen: BLOOD RIGHT WRIST  Result Value Ref Range Status   Specimen Description BLOOD RIGHT WRIST  Final   Special Requests   Final    BOTTLES DRAWN AEROBIC AND ANAEROBIC Blood Culture adequate volume   Culture   Final    NO GROWTH 4 DAYS Performed at [facility], [address], [place], [place] [address]    Report Status PENDING  Incomplete  Culture, blood (routine x 2)     Status: None (Preliminary result)   Collection Time: 10/24/21 11:55 AM   Specimen: BLOOD LEFT HAND  Result Value  Ref Range Status   Specimen Description BLOOD LEFT HAND  Final   Special Requests   Final    BOTTLES DRAWN AEROBIC AND ANAEROBIC Blood Culture results may not be optimal due to an inadequate volume of blood received in culture bottles   Culture   Final    NO GROWTH 4 DAYS Performed at [facility], [address], [place], [place] [address]    Report Status PENDING  Incomplete     Time coordinating discharge: 35 minutes  SIGNED:   Aline August, MD  [facility] 10/29/2021, 9:40 AM

## 2021-10-29 NOTE — Plan of Care (Signed)
  Problem: Education: Goal: Knowledge of General Education information will improve Description: Including pain rating scale, medication(s)/side effects and non-pharmacologic comfort measures Outcome: Progressing   Problem: Health Behavior/Discharge Planning: Goal: Ability to manage health-related needs will improve Outcome: Progressing   Problem: Activity: Goal: Risk for activity intolerance will decrease Outcome: Progressing   

## 2021-10-29 NOTE — Progress Notes (Signed)
Patient given discharge instructions and stated understanding. 

## 2021-10-30 ENCOUNTER — Encounter (HOSPITAL_COMMUNITY): Payer: Self-pay | Admitting: Gastroenterology

## 2021-10-30 ENCOUNTER — Other Ambulatory Visit: Payer: Self-pay

## 2021-10-30 ENCOUNTER — Ambulatory Visit (HOSPITAL_COMMUNITY)
Admission: RE | Admit: 2021-10-30 | Discharge: 2021-10-30 | Disposition: A | Discharge status: Home / Self Care | Payer: Medicare Other | Attending: Gastroenterology | Admitting: Gastroenterology

## 2021-10-30 ENCOUNTER — Ambulatory Visit (HOSPITAL_COMMUNITY): Payer: Medicare Other | Admitting: Certified Registered"

## 2021-10-30 ENCOUNTER — Encounter (HOSPITAL_COMMUNITY)
Admission: RE | Disposition: A | Discharge status: Home / Self Care | Payer: Self-pay | Source: Home / Self Care | Attending: Gastroenterology

## 2021-10-30 ENCOUNTER — Ambulatory Visit (HOSPITAL_COMMUNITY): Payer: Medicare Other

## 2021-10-30 DIAGNOSIS — I959 Hypotension, unspecified: Secondary | ICD-10-CM | POA: Diagnosis not present

## 2021-10-30 DIAGNOSIS — C250 Malignant neoplasm of head of pancreas: Secondary | ICD-10-CM | POA: Diagnosis not present

## 2021-10-30 DIAGNOSIS — T85520A Displacement of bile duct prosthesis, initial encounter: Secondary | ICD-10-CM | POA: Diagnosis not present

## 2021-10-30 DIAGNOSIS — Z4659 Encounter for fitting and adjustment of other gastrointestinal appliance and device: Secondary | ICD-10-CM | POA: Diagnosis not present

## 2021-10-30 DIAGNOSIS — K219 Gastro-esophageal reflux disease without esophagitis: Secondary | ICD-10-CM | POA: Diagnosis not present

## 2021-10-30 DIAGNOSIS — I10 Essential (primary) hypertension: Secondary | ICD-10-CM | POA: Diagnosis not present

## 2021-10-30 DIAGNOSIS — Z419 Encounter for procedure for purposes other than remedying health state, unspecified: Secondary | ICD-10-CM

## 2021-10-30 DIAGNOSIS — Z79899 Other long term (current) drug therapy: Secondary | ICD-10-CM | POA: Diagnosis not present

## 2021-10-30 DIAGNOSIS — C801 Malignant (primary) neoplasm, unspecified: Secondary | ICD-10-CM

## 2021-10-30 DIAGNOSIS — E871 Hypo-osmolality and hyponatremia: Secondary | ICD-10-CM | POA: Diagnosis not present

## 2021-10-30 DIAGNOSIS — R17 Unspecified jaundice: Secondary | ICD-10-CM | POA: Diagnosis not present

## 2021-10-30 DIAGNOSIS — Z9889 Other specified postprocedural states: Secondary | ICD-10-CM | POA: Diagnosis not present

## 2021-10-30 HISTORY — PX: STENT REMOVAL: SHX6421

## 2021-10-30 HISTORY — PX: BILIARY STENT PLACEMENT: SHX5538

## 2021-10-30 HISTORY — PX: ERCP: SHX5425

## 2021-10-30 SURGERY — ERCP, WITH INTERVENTION IF INDICATED
Anesthesia: General

## 2021-10-30 MED ORDER — FENTANYL CITRATE (PF) 100 MCG/2ML IJ SOLN: 25.0000 ug | INTRAMUSCULAR | Status: DC | PRN

## 2021-10-30 MED ORDER — SODIUM CHLORIDE 0.9 % IV SOLN
INTRAVENOUS | Status: DC | PRN
  Administered 2021-10-30: 15:00:00 20 mL

## 2021-10-30 MED ORDER — SUGAMMADEX SODIUM 200 MG/2ML IV SOLN
INTRAVENOUS | Status: DC | PRN
  Administered 2021-10-30: 200 mg via INTRAVENOUS

## 2021-10-30 MED ORDER — FENTANYL CITRATE (PF) 100 MCG/2ML IJ SOLN
INTRAMUSCULAR | Status: DC | PRN
  Administered 2021-10-30: 25 ug via INTRAVENOUS
  Administered 2021-10-30: 50 ug via INTRAVENOUS

## 2021-10-30 MED ORDER — SODIUM CHLORIDE 0.9 % IV SOLN: INTRAVENOUS | Status: DC

## 2021-10-30 MED ORDER — LACTATED RINGERS IV SOLN: INTRAVENOUS | Status: DC | PRN

## 2021-10-30 MED ORDER — PHENYLEPHRINE HCL-NACL 20-0.9 MG/250ML-% IV SOLN
INTRAVENOUS | Status: DC | PRN
  Administered 2021-10-30: 25 ug/min via INTRAVENOUS

## 2021-10-30 MED ORDER — LIDOCAINE 2% (20 MG/ML) 5 ML SYRINGE
INTRAMUSCULAR | Status: DC | PRN
  Administered 2021-10-30: 60 mg via INTRAVENOUS

## 2021-10-30 MED ORDER — ONDANSETRON HCL 4 MG/2ML IJ SOLN
INTRAMUSCULAR | Status: DC | PRN
  Administered 2021-10-30: 4 mg via INTRAVENOUS

## 2021-10-30 MED ORDER — PROPOFOL 10 MG/ML IV BOLUS
INTRAVENOUS | Status: DC | PRN
  Administered 2021-10-30: 40 mg via INTRAVENOUS
  Administered 2021-10-30: 90 mg via INTRAVENOUS

## 2021-10-30 MED ORDER — DROPERIDOL 2.5 MG/ML IJ SOLN: 0.6250 mg | Freq: Once | INTRAMUSCULAR | Status: DC | PRN

## 2021-10-30 MED ORDER — ROCURONIUM BROMIDE 10 MG/ML (PF) SYRINGE
PREFILLED_SYRINGE | INTRAVENOUS | Status: DC | PRN
  Administered 2021-10-30: 40 mg via INTRAVENOUS
  Administered 2021-10-30: 20 mg via INTRAVENOUS

## 2021-10-30 MED ORDER — GLUCAGON HCL RDNA (DIAGNOSTIC) 1 MG IJ SOLR
INTRAMUSCULAR | Status: AC
  Filled 2021-10-30: qty 1

## 2021-10-30 MED ORDER — CIPROFLOXACIN IN D5W 400 MG/200ML IV SOLN
INTRAVENOUS | Status: AC
  Filled 2021-10-30: qty 200

## 2021-10-30 MED ORDER — PHENYLEPHRINE 40 MCG/ML (10ML) SYRINGE FOR IV PUSH (FOR BLOOD PRESSURE SUPPORT)
PREFILLED_SYRINGE | INTRAVENOUS | Status: DC | PRN
  Administered 2021-10-30 (×4): 80 ug via INTRAVENOUS

## 2021-10-30 MED ORDER — INDOMETHACIN 50 MG RE SUPP
RECTAL | Status: AC
  Filled 2021-10-30: qty 2

## 2021-10-30 MED ORDER — DEXAMETHASONE SODIUM PHOSPHATE 10 MG/ML IJ SOLN
INTRAMUSCULAR | Status: DC | PRN
  Administered 2021-10-30: 10 mg via INTRAVENOUS

## 2021-10-30 NOTE — Anesthesia Preprocedure Evaluation (Addendum)
Anesthesia Evaluation  Patient identified by MRN, date of birth, ID band Patient awake    Reviewed: Allergy & Precautions, NPO status , Patient's Chart, lab work & pertinent test results, reviewed documented beta blocker date and time   Airway Mallampati: II  TM Distance: >3 FB Neck ROM: Full    Dental no notable dental hx. (+) Dental Advisory Given, Missing, Chipped   Pulmonary pneumonia,    Pulmonary exam normal breath sounds clear to auscultation       Cardiovascular hypertension, Pt. on medications and Pt. on home beta blockers  Rhythm:Irregular Rate:Tachycardia     Neuro/Psych S/p LUMBAR LAMINECTOMY/DECOMPRESSION MICRODISCECTOMY    GI/Hepatic GERD  ,Obstructive jaundice   Endo/Other  negative endocrine ROS  Renal/GU Renal disease (AKI)     Musculoskeletal  (+) Arthritis ,   Abdominal   Peds  Hematology negative hematology ROS (+) Blood dyscrasia, anemia ,   Anesthesia Other Findings   Reproductive/Obstetrics                           Anesthesia Physical  Anesthesia Plan  ASA: 3  Anesthesia Plan: General   Post-op Pain Management:    Induction: Intravenous  PONV Risk Score and Plan: 2 and Dexamethasone, Ondansetron and Treatment may vary due to age or medical condition  Airway Management Planned: Oral ETT  Additional Equipment: None  Intra-op Plan:   Post-operative Plan: Extubation in OR  Informed Consent: I have reviewed the patients History and Physical, chart, labs and discussed the procedure including the risks, benefits and alternatives for the proposed anesthesia with the patient or authorized representative who has indicated his/her understanding and acceptance.     Dental advisory given  Plan Discussed with: CRNA  Anesthesia Plan Comments:        Anesthesia Quick Evaluation

## 2021-10-30 NOTE — Op Note (Signed)
[facility] Patient Name: Terry Hernandez Procedure Date : 10/30/2021 MRN: [id number] Attending MD: Clarene Essex , MD Date of Birth: 1934-03-30 CSN: [id number] Age: 87 Admit Type: Outpatient Procedure:                ERCP Indications:              Jaundice not relieved with plastic stent, Malignant                            tumor of the head of pancreas or cholangiocarcinoma Providers:                Clarene Essex, MD, Jeanella Cara, RN, Lodema Hong Technician, Technician, Cletis Athens, Merchant [employer] officer Referring MD:              Medicines:                General Anesthesia Complications:            No immediate complications. Estimated Blood Loss:     Estimated blood loss: none. Procedure:                Pre-Anesthesia Assessment:                           - Prior to the procedure, a History and Physical                            was performed, and patient medications and                            allergies were reviewed. The patient's tolerance of                            previous anesthesia was also reviewed. The risks                            and benefits of the procedure and the sedation                            options and risks were discussed with the patient.                            All questions were answered, and informed consent                            was obtained. Prior Anticoagulants: The patient has                            taken no previous anticoagulant or antiplatelet                            agents. ASA Grade Assessment: III - A patient with  severe systemic disease. After reviewing the risks                            and benefits, the patient was deemed in                            satisfactory condition to undergo the procedure.                           After obtaining informed consent, the scope was                            passed under direct vision. Throughout the                             procedure, the patient's blood pressure, pulse, and                            oxygen saturations were monitored continuously. The                            TJF-Q180V ([id number] Olympus duodenoscope was                            introduced through the mouth, and used to inject                            contrast into and used to cannulate the bile duct.                            The ERCP was accomplished without difficulty. The                            patient tolerated the procedure well. Scope In: Scope Out: Findings:      One stent which had been placed through the major papilla into the CBD       was present and possibly filled with some debris and we cannulated the       CBD through the stent with the wire using the sphincterotome and       replaced the snare over the wire in an effort to maintain position and       remove the stent but we could not remove it through the channel so the       stent and scope were removed and the stent was recovered and the scope       was reinserted and Deep selective cannulation was readily obtained and       the wire was advanced into the intrahepatics and contrast filled the       intrahepatics and bifurcation and the obvious stricture was confirmed       and we placed one 10 Fr by 6 cm uncovered metal stent with no external       flaps and no internal flaps was placed 5.5 cm into the common bile duct.       Clear fluid flowed through the stent. The stent was in good position.  The introducer and wire were removed and the scope removed and the       patient tolerated the procedure well and there was no pancreatic duct       injection or wire advancement throughout the procedure Impression:               - A previously placed stent was removed as above.                           - One stent was removed from the biliary tree as                            above.                           - One uncovered metal stent was placed  into the                            common bile duct. Recommendation:           - Clear liquid diet for 6 hours.                           - Continue present medications.                           - Return to GI clinic PRN.                           - Telephone GI clinic if symptomatic PRN.                           - Check liver enzymes (AST, ALT, alkaline                            phosphatase, bilirubin) and hemogram with white                            blood cell count and platelets in 1 week. And can                            do at your primary care office and asked them to                            fax me the report Procedure Code(s):        --- Professional ---                           [phone], Endoscopic retrograde                            cholangiopancreatography (ERCP); with removal and                            exchange of stent(s), biliary or pancreatic duct,  including pre- and post-dilation and guide wire                            passage, when performed, including sphincterotomy,                            when performed, each stent exchanged Diagnosis Code(s):        --- Professional ---                           M07.680S, Displacement of bile duct prosthesis,                            initial encounter                           Z46.59, Encounter for fitting and adjustment of                            other gastrointestinal appliance and device                           R17, Unspecified jaundice                           C25.0, Malignant neoplasm of head of pancreas CPT copyright 2019 [employer]. All rights reserved. The codes documented in this report are preliminary and upon coder review may  be revised to meet current compliance requirements. Clarene Essex, MD 10/30/2021 3:16:51 PM This report has been signed electronically. Number of Addenda: 0

## 2021-10-30 NOTE — Progress Notes (Signed)
Terry Hernandez 2:24 PM  Subjective: Patient seen and examined well-known to me from last weekend and has not had any problems at home and we discussed the diagnosis and the procedure  Objective: Vital signs stable afebrile no acute distress exam please see preassessment evaluation  Assessment: Cancer CBD stricture with questionably functioning stent  Plan: Okay to proceed with ERCP and stent change to metal uncovered with anesthesia assistance  [facility]  office [phone] After 5PM or if no answer call [phone]

## 2021-10-30 NOTE — Transfer of Care (Signed)
Immediate Anesthesia Transfer of Care Note  Patient: Terry Hernandez  Procedure(s) Performed: ENDOSCOPIC RETROGRADE CHOLANGIOPANCREATOGRAPHY (ERCP) STENT REMOVAL BILIARY STENT PLACEMENT  Patient Location: Endoscopy Unit  Anesthesia Type:General  Level of Consciousness: awake, alert  and oriented  Airway & Oxygen Therapy: Patient Spontanous Breathing and Patient connected to face mask oxygen  Post-op Assessment: Report given to RN and Post -op Vital signs reviewed and stable  Post vital signs: Reviewed and stable  Last Vitals:  Vitals Value Taken Time  BP 135/81 10/30/21 1520  Temp    Pulse 83 10/30/21 1521  Resp 18 10/30/21 1521  SpO2 100 % 10/30/21 1521  Vitals shown include unvalidated device data.  Last Pain:  Vitals:   10/30/21 1320  TempSrc: Temporal  PainSc: 0-No pain         Complications: No notable events documented.

## 2021-10-30 NOTE — Anesthesia Procedure Notes (Signed)
Procedure Name: Intubation Date/Time: 10/30/2021 2:29 PM Performed by: Myna Bright, CRNA Pre-anesthesia Checklist: Patient identified, Emergency Drugs available, Suction available and Patient being monitored Patient Re-evaluated:Patient Re-evaluated prior to induction Oxygen Delivery Method: Circle System Utilized Preoxygenation: Pre-oxygenation with 100% oxygen Induction Type: IV induction Ventilation: Mask ventilation without difficulty Laryngoscope Size: Mac and 4 Grade View: Grade I Tube type: Oral Tube size: 7.5 mm Number of attempts: 1 Airway Equipment and Method: Stylet and Oral airway Placement Confirmation: ETT inserted through vocal cords under direct vision, positive ETCO2 and breath sounds checked- equal and bilateral Secured at: 22 cm Tube secured with: Tape Dental Injury: Teeth and Oropharynx as per pre-operative assessment

## 2021-10-30 NOTE — Anesthesia Postprocedure Evaluation (Signed)
Anesthesia Post Note  Patient: Terry Hernandez  Procedure(s) Performed: ENDOSCOPIC RETROGRADE CHOLANGIOPANCREATOGRAPHY (ERCP) [place] PLACEMENT     Patient location during evaluation: PACU Anesthesia Type: General Level of consciousness: awake and alert Pain management: pain level controlled Vital Signs Assessment: post-procedure vital signs reviewed and stable Respiratory status: spontaneous breathing, nonlabored ventilation, respiratory function stable and patient connected to nasal cannula oxygen Cardiovascular status: blood pressure returned to baseline and stable Postop Assessment: no apparent nausea or vomiting Anesthetic complications: no   No notable events documented.  Last Vitals:  Vitals:   10/30/21 1530 10/30/21 1551  BP: 133/71 (!) 154/68  Pulse: 89 85  Resp: (!) 24 18  Temp:    SpO2: 100% 100%    Last Pain:  Vitals:   10/30/21 1320  TempSrc: Temporal  PainSc: 0-No pain                 Barnet Glasgow

## 2021-10-30 NOTE — Discharge Instructions (Addendum)
Call if question or problem otherwise clear liquid diet until 9 PM tonight and if doing well may have soft solids or if diet not advanced today may slowly advance tomorrow and specifically call if signs of GI bleeding nausea vomiting abdominal pain or fever or if yellow jaundice not resolved in 1 week and please get lab work as mentioned earlier at primary care office in 1 week and asked them to fax to my office and we will set up oncology appointment ASAP here in [place]: Refer to the procedure report and other information in the discharge instructions given to you for any specific questions about what was found during the examination. If this information does not answer your questions, please call [facility] office at [phone] to clarify.   YOU SHOULD EXPECT: Some feelings of bloating in the abdomen. Passage of more gas than usual. Walking can help get rid of the air that was put into your GI tract during the procedure and reduce the bloating. If you had a lower endoscopy (such as a colonoscopy or flexible sigmoidoscopy) you may notice spotting of blood in your stool or on the toilet paper. Some abdominal soreness may be present for a day or two, also.  DIET: Your first meal following the procedure should be a light meal and then it is ok to progress to your normal diet. A half-sandwich or bowl of soup is an example of a good first meal. Heavy or fried foods are harder to digest and may make you feel nauseous or bloated. Drink plenty of fluids but you should avoid alcoholic beverages for 24 hours. If you had a esophageal dilation, please see attached instructions for diet.    ACTIVITY: Your care partner should take you home directly after the procedure. You should plan to take it easy, moving slowly for the rest of the day. You can resume normal activity the day after the procedure however YOU SHOULD NOT DRIVE, use power tools, machinery or perform tasks that involve  climbing or major physical exertion for 24 hours (because of the sedation medicines used during the test).   SYMPTOMS TO REPORT IMMEDIATELY: A gastroenterologist can be reached at any hour. Please call [phone]  for any of the following symptoms:  Following lower endoscopy (colonoscopy, flexible sigmoidoscopy) Excessive amounts of blood in the stool  Significant tenderness, worsening of abdominal pains  Swelling of the abdomen that is new, acute  Fever of 100 or higher  Following upper endoscopy (EGD, EUS, ERCP, esophageal dilation) Vomiting of blood or coffee ground material  New, significant abdominal pain  New, significant chest pain or pain under the shoulder blades  Painful or persistently difficult swallowing  New shortness of breath  Black, tarry-looking or red, bloody stools  FOLLOW UP:  If any biopsies were taken you will be contacted by phone or by letter within the next 1-3 weeks. Call [phone]  if you have not heard about the biopsies in 3 weeks.  Please also call with any specific questions about appointments or follow up tests. YOU HAD AN ENDOSCOPIC PROCEDURE TODAY: Refer to the procedure report and other information in the discharge instructions given to you for any specific questions about what was found during the examination. If this information does not answer your questions, please call [facility] office at [phone] to clarify.   YOU SHOULD EXPECT: Some feelings of bloating in the abdomen. Passage of more gas than usual. Walking can help get rid of  the air that was put into your GI tract during the procedure and reduce the bloating. If you had a lower endoscopy (such as a colonoscopy or flexible sigmoidoscopy) you may notice spotting of blood in your stool or on the toilet paper. Some abdominal soreness may be present for a day or two, also.  DIET: Your first meal following the procedure should be a light meal and then it is ok to progress to your normal diet. A  half-sandwich or bowl of soup is an example of a good first meal. Heavy or fried foods are harder to digest and may make you feel nauseous or bloated. Drink plenty of fluids but you should avoid alcoholic beverages for 24 hours. If you had a esophageal dilation, please see attached instructions for diet.    ACTIVITY: Your care partner should take you home directly after the procedure. You should plan to take it easy, moving slowly for the rest of the day. You can resume normal activity the day after the procedure however YOU SHOULD NOT DRIVE, use power tools, machinery or perform tasks that involve climbing or major physical exertion for 24 hours (because of the sedation medicines used during the test).   SYMPTOMS TO REPORT IMMEDIATELY: A gastroenterologist can be reached at any hour. Please call [phone]  for any of the following symptoms:  Following lower endoscopy (colonoscopy, flexible sigmoidoscopy) Excessive amounts of blood in the stool  Significant tenderness, worsening of abdominal pains  Swelling of the abdomen that is new, acute  Fever of 100 or higher  Following upper endoscopy (EGD, EUS, ERCP, esophageal dilation) Vomiting of blood or coffee ground material  New, significant abdominal pain  New, significant chest pain or pain under the shoulder blades  Painful or persistently difficult swallowing  New shortness of breath  Black, tarry-looking or red, bloody stools  FOLLOW UP:  If any biopsies were taken you will be contacted by phone or by letter within the next 1-3 weeks. Call [phone]  if you have not heard about the biopsies in 3 weeks.  Please also call with any specific questions about appointments or follow up tests.

## 2021-10-31 ENCOUNTER — Telehealth: Payer: Self-pay

## 2021-10-31 NOTE — Telephone Encounter (Signed)
Transition Care Management Follow-up Telephone Call Date of discharge and from where: [facility] 10/29/21 - GI Bleed, anemia, CBD stricture How have you been since you were released from the hospital? Stable - not much different - still has lots of diarrhea Any questions or concerns? Yes - no home health was ordered and they feel he's in desperate need- only has wife Terry Hernandez to help him and she has a bad back  Items Reviewed: Did the pt receive and understand the discharge instructions provided? Yes  Medications obtained and verified? Yes  Other? No  Any new allergies since your discharge? No  Dietary orders reviewed? Yes Do you have support at home? Yes   Home Care and Equipment/Supplies: Were home health services ordered? no  Were any new equipment or medical supplies ordered?  No  Functional Questionnaire: (I = Independent and D = Dependent) ADLs: D  Bathing/Dressing- D  Meal Prep- D  Eating- I  Maintaining continence- D  Transferring/Ambulation- D  Managing Meds- I  Follow up appointments reviewed:  PCP Hospital f/u appt confirmed? Yes  Scheduled to see Terry Hernandez on 11/04/21 @ 1:00pm. [facility] f/u appt confirmed? Yes  Scheduled to see Terry Hernandez on 12/15 @ 10. Are transportation arrangements needed? No  If their condition worsens, is the pt aware to call PCP or go to the Emergency Dept.? Yes Was the patient provided with contact information for the PCP's office or ED? Yes Was to pt encouraged to call back with questions or concerns? Yes

## 2021-11-03 ENCOUNTER — Encounter (HOSPITAL_COMMUNITY): Payer: Self-pay | Admitting: Gastroenterology

## 2021-11-04 ENCOUNTER — Other Ambulatory Visit: Payer: Self-pay

## 2021-11-04 ENCOUNTER — Encounter: Payer: Self-pay | Admitting: Family Medicine

## 2021-11-04 ENCOUNTER — Ambulatory Visit (INDEPENDENT_AMBULATORY_CARE_PROVIDER_SITE_OTHER): Payer: Medicare Other | Admitting: Family Medicine

## 2021-11-04 VITALS — BP 133/81 | HR 102 | Temp 97.6°F | Ht 70.0 in | Wt 148.6 lb

## 2021-11-04 DIAGNOSIS — Z741 Need for assistance with personal care: Secondary | ICD-10-CM | POA: Diagnosis not present

## 2021-11-04 DIAGNOSIS — K922 Gastrointestinal hemorrhage, unspecified: Secondary | ICD-10-CM

## 2021-11-04 DIAGNOSIS — N179 Acute kidney failure, unspecified: Secondary | ICD-10-CM | POA: Diagnosis not present

## 2021-11-04 DIAGNOSIS — C259 Malignant neoplasm of pancreas, unspecified: Secondary | ICD-10-CM | POA: Diagnosis not present

## 2021-11-04 DIAGNOSIS — Z09 Encounter for follow-up examination after completed treatment for conditions other than malignant neoplasm: Secondary | ICD-10-CM | POA: Diagnosis not present

## 2021-11-04 DIAGNOSIS — I4891 Unspecified atrial fibrillation: Secondary | ICD-10-CM

## 2021-11-04 DIAGNOSIS — R195 Other fecal abnormalities: Secondary | ICD-10-CM

## 2021-11-04 DIAGNOSIS — K831 Obstruction of bile duct: Secondary | ICD-10-CM | POA: Diagnosis not present

## 2021-11-04 DIAGNOSIS — R159 Full incontinence of feces: Secondary | ICD-10-CM

## 2021-11-04 DIAGNOSIS — R32 Unspecified urinary incontinence: Secondary | ICD-10-CM

## 2021-11-04 DIAGNOSIS — Z7409 Other reduced mobility: Secondary | ICD-10-CM | POA: Diagnosis not present

## 2021-11-04 DIAGNOSIS — R531 Weakness: Secondary | ICD-10-CM

## 2021-11-04 DIAGNOSIS — Z978 Presence of other specified devices: Secondary | ICD-10-CM | POA: Diagnosis not present

## 2021-11-04 DIAGNOSIS — Z419 Encounter for procedure for purposes other than remedying health state, unspecified: Secondary | ICD-10-CM

## 2021-11-04 NOTE — Patient Instructions (Signed)
Jaundice, Adult Jaundice is when the skin, the whites of the eyes, and the lining of the mouth and nose (mucous membranes) turn a yellowish color. This is caused by a substance that forms when red blood cells break down (bilirubin). Jaundice is caused by having too much bilirubin in the blood (hyperbilirubinemia). Bilirubin is processed by the liver. Jaundice can be a sign that the liver or the bile system is not working properly. The bile system is made up of the liver, the gallbladder, and the bile ducts. They work together to make, store, and move bile. What are the causes? This condition is caused by a buildup of bilirubin in the blood. An increase in the bilirubin level in the blood can be caused by: Gallstones, if they block the bile ducts. Alcoholic liver disease. Other liver diseases, such as cirrhosis. Cirrhosis is scarring of the liver. Certain cancers that may block the bile ducts, such as pancreatic cancer. Certain infections, such as viral hepatitis. Certain genetic syndromes, such as Gilbert syndrome. Certain medicines, such as acetaminophen, if too much is used. What are the signs or symptoms? Symptoms of this condition include: Yellow color to the skin, the whites of the eyes, or the mucous membranes. Dark brown urine. Light or clay-colored stools (feces). Itchy skin. How is this diagnosed? This condition is diagnosed with medical history, physical exam, blood tests, and imaging tests. You may have additional tests to check for other causes of increased bilirubin levels in your blood or to rule out other possible causes. How is this treated? Treatment for this condition depends on the cause of your jaundice and other underlying conditions. Treatment may include: Stopping the use of certain medicines. Getting fluids through an IV. Taking medicines or using lotions to treat itchy skin. Having surgery, if there is blockage of the bile ducts. Follow these instructions at  home:  Take over-the-counter and prescription medicines only as told by your health care provider. Use skin lotion to relieve itching. Drink enough fluid to keep your urine pale yellow. Do not drink alcohol. Keep all follow-up visits. This is important. Contact a health care provider if: You have a fever. You have swelling or pain in the abdomen. Get help right away if: Your symptoms suddenly get worse. You vomit repeatedly. You vomit blood. You become weak or confused. You have blood in your stool. You become severely dehydrated. Signs of severe dehydration include: A very dry mouth. A severe headache. A rapid, weak pulse. Rapid breathing. Blue lips. These symptoms may represent a serious problem that is an emergency. Do not wait to see if the symptoms will go away. Get medical help right away. Call your local emergency services (911 in the [place] Do not drive yourself to the hospital. Summary Jaundice is a yellowish discoloration of the skin, the whites of the eyes, and the mucous membranes. Jaundice can be a sign that the liver or the bile system is not working properly. Symptoms include a yellow color to the skin or eyes, dark urine, and a change in stool color. Your health care provider will need to do further testing, including blood tests and imaging studies. Get help right away if your symptoms suddenly get worse, you become weak and confused, you vomit, you have blood in your stool, you have a severe headache, or you become dehydrated. This information is not intended to replace advice given to you by your health care provider. Make sure you discuss any questions you have with your health care   provider. Document Revised: 03/13/2021 Document Reviewed: 03/13/2021 Elsevier Patient Education  2022 [employer]  

## 2021-11-04 NOTE — Progress Notes (Addendum)
Established Patient Office Visit  Subjective:  Patient ID: Terry Hernandez, male    DOB: 11-Mar-1934  Age: 87 y.o. MRN: [id number]  CC:  Chief Complaint  Patient presents with   Hospitalization Follow-up    GI Bleed, anemia, CBD stricture    HPI Terry Hernandez presents for hospital follow up.   Today's visit was for Transitional Care Management.  The patient was discharged from [facility] on 11/16 with a primary diagnosis of hyperbilirubinemia, distal CBD stricture with transaminitis, GI bleed, and AKI.   Contact with the patient and/or caregiver, by a clinical staff member, was made on 10/31/21 and was documented as a telephone encounter within the EMR.  Through chart review and discussion with the patient I have determined that management of their condition is of high complexity.   He was admitted to [facility] on 10/24/21 and discharged on 10/29/21 for hyperbilirubinemia, distal CBD stricture with transaminitis, GI bleed, and AKI. He had an ERCP with plastic stent placement done. He was discharged with a 10 day course of augmentin. LFTs were elevated but improving on discharge. Statin has been held. An EGD was done that showed no active GI blood. Hemoglobin was 8.8 at discharge. Started on protonix daily with outpatient GI follow up. He did receive 1 unit of PRBCs. Aspirin has been held. His AKI was treated with IV fluids and foley catheter. Catheter remained at discharge. Losartan has been held. He is scheduled to follow up with urology.   Another ERCP was done 10/30/21 where a metal stent was placed. A malignant tumor of the head of pancreas vs cholangiocarcinoma was noted. He has an appointment with oncology scheduled for 11/11/21.    He reports that he is feeling better overall. He is overall weak but denies dizziness. He is using a walker for ambulation. He has a foley in place. He is having fecal incontinence at times. His wife is having to help with most of his ADLs including  bathing/dressing, ambulation, continence, and meal prep. She is in her 71s and also has impaired ambulation. They would like home health if possible.   He also has an appointment with cardiology on 12/16/21 for tachycardia and a. Fib. Denies chest pain, shortness of breath, edema, dizziness, or visual disturbances.   Denies fever, nausea, or vomiting. Appetite has been improving. Stools remain unchanged- dark, tarry.   Past Medical History:  Diagnosis Date   Arthritis    GERD (gastroesophageal reflux disease)    PMH   Glucose intolerance (impaired glucose tolerance)    High cholesterol    History of kidney stones    HTN (hypertension)    Hyperlipidemia    Lumbar stenosis with neurogenic claudication    Pneumonia    Subcutaneous mass    Wears glasses     Past Surgical History:  Procedure Laterality Date   APPENDECTOMY     BILIARY BRUSHING  10/26/2021   Procedure: BILIARY BRUSHING;  Surgeon: Clarene Essex, MD;  Location: [facility] ENDOSCOPY;  Service: Endoscopy;;   BILIARY STENT PLACEMENT  10/26/2021   Procedure: BILIARY STENT PLACEMENT;  Surgeon: Clarene Essex, MD;  Location: [facility] ENDOSCOPY;  Service: Endoscopy;;   BILIARY STENT PLACEMENT  10/30/2021   Procedure: BILIARY STENT PLACEMENT;  Surgeon: Clarene Essex, MD;  Location: [place];  Service: Endoscopy;;   COLONOSCOPY W/ BIOPSIES AND POLYPECTOMY     ERCP N/A 10/26/2021   Procedure: ENDOSCOPIC RETROGRADE CHOLANGIOPANCREATOGRAPHY (ERCP);  Surgeon: Clarene Essex, MD;  Location: [place];  Service: Endoscopy;  Laterality: N/A;   ERCP N/A 10/30/2021   Procedure: ENDOSCOPIC RETROGRADE CHOLANGIOPANCREATOGRAPHY (ERCP);  Surgeon: Clarene Essex, MD;  Location: [place];  Service: Endoscopy;  Laterality: N/A;   ESOPHAGOGASTRODUODENOSCOPY N/A 10/26/2021   Procedure: ESOPHAGOGASTRODUODENOSCOPY (EGD);  Surgeon: Clarene Essex, MD;  Location: [place];  Service: Endoscopy;  Laterality: N/A;   LUMBAR LAMINECTOMY/DECOMPRESSION MICRODISCECTOMY  Bilateral 03/18/2020   Procedure: Bilateral Lumbar three-four Lumbar four-five Laminotomy/foraminotomy;  Surgeon: Kristeen Miss, MD;  Location: [place];  Service: Neurosurgery;  Laterality: Bilateral;   MASS EXCISION Right 03/18/2020   Procedure: Excision of a Right gluteal subcutaneous mass;  Surgeon: Kristeen Miss, MD;  Location: [place];  Service: Neurosurgery;  Laterality: Right;   MULTIPLE TOOTH EXTRACTIONS     SPHINCTEROTOMY  10/26/2021   Procedure: SPHINCTEROTOMY;  Surgeon: Clarene Essex, MD;  Location: [facility] ENDOSCOPY;  Service: Endoscopy;;   STENT REMOVAL  10/30/2021   Procedure: STENT REMOVAL;  Surgeon: Clarene Essex, MD;  Location: [facility] ENDOSCOPY;  Service: Endoscopy;;    Family History  Problem Relation Age of Onset   Cancer Mother    Stroke Father    Cancer Son        lung    Social History   Socioeconomic History   Marital status: Married    Spouse name: Not on file   Number of children: 2   Years of education: Not on file   Highest education level: Not on file  Occupational History   Not on file  Tobacco Use   Smoking status: Never   Smokeless tobacco: Never  Vaping Use   Vaping Use: Never used  Substance and Sexual Activity   Alcohol use: Never   Drug use: Never   Sexual activity: Not Currently  Other Topics Concern   Not on file  Social History Narrative   Not on file   Social Determinants of Health   Financial Resource Strain: Not on file  Food Insecurity: Not on file  Transportation Needs: Not on file  Physical Activity: Not on file  Stress: Not on file  Social Connections: Not on file  Intimate Partner Violence: Not on file    Outpatient Medications Prior to Visit  Medication Sig Dispense Refill   Ascorbic Acid (VITAMIN C PO) Take 1 tablet by mouth daily.     B Complex Vitamins (VITAMIN B COMPLEX PO) Take 1 tablet by mouth daily.     methocarbamol (ROBAXIN) 500 MG tablet Take 1 tablet (500 mg total) by mouth every 6 (six) hours as needed for muscle spasms.  30 tablet 3   metoprolol succinate (TOPROL-XL) 25 MG 24 hr tablet Take 0.5 tablets (12.5 mg total) by mouth daily. 30 tablet 0   Multiple Vitamin (MULTIVITAMIN) tablet Take 1 tablet by mouth at bedtime.      pantoprazole (PROTONIX) 40 MG tablet Take 1 tablet (40 mg total) by mouth daily. 30 tablet 0   silodosin (RAPAFLO) 8 MG CAPS capsule Take 1 capsule (8 mg total) by mouth daily with breakfast. 30 capsule 11   No facility-administered medications prior to visit.    Allergies  Allergen Reactions   Cephalexin Rash    REACTION: rash    ROS Review of Systems As per HPI.    Objective:    Physical Exam Vitals and nursing note reviewed.  Constitutional:      General: He is not in acute distress.    Appearance: He is not ill-appearing, toxic-appearing or diaphoretic.  HENT:     Head: Normocephalic and atraumatic.  Cardiovascular:     Rate and Rhythm: Tachycardia present. Rhythm irregularly irregular.     Pulses: Normal pulses.     Heart sounds: Normal heart sounds. No murmur heard. Pulmonary:     Effort: Pulmonary effort is normal. No respiratory distress.     Breath sounds: Normal breath sounds. No wheezing, rhonchi or rales.  Chest:     Chest wall: No tenderness.  Abdominal:     General: Bowel sounds are normal. There is no distension.     Palpations: Abdomen is soft.     Tenderness: There is no abdominal tenderness. There is no guarding or rebound.  Musculoskeletal:     Right lower leg: No edema.     Left lower leg: No edema.  Skin:    General: Skin is warm and dry.     Coloration: Skin is jaundiced (mild).  Neurological:     Mental Status: He is alert and oriented to person, place, and time.     Motor: Weakness (generalized) present.     Gait: Gait abnormal (wheelchair).  Psychiatric:        Mood and Affect: Mood normal.        Behavior: Behavior normal.        Thought Content: Thought content normal.        Judgment: Judgment normal.    BP 133/81   Pulse (!)  102   Temp 97.6 F (36.4 C) (Temporal)   Ht _0  (1.778 m)   Wt 148 lb 9.6 oz (67.4 kg)   SpO2 97%   BMI 21.32 kg/m  Wt Readings from Last 3 Encounters:  11/04/21 148 lb 9.6 oz (67.4 kg)  10/30/21 158 lb 11.7 oz (72 kg)  10/28/21 166 lb 10.7 oz (75.6 kg)     Health Maintenance Due  Topic Date Due   Pneumonia Vaccine 45+ Years old (1 - PCV) Never done   URINE MICROALBUMIN  Never done   TETANUS/TDAP  Never done   Zoster Vaccines- Shingrix (1 of 2) Never done    There are no preventive care reminders to display for this patient.  No results found for: TSH Lab Results  Component Value Date   WBC 11.7 (H) 10/29/2021   HGB 8.8 (L) 10/29/2021   HCT 26.4 (L) 10/29/2021   MCV 100.0 10/29/2021   PLT 306 10/29/2021   Lab Results  Component Value Date   NA 137 10/29/2021   K 4.2 10/29/2021   CO2 19 (L) 10/29/2021   GLUCOSE 287 (H) 10/29/2021   BUN 35 (H) 10/29/2021   CREATININE 1.98 (H) 10/29/2021   BILITOT 5.5 (H) 10/29/2021   ALKPHOS 1,242 (H) 10/29/2021   AST 523 (H) 10/29/2021   ALT 345 (H) 10/29/2021   PROT 5.0 (L) 10/29/2021   ALBUMIN 1.9 (L) 10/29/2021   CALCIUM 7.7 (L) 10/29/2021   ANIONGAP 8 10/29/2021   Lab Results  Component Value Date   CHOL 285 (H) 10/25/2021   Lab Results  Component Value Date   HDL <10 (L) 10/25/2021   Lab Results  Component Value Date   LDLCALC NOT CALCULATED 10/25/2021   Lab Results  Component Value Date   TRIG 124 10/25/2021   Lab Results  Component Value Date   CHOLHDL NOT CALCULATED 10/25/2021   Lab Results  Component Value Date   HGBA1C 6.5 (H) 10/24/2021      Assessment & Plan:   Demetreus was seen today for hospitalization follow-up.  Diagnoses and all orders for this visit:  Obstructive jaundice Hyperbilirubinemia Surgery, elective CBG stricture found. Managed by GI. Has had stent placed with ERCP. Labs pending as below. Sees [facility].   -     CMP14+EGFR -     CBC with Differential/Platelet -     Hepatic  Function Panel  GI hemorrhage, unspecified type Managed by GI. No source found. Labs pending.  -     CMP14+EGFR -     CBC with Differential/Platelet  AKI (acute kidney injury) ([place]) Labs pending. Foley in place.  -     CMP14+EGFR -     CBC with Differential/Platelet  Malignant neoplasm of pancreas, unspecified location of malignancy ([place]) Noted with ERCP. Will see oncology on 11/11/21. -     Ambulatory referral to Home Health  Atrial fibrillation, unspecified type ([place]) Asymptomatic. Will see cardiology on 12/16/21.  Urinary incontinence, unspecified type Indwelling Foley catheter present Managed by urology. Follow up scheduled for 11/26/21. HH referral placed.  -     Ambulatory referral to Home Health  Incontinence of feces, unspecified fecal incontinence type Requires assistance with activities of daily living (ADL) Impaired mobility Generalized weakness HH referral placed for nursing, PT, and aide.  -     Ambulatory referral to [facility] discharge follow-up Reviewed hospital records.  -     CBC with Differential -     CMP14+EGFR  Follow-up: Return in about 8 weeks (around 12/30/2021) for follow up. Sooner for new or worsening symptoms.    [name], FNP

## 2021-11-05 LAB — CMP14+EGFR
ALT: 194 IU/L — ABNORMAL HIGH (ref 0–44)
AST: 172 IU/L — ABNORMAL HIGH (ref 0–40)
Albumin/Globulin Ratio: 1.3 (ref 1.2–2.2)
Albumin: 3 g/dL — ABNORMAL LOW (ref 3.6–4.6)
Alkaline Phosphatase: 1048 IU/L (ref 44–121)
BUN/Creatinine Ratio: 21 (ref 10–24)
BUN: 23 mg/dL (ref 8–27)
Bilirubin Total: 4.1 mg/dL — ABNORMAL HIGH (ref 0.0–1.2)
CO2: 23 mmol/L (ref 20–29)
Calcium: 7.8 mg/dL — ABNORMAL LOW (ref 8.6–10.2)
Chloride: 106 mmol/L (ref 96–106)
Creatinine, Ser: 1.08 mg/dL (ref 0.76–1.27)
Globulin, Total: 2.4 g/dL (ref 1.5–4.5)
Glucose: 209 mg/dL — ABNORMAL HIGH (ref 70–99)
Potassium: 3.8 mmol/L (ref 3.5–5.2)
Sodium: 142 mmol/L (ref 134–144)
Total Protein: 5.4 g/dL — ABNORMAL LOW (ref 6.0–8.5)
eGFR: 68 mL/min/{1.73_m2} (ref >59)

## 2021-11-05 LAB — CBC WITH DIFFERENTIAL/PLATELET
Basophils Absolute: 0.1 10*3/uL (ref 0.0–0.2)
Basos: 1 %
EOS (ABSOLUTE): 0.4 10*3/uL (ref 0.0–0.4)
Eos: 4 %
Hematocrit: 27.6 % — ABNORMAL LOW (ref 37.5–51.0)
Hemoglobin: 10 g/dL — ABNORMAL LOW (ref 13.0–17.7)
Immature Grans (Abs): 0.1 10*3/uL (ref 0.0–0.1)
Immature Granulocytes: 1 %
Lymphocytes Absolute: 2 10*3/uL (ref 0.7–3.1)
Lymphs: 18 %
MCH: 34.5 pg — ABNORMAL HIGH (ref 26.6–33.0)
MCHC: 36.2 g/dL — ABNORMAL HIGH (ref 31.5–35.7)
MCV: 95 fL (ref 79–97)
Monocytes Absolute: 0.8 10*3/uL (ref 0.1–0.9)
Monocytes: 7 %
Neutrophils Absolute: 7.7 10*3/uL — ABNORMAL HIGH (ref 1.4–7.0)
Neutrophils: 69 %
Platelets: 305 10*3/uL (ref 150–450)
RBC: 2.9 x10E6/uL — ABNORMAL LOW (ref 4.14–5.80)
RDW: 13.8 % (ref 11.6–15.4)
WBC: 11 10*3/uL — ABNORMAL HIGH (ref 3.4–10.8)

## 2021-11-05 LAB — HEPATIC FUNCTION PANEL: Bilirubin, Direct: 2.69 mg/dL — ABNORMAL HIGH (ref 0.00–0.40)

## 2021-11-10 ENCOUNTER — Telehealth: Payer: Self-pay | Admitting: *Deleted

## 2021-11-10 ENCOUNTER — Encounter: Payer: Self-pay | Admitting: *Deleted

## 2021-11-10 DIAGNOSIS — K831 Obstruction of bile duct: Secondary | ICD-10-CM

## 2021-11-10 NOTE — Telephone Encounter (Signed)
Call from Dr. Perley Jain office requesting Dr. Benay Spice recheck liver functions at visit on 11/11/21. Per Dr. Benay Spice: order CBC, CMP. Fax results to [phone]

## 2021-11-11 ENCOUNTER — Encounter: Payer: Self-pay | Admitting: *Deleted

## 2021-11-11 ENCOUNTER — Inpatient Hospital Stay: Payer: Medicare Other

## 2021-11-11 ENCOUNTER — Inpatient Hospital Stay: Payer: Medicare Other | Attending: Oncology | Admitting: Oncology

## 2021-11-11 ENCOUNTER — Other Ambulatory Visit: Payer: Self-pay

## 2021-11-11 ENCOUNTER — Telehealth: Payer: Self-pay | Admitting: Family Medicine

## 2021-11-11 ENCOUNTER — Ambulatory Visit (HOSPITAL_BASED_OUTPATIENT_CLINIC_OR_DEPARTMENT_OTHER)
Admission: RE | Admit: 2021-11-11 | Discharge: 2021-11-11 | Disposition: A | Discharge status: Home / Self Care | Payer: Medicare Other | Source: Ambulatory Visit | Attending: Oncology | Admitting: Oncology

## 2021-11-11 VITALS — BP 100/60 | HR 65 | Temp 98.0°F | Resp 18 | Ht 70.0 in | Wt 139.0 lb

## 2021-11-11 DIAGNOSIS — J90 Pleural effusion, not elsewhere classified: Secondary | ICD-10-CM | POA: Diagnosis not present

## 2021-11-11 DIAGNOSIS — I499 Cardiac arrhythmia, unspecified: Secondary | ICD-10-CM | POA: Diagnosis not present

## 2021-11-11 DIAGNOSIS — R0989 Other specified symptoms and signs involving the circulatory and respiratory systems: Secondary | ICD-10-CM | POA: Diagnosis not present

## 2021-11-11 DIAGNOSIS — J9811 Atelectasis: Secondary | ICD-10-CM | POA: Diagnosis not present

## 2021-11-11 DIAGNOSIS — N138 Other obstructive and reflux uropathy: Secondary | ICD-10-CM

## 2021-11-11 DIAGNOSIS — D539 Nutritional anemia, unspecified: Secondary | ICD-10-CM

## 2021-11-11 DIAGNOSIS — C240 Malignant neoplasm of extrahepatic bile duct: Secondary | ICD-10-CM

## 2021-11-11 DIAGNOSIS — K831 Obstruction of bile duct: Secondary | ICD-10-CM | POA: Insufficient documentation

## 2021-11-11 LAB — CBC WITH DIFFERENTIAL (CANCER CENTER ONLY)
Abs Immature Granulocytes: 0.06 10*3/uL (ref 0.00–0.07)
Basophils Absolute: 0.1 10*3/uL (ref 0.0–0.1)
Basophils Relative: 1 %
Eosinophils Absolute: 0.3 10*3/uL (ref 0.0–0.5)
Eosinophils Relative: 3 %
HCT: 35.2 % — ABNORMAL LOW (ref 39.0–52.0)
Hemoglobin: 11.4 g/dL — ABNORMAL LOW (ref 13.0–17.0)
Immature Granulocytes: 1 %
Lymphocytes Relative: 26 %
Lymphs Abs: 2.7 10*3/uL (ref 0.7–4.0)
MCH: 33.4 pg (ref 26.0–34.0)
MCHC: 32.4 g/dL (ref 30.0–36.0)
MCV: 103.2 fL — ABNORMAL HIGH (ref 80.0–100.0)
Monocytes Absolute: 1.2 10*3/uL — ABNORMAL HIGH (ref 0.1–1.0)
Monocytes Relative: 12 %
Neutro Abs: 6 10*3/uL (ref 1.7–7.7)
Neutrophils Relative %: 57 %
Platelet Count: 361 10*3/uL (ref 150–400)
RBC: 3.41 MIL/uL — ABNORMAL LOW (ref 4.22–5.81)
RDW: 14.6 % (ref 11.5–15.5)
WBC Count: 10.4 10*3/uL (ref 4.0–10.5)
nRBC: 0 % (ref 0.0–0.2)

## 2021-11-11 LAB — CMP (CANCER CENTER ONLY)
ALT: 85 U/L — ABNORMAL HIGH (ref 0–44)
AST: 79 U/L — ABNORMAL HIGH (ref 15–41)
Albumin: 2.8 g/dL — ABNORMAL LOW (ref 3.5–5.0)
Alkaline Phosphatase: 478 U/L — ABNORMAL HIGH (ref 38–126)
Anion gap: 11 (ref 5–15)
BUN: 22 mg/dL (ref 8–23)
CO2: 26 mmol/L (ref 22–32)
Calcium: 8.4 mg/dL — ABNORMAL LOW (ref 8.9–10.3)
Chloride: 100 mmol/L (ref 98–111)
Creatinine: 1.25 mg/dL — ABNORMAL HIGH (ref 0.61–1.24)
GFR, Estimated: 56 mL/min — ABNORMAL LOW (ref >60)
Glucose, Bld: 276 mg/dL — ABNORMAL HIGH (ref 70–99)
Potassium: 4 mmol/L (ref 3.5–5.1)
Sodium: 137 mmol/L (ref 135–145)
Total Bilirubin: 3.2 mg/dL — ABNORMAL HIGH (ref 0.3–1.2)
Total Protein: 5.9 g/dL — ABNORMAL LOW (ref 6.5–8.1)

## 2021-11-11 NOTE — Progress Notes (Signed)
[name] New Patient Consult   Requesting MD: Clarene Essex, [place] [address] [place],  Terry Hernandez [id number]   Terry Hernandez 87 y.o.  [birth year]    Reason for Consult: Adenocarcinoma involving a bile duct stricture brushing   HPI: Terry Hernandez was admitted 10/24/2021 with jaundice and tarry stools.  A CT of the abdomen and pelvis revealed mild intrahepatic and moderate extrahepatic biliary dilatation with tapering at the ampulla.  No mass identified.  The pancreas appeared normal.  No abdominopelvic lymphadenopathy.  The prostate is enlarged. He underwent an ERCP by Dr. Watt Climes on 10/26/2021.  Brushings were obtained from the lower third of the main bile duct.  A distal stricture was confirmed.  A plastic stent was placed.  An upper endoscopy on 10/26/2021 was unremarkable.  He was transfused with 1 unit of packed red cells on 10/26/2021 for severe anemia.  No source for bleeding was identified.  He had acute kidney injury secondary to obstructive uropathy.  This was treated with intravenous hydration and placement of a Foley catheter.  A Foley remains in place.   He had persistent jaundice following discharge from the hospital.  He was taken to a repeat ERCP procedure by Dr. Watt Climes on 10/30/2021.  The previously placed stent was removed.  The stricture was confirmed.  An uncovered metal stent was placed.  Mr. Geier reports improvement in jaundice following the most recent ERCP procedure.       Past Medical History:  Diagnosis Date   Arthritis    GERD (gastroesophageal reflux disease)    PMH   Glucose intolerance (impaired glucose tolerance)    High cholesterol    History of kidney stones    HTN (hypertension)    Hyperlipidemia    Lumbar stenosis with neurogenic claudication    Pneumonia    Subcutaneous mass    Wears glasses     .  Arrhythmia-atrial fibrillation?  Past Surgical History:  Procedure Laterality Date   APPENDECTOMY     BILIARY  BRUSHING  10/26/2021   Procedure: BILIARY BRUSHING;  Surgeon: Clarene Essex, MD;  Location: [facility] ENDOSCOPY;  Service: Endoscopy;;   BILIARY STENT PLACEMENT  10/26/2021   Procedure: BILIARY STENT PLACEMENT;  Surgeon: Clarene Essex, MD;  Location: [facility] ENDOSCOPY;  Service: Endoscopy;;   BILIARY STENT PLACEMENT  10/30/2021   Procedure: BILIARY STENT PLACEMENT;  Surgeon: Clarene Essex, MD;  Location: [place];  Service: Endoscopy;;   COLONOSCOPY W/ BIOPSIES AND POLYPECTOMY     ERCP N/A 10/26/2021   Procedure: ENDOSCOPIC RETROGRADE CHOLANGIOPANCREATOGRAPHY (ERCP);  Surgeon: Clarene Essex, MD;  Location: [place];  Service: Endoscopy;  Laterality: N/A;   ERCP N/A 10/30/2021   Procedure: ENDOSCOPIC RETROGRADE CHOLANGIOPANCREATOGRAPHY (ERCP);  Surgeon: Clarene Essex, MD;  Location: [place];  Service: Endoscopy;  Laterality: N/A;   ESOPHAGOGASTRODUODENOSCOPY N/A 10/26/2021   Procedure: ESOPHAGOGASTRODUODENOSCOPY (EGD);  Surgeon: Clarene Essex, MD;  Location: [place];  Service: Endoscopy;  Laterality: N/A;   LUMBAR LAMINECTOMY/DECOMPRESSION MICRODISCECTOMY Bilateral 03/18/2020   Procedure: Bilateral Lumbar three-four Lumbar four-five Laminotomy/foraminotomy;  Surgeon: Kristeen Miss, MD;  Location: [place];  Service: Neurosurgery;  Laterality: Bilateral;   MASS EXCISION Right 03/18/2020   Procedure: Excision of a Right gluteal subcutaneous mass;  Surgeon: Kristeen Miss, MD;  Location: [place];  Service: Neurosurgery;  Laterality: Right;   MULTIPLE TOOTH EXTRACTIONS     SPHINCTEROTOMY  10/26/2021   Procedure: SPHINCTEROTOMY;  Surgeon: Clarene Essex, MD;  Location: [place];  Service: Endoscopy;;  STENT REMOVAL  10/30/2021   Procedure: STENT REMOVAL;  Surgeon: Clarene Essex, MD;  Location: [facility] ENDOSCOPY;  Service: Endoscopy;;    Medications: Reviewed  Allergies:  Allergies  Allergen Reactions   Cephalexin Rash    REACTION: rash    Family history: His mother had a "abdominal cancer ".  A son recently  died of lung cancer and was a smoker.  Social History:   He lives with his wife and [place].  He is retired.  He previously worked in a Merchandiser, retail, for a [employer], and owned a [employer].  He does not use cigarettes or alcohol.  He was transfused with a unit of packed red blood cells in November 2022.  No risk factor for HIV or hepatitis.  ROS:   Positives include: Anorexia, leg weakness requiring ambulation with a walker  A complete ROS was otherwise negative.  Physical Exam:  Blood pressure 100/60, pulse 65, temperature 98 F (36.7 C), resp. rate 18, height 5\' 10"  (1.778 m), weight 139 lb (63 kg), SpO2 98 %.  HEENT: Mild scleral icterus, no thrush Lungs: Decreased breath sounds throughout the right chest, no respiratory distress Cardiac: Irregular, tachycardia Abdomen: No hepatosplenomegaly, no mass, nontender  Vascular: No leg edema Lymph nodes: No cervical, supraclavicular, axillary, or inguinal nodes Neurologic: Alert and oriented, the motor exam appears intact in the upper and lower extremities bilaterally, 4+/5 strength with dorsi flexion at the feet, required 2 person assist to ambulate to the exam table Skin: Jaundice GU: Foley catheter in place Musculoskeletal: Bone deformity at the left shoulder   LAB:  CBC  Lab Results  Component Value Date   WBC 10.4 11/11/2021   HGB 11.4 (L) 11/11/2021   HCT 35.2 (L) 11/11/2021   MCV 103.2 (H) 11/11/2021   PLT 361 11/11/2021   NEUTROABS 6.0 11/11/2021        CMP  Lab Results  Component Value Date   NA 142 11/04/2021   K 3.8 11/04/2021   CL 106 11/04/2021   CO2 23 11/04/2021   GLUCOSE 209 (H) 11/04/2021   BUN 23 11/04/2021   CREATININE 1.08 11/04/2021   CALCIUM 7.8 (L) 11/04/2021   PROT 5.4 (L) 11/04/2021   ALBUMIN 3.0 (L) 11/04/2021   AST 172 (H) 11/04/2021   ALT 194 (H) 11/04/2021   ALKPHOS 1,048 ([place]) 11/04/2021   BILITOT 4.1 (H) 11/04/2021   GFRNONAA 33 (L) 10/29/2021   GFRAA >60 03/18/2020     Imaging: CT abdomen/pelvis 10/23/2021-images reviewed with Terry Hernandez and his son    Assessment/Plan:   Bile duct stricture-cytology from brushing 10/26/2021-malignant cells consistent with adenocarcinoma ERCP 10/26/2021-distal common duct stricture confirmed, plastic stent placed Repeat ERCP 10/30/2021-placement of uncovered metal stent CT abdomen/pelvis 10/23/2021-intra and extrahepatic biliary dilatation, tapering at the ampulla, no mass Severe macrocytic anemia 10/25/2021-no source for bleeding identified on upper endoscopy 10/26/2021 1 unit packed red blood cells 10/26/2021 3.   Obstructive nephropathy-placement of Foley catheter 10/27/2021 Followed by urology for BPH  4.  Obstructive jaundice secondary to #1-improved 5.  Spinal stenosis-status post lumbar laminectomy April 2021 6.  Arrhythmia-atrial fibrillation?   Disposition:   Mr. Baltzell has been diagnosed with adenocarcinoma on brushing of a bile duct stricture.  No primary mass was identified at the time of ERCP or on an admission CT abdomen/pelvis.  The differential diagnosis includes pancreas cancer and cholangiocarcinoma.  There is no clinical or radiologic evidence of distant metastatic disease.  I discussed the differential diagnosis with Mr. Voorhies and  his son.  He understands the only potentially curative therapy is surgery.  I doubt he will be a surgical candidate.  The jaundice has been palliated with placement of a bile duct stent.  We discussed other palliative treatment options including chemotherapy and radiation.  He indicated he may not agree to further treatment.  He will be scheduled for an MRI abdomen to look for evidence of a pancreas or bile duct mass.  He will return for an office visit and further discussion on 11/25/2021.  The etiology of the severe anemia on presentation 10/24/2021 is unclear.  This will require further evaluation if he develops recurrent severe anemia.  He is scheduled to  see Dr.Wrenn for follow-up of the obstructive uropathy.  Betsy Coder, MD  11/11/2021, 1:54 PM

## 2021-11-11 NOTE — Progress Notes (Signed)
PATIENT NAVIGATOR PROGRESS NOTE  Name: Terry Hernandez Date: 11/11/2021 MRN: [id number]  DOB: May 04, 1934   Reason for visit:  Initial visit with Dr Benay Spice  Comments:  Met with son [name] and Mr Seubert during initial med/onc visit. He will have further work up with MR of Abdomen and CXR for evaluation of origin of cancer. He voiced desire for undergoing more testing but possibly not having any chemo/radiation treatments. He stated that he is at peace. Son [name] is very supportive and lives next door and provides care and makes sure Mr Nuzum and his wife's needs are met. We discussed needs in the home such as home health aide or home PT evaluation.  Referrals made today are for home health eval, cardiology and order for MR entered.  He will RTC in two weeks for discussion of findings and plan of care Lab results from today faxed to Dr Perley Jain office per his request   Time spent counseling/coordinating care: > 60 minutes

## 2021-11-12 ENCOUNTER — Encounter: Payer: Self-pay | Admitting: *Deleted

## 2021-11-12 NOTE — Progress Notes (Signed)
PATIENT NAVIGATOR PROGRESS NOTE  Name: DAEGON DEISS Date: 11/12/2021 MRN: [id number]  DOB: [birth year]   Reason for visit:  Phone call with son for appt and review of CXR  Comments:  Spoke with son [name] and informed him of upcoming appt for MR of abdomen at [facility] on 12/12 at 1pm with arrival at 1145. We discussed CXR findings per Dr Benay Spice, small amount of fluid right lower lobe, encourage activity.  [name] discussed his father's lack of sleep, per Dr Benay Spice he can take Diphenhydramine or Tylenol PM for sleep. [name] verbalized understanding.     Time spent counseling/coordinating care: 15-30 minutes

## 2021-11-12 NOTE — Progress Notes (Signed)
Faxed 11/11/21 CBC/CMP results to Dr. Watt Climes at [phone].

## 2021-11-13 ENCOUNTER — Encounter: Payer: Self-pay | Admitting: *Deleted

## 2021-11-13 NOTE — Progress Notes (Signed)
Sent referral order for cardiology to [place] location [phone]. Dr. Benay Spice hopeful he can be seen same day as he is at [facility] .

## 2021-11-14 ENCOUNTER — Inpatient Hospital Stay (HOSPITAL_COMMUNITY)
Admission: EM | Admit: 2021-11-14 | Discharge: 2021-11-18 | DRG: 698 | Disposition: A | Discharge status: Home Health | Payer: Medicare Other | Attending: Family Medicine | Admitting: Family Medicine

## 2021-11-14 ENCOUNTER — Emergency Department (HOSPITAL_COMMUNITY): Payer: Medicare Other

## 2021-11-14 ENCOUNTER — Telehealth: Payer: Self-pay | Admitting: *Deleted

## 2021-11-14 ENCOUNTER — Encounter (HOSPITAL_COMMUNITY): Payer: Self-pay | Admitting: *Deleted

## 2021-11-14 DIAGNOSIS — Z809 Family history of malignant neoplasm, unspecified: Secondary | ICD-10-CM

## 2021-11-14 DIAGNOSIS — Z87442 Personal history of urinary calculi: Secondary | ICD-10-CM

## 2021-11-14 DIAGNOSIS — N139 Obstructive and reflux uropathy, unspecified: Secondary | ICD-10-CM

## 2021-11-14 DIAGNOSIS — T83518A Infection and inflammatory reaction due to other urinary catheter, initial encounter: Principal | ICD-10-CM | POA: Diagnosis present

## 2021-11-14 DIAGNOSIS — J449 Chronic obstructive pulmonary disease, unspecified: Secondary | ICD-10-CM | POA: Diagnosis present

## 2021-11-14 DIAGNOSIS — I130 Hypertensive heart and chronic kidney disease with heart failure and stage 1 through stage 4 chronic kidney disease, or unspecified chronic kidney disease: Secondary | ICD-10-CM | POA: Diagnosis not present

## 2021-11-14 DIAGNOSIS — R531 Weakness: Secondary | ICD-10-CM

## 2021-11-14 DIAGNOSIS — R319 Hematuria, unspecified: Secondary | ICD-10-CM | POA: Diagnosis present

## 2021-11-14 DIAGNOSIS — E782 Mixed hyperlipidemia: Secondary | ICD-10-CM | POA: Diagnosis present

## 2021-11-14 DIAGNOSIS — C259 Malignant neoplasm of pancreas, unspecified: Secondary | ICD-10-CM

## 2021-11-14 DIAGNOSIS — R5381 Other malaise: Secondary | ICD-10-CM

## 2021-11-14 DIAGNOSIS — R81 Glycosuria: Secondary | ICD-10-CM | POA: Diagnosis present

## 2021-11-14 DIAGNOSIS — Z823 Family history of stroke: Secondary | ICD-10-CM

## 2021-11-14 DIAGNOSIS — I480 Paroxysmal atrial fibrillation: Secondary | ICD-10-CM | POA: Diagnosis present

## 2021-11-14 DIAGNOSIS — J9601 Acute respiratory failure with hypoxia: Secondary | ICD-10-CM | POA: Diagnosis not present

## 2021-11-14 DIAGNOSIS — J90 Pleural effusion, not elsewhere classified: Secondary | ICD-10-CM | POA: Diagnosis not present

## 2021-11-14 DIAGNOSIS — I482 Chronic atrial fibrillation, unspecified: Secondary | ICD-10-CM

## 2021-11-14 DIAGNOSIS — C221 Intrahepatic bile duct carcinoma: Secondary | ICD-10-CM | POA: Diagnosis present

## 2021-11-14 DIAGNOSIS — I10 Essential (primary) hypertension: Secondary | ICD-10-CM | POA: Diagnosis present

## 2021-11-14 DIAGNOSIS — E440 Moderate protein-calorie malnutrition: Secondary | ICD-10-CM | POA: Diagnosis not present

## 2021-11-14 DIAGNOSIS — Y846 Urinary catheterization as the cause of abnormal reaction of the patient, or of later complication, without mention of misadventure at the time of the procedure: Secondary | ICD-10-CM | POA: Diagnosis present

## 2021-11-14 DIAGNOSIS — N1831 Chronic kidney disease, stage 3a: Secondary | ICD-10-CM | POA: Diagnosis present

## 2021-11-14 DIAGNOSIS — Z888 Allergy status to other drugs, medicaments and biological substances status: Secondary | ICD-10-CM

## 2021-11-14 DIAGNOSIS — N401 Enlarged prostate with lower urinary tract symptoms: Secondary | ICD-10-CM | POA: Diagnosis present

## 2021-11-14 DIAGNOSIS — Z20822 Contact with and (suspected) exposure to covid-19: Secondary | ICD-10-CM | POA: Diagnosis not present

## 2021-11-14 DIAGNOSIS — B9620 Unspecified Escherichia coli [E. coli] as the cause of diseases classified elsewhere: Secondary | ICD-10-CM | POA: Diagnosis present

## 2021-11-14 DIAGNOSIS — M48062 Spinal stenosis, lumbar region with neurogenic claudication: Secondary | ICD-10-CM | POA: Diagnosis present

## 2021-11-14 DIAGNOSIS — Z79899 Other long term (current) drug therapy: Secondary | ICD-10-CM

## 2021-11-14 DIAGNOSIS — K831 Obstruction of bile duct: Secondary | ICD-10-CM | POA: Diagnosis not present

## 2021-11-14 DIAGNOSIS — R7401 Elevation of levels of liver transaminase levels: Secondary | ICD-10-CM

## 2021-11-14 DIAGNOSIS — C249 Malignant neoplasm of biliary tract, unspecified: Secondary | ICD-10-CM

## 2021-11-14 DIAGNOSIS — E8809 Other disorders of plasma-protein metabolism, not elsewhere classified: Secondary | ICD-10-CM | POA: Diagnosis present

## 2021-11-14 DIAGNOSIS — D539 Nutritional anemia, unspecified: Secondary | ICD-10-CM | POA: Diagnosis present

## 2021-11-14 DIAGNOSIS — Z8719 Personal history of other diseases of the digestive system: Secondary | ICD-10-CM

## 2021-11-14 DIAGNOSIS — I5031 Acute diastolic (congestive) heart failure: Secondary | ICD-10-CM | POA: Diagnosis not present

## 2021-11-14 DIAGNOSIS — E872 Acidosis, unspecified: Secondary | ICD-10-CM | POA: Diagnosis present

## 2021-11-14 DIAGNOSIS — N138 Other obstructive and reflux uropathy: Secondary | ICD-10-CM | POA: Diagnosis present

## 2021-11-14 DIAGNOSIS — N390 Urinary tract infection, site not specified: Secondary | ICD-10-CM | POA: Diagnosis not present

## 2021-11-14 DIAGNOSIS — I4891 Unspecified atrial fibrillation: Secondary | ICD-10-CM | POA: Diagnosis present

## 2021-11-14 DIAGNOSIS — K219 Gastro-esophageal reflux disease without esophagitis: Secondary | ICD-10-CM | POA: Diagnosis not present

## 2021-11-14 DIAGNOSIS — M1990 Unspecified osteoarthritis, unspecified site: Secondary | ICD-10-CM | POA: Diagnosis present

## 2021-11-14 DIAGNOSIS — R627 Adult failure to thrive: Secondary | ICD-10-CM | POA: Diagnosis present

## 2021-11-14 DIAGNOSIS — E11649 Type 2 diabetes mellitus with hypoglycemia without coma: Secondary | ICD-10-CM | POA: Diagnosis present

## 2021-11-14 DIAGNOSIS — N179 Acute kidney failure, unspecified: Secondary | ICD-10-CM | POA: Diagnosis not present

## 2021-11-14 DIAGNOSIS — Z66 Do not resuscitate: Secondary | ICD-10-CM | POA: Diagnosis present

## 2021-11-14 DIAGNOSIS — Z6820 Body mass index (BMI) 20.0-20.9, adult: Secondary | ICD-10-CM

## 2021-11-14 DIAGNOSIS — R0902 Hypoxemia: Secondary | ICD-10-CM

## 2021-11-14 DIAGNOSIS — R739 Hyperglycemia, unspecified: Secondary | ICD-10-CM | POA: Diagnosis not present

## 2021-11-14 DIAGNOSIS — G8929 Other chronic pain: Secondary | ICD-10-CM | POA: Diagnosis present

## 2021-11-14 DIAGNOSIS — E46 Unspecified protein-calorie malnutrition: Secondary | ICD-10-CM

## 2021-11-14 DIAGNOSIS — E1122 Type 2 diabetes mellitus with diabetic chronic kidney disease: Secondary | ICD-10-CM | POA: Diagnosis not present

## 2021-11-14 DIAGNOSIS — E860 Dehydration: Secondary | ICD-10-CM | POA: Diagnosis present

## 2021-11-14 DIAGNOSIS — E1165 Type 2 diabetes mellitus with hyperglycemia: Secondary | ICD-10-CM | POA: Diagnosis not present

## 2021-11-14 DIAGNOSIS — D649 Anemia, unspecified: Secondary | ICD-10-CM

## 2021-11-14 LAB — BASIC METABOLIC PANEL
Anion gap: 11 (ref 5–15)
BUN: 29 mg/dL — ABNORMAL HIGH (ref 8–23)
CO2: 25 mmol/L (ref 22–32)
Calcium: 8.2 mg/dL — ABNORMAL LOW (ref 8.9–10.3)
Chloride: 100 mmol/L (ref 98–111)
Creatinine, Ser: 1.51 mg/dL — ABNORMAL HIGH (ref 0.61–1.24)
GFR, Estimated: 45 mL/min — ABNORMAL LOW (ref >60)
Glucose, Bld: 306 mg/dL — ABNORMAL HIGH (ref 70–99)
Potassium: 4.1 mmol/L (ref 3.5–5.1)
Sodium: 136 mmol/L (ref 135–145)

## 2021-11-14 LAB — CBC
HCT: 37.6 % — ABNORMAL LOW (ref 39.0–52.0)
Hemoglobin: 12.1 g/dL — ABNORMAL LOW (ref 13.0–17.0)
MCH: 34.6 pg — ABNORMAL HIGH (ref 26.0–34.0)
MCHC: 32.2 g/dL (ref 30.0–36.0)
MCV: 107.4 fL — ABNORMAL HIGH (ref 80.0–100.0)
Platelets: 400 10*3/uL (ref 150–400)
RBC: 3.5 MIL/uL — ABNORMAL LOW (ref 4.22–5.81)
RDW: 14.3 % (ref 11.5–15.5)
WBC: 9.1 10*3/uL (ref 4.0–10.5)
nRBC: 0 % (ref 0.0–0.2)

## 2021-11-14 LAB — LACTIC ACID, PLASMA
Lactic Acid, Venous: 4.1 mmol/L (ref 0.5–1.9)
Lactic Acid, Venous: 3.3 mmol/L (ref 0.5–1.9)

## 2021-11-14 LAB — DIFFERENTIAL
Abs Immature Granulocytes: 0.06 10*3/uL (ref 0.00–0.07)
Basophils Absolute: 0.1 10*3/uL (ref 0.0–0.1)
Basophils Relative: 1 %
Eosinophils Absolute: 0.3 10*3/uL (ref 0.0–0.5)
Eosinophils Relative: 3 %
Immature Granulocytes: 1 %
Lymphocytes Relative: 31 %
Lymphs Abs: 3.1 10*3/uL (ref 0.7–4.0)
Monocytes Absolute: 1.1 10*3/uL — ABNORMAL HIGH (ref 0.1–1.0)
Monocytes Relative: 11 %
Neutro Abs: 5.4 10*3/uL (ref 1.7–7.7)
Neutrophils Relative %: 53 %

## 2021-11-14 LAB — HEPATIC FUNCTION PANEL
ALT: 65 U/L — ABNORMAL HIGH (ref 0–44)
AST: 61 U/L — ABNORMAL HIGH (ref 15–41)
Albumin: 2.2 g/dL — ABNORMAL LOW (ref 3.5–5.0)
Alkaline Phosphatase: 405 U/L — ABNORMAL HIGH (ref 38–126)
Bilirubin, Direct: 1.2 mg/dL — ABNORMAL HIGH (ref 0.0–0.2)
Indirect Bilirubin: 1.4 mg/dL — ABNORMAL HIGH (ref 0.3–0.9)
Total Bilirubin: 2.6 mg/dL — ABNORMAL HIGH (ref 0.3–1.2)
Total Protein: 6.3 g/dL — ABNORMAL LOW (ref 6.5–8.1)

## 2021-11-14 LAB — RESP PANEL BY RT-PCR (FLU A&B, COVID) ARPGX2
Influenza A by PCR: NEGATIVE
Influenza B by PCR: NEGATIVE
SARS Coronavirus 2 by RT PCR: NEGATIVE

## 2021-11-14 LAB — LIPASE, BLOOD: Lipase: 57 U/L — ABNORMAL HIGH (ref 11–51)

## 2021-11-14 MED ORDER — SODIUM CHLORIDE 0.9 % IV BOLUS
1000.0000 mL | Freq: Once | INTRAVENOUS | Status: AC
  Administered 2021-11-14: 1000 mL via INTRAVENOUS

## 2021-11-14 MED ORDER — IOHEXOL 350 MG/ML SOLN
100.0000 mL | Freq: Once | INTRAVENOUS | Status: AC | PRN
  Administered 2021-11-15: 100 mL via INTRAVENOUS

## 2021-11-14 NOTE — Telephone Encounter (Signed)
Referral placed.

## 2021-11-14 NOTE — H&P (Signed)
History and Physical  Terry Hernandez AOZ:[id number] DOB: Nov 20, 1934 DOA: 11/14/2021  Referring physician: Daleen Bo, MD  PCP: [name], FNP  Patient coming from: Home   Chief Complaint: Generalized weakness  HPI: Terry Hernandez is a 87 y.o. male with medical history significant for paroxysmal A. fib followed by [facility] cardiology, pancreatitis, CKD stage 3, GERD, hypertension, osteoarthritis, hyperlipidemia, prediabetes, chronic back pain, left foot drop, lumbar stenosis with neurogenic claudication who presents to the emergency department accompanied by son due to worsening generalized weakness over the last few days, patient has lost about 9 pounds within last week per son at bedside.  Patient has not been able to get out of bed within last 48 hours due to weakness, he lives with elderly wife who was incapable of being able to assist in ambulation.  Diet has decreased recently and he took a whole lot of effort to be able to assist in getting out of bed today.  Patient was noted to have very little urine production today, so he presented to the ED for further evaluation and management accompanied by son. He was recently admitted from 11/11-11/16 due to distal common bile duct stricture with transaminitis during which ERCP was done and stent was placed on 10/26/2021 and 10-day course of antibiotic therapy with Augmentin on discharge was provided. He denies chest pain, shortness of breath, fever, chills, nausea, vomiting, abdominal pain, diarrhea or constipation  ED Course:  In the emergency department, he was hemodynamically stable.  Work-up in the ED showed elevated MCV, BUN slight creatinine 29/1.51 (baseline creatinine at 1.8-2.4), lactic acid 4.1 > 3.3, transaminitis, total bilirubin 2.6 hypoalbuminemia, lipase 57, urinalysis was positive for glycosuria, hematuria and positive nitrite.  Influenza A, B, SARS coronavirus 2 was negative. Chest x-ray showed no acute cardiopulmonary  abnormality CT angiography of chest with contrast showed no pulmonary embolus or acute aortic syndrome but showed small right pleural effusion and mild pulmonary edema. IV hydration was provided.  Hospitalist was asked to admit patient for further evaluation and management.  Review of Systems: Constitutional: Negative for chills and fever.  HENT: Negative for ear pain and sore throat.   Eyes: Negative for pain and visual disturbance.  Respiratory: Negative for cough, chest tightness and shortness of breath.   Cardiovascular: Negative for chest pain and palpitations.  Gastrointestinal: Negative for abdominal pain and vomiting.  Endocrine: Negative for polyphagia and polyuria.  Genitourinary: Positive for decreased urine volume, negative for enuresis Musculoskeletal: Negative for arthralgias and back pain.  Skin: Negative for color change and rash.  Allergic/Immunologic: Negative for immunocompromised state.  Neurological: Negative for tremors, syncope, speech difficulty, weakness, light-headedness and headaches.  Hematological: Does not bruise/bleed easily.  All other systems reviewed and are negative   Past Medical History:  Diagnosis Date   Arthritis    GERD (gastroesophageal reflux disease)    PMH   Glucose intolerance (impaired glucose tolerance)    High cholesterol    History of kidney stones    HTN (hypertension)    Hyperlipidemia    Lumbar stenosis with neurogenic claudication    Pneumonia    Subcutaneous mass    Wears glasses    Past Surgical History:  Procedure Laterality Date   APPENDECTOMY     BILIARY BRUSHING  10/26/2021   Procedure: BILIARY BRUSHING;  Surgeon: Clarene Essex, MD;  Location: [facility] ENDOSCOPY;  Service: Endoscopy;;   BILIARY STENT PLACEMENT  10/26/2021   Procedure: BILIARY STENT PLACEMENT;  Surgeon: Clarene Essex, MD;  Location: [place] ENDOSCOPY;  Service: Endoscopy;;   BILIARY STENT PLACEMENT  10/30/2021   Procedure: BILIARY STENT PLACEMENT;  Surgeon: Clarene Essex, MD;  Location: [place];  Service: Endoscopy;;   COLONOSCOPY W/ BIOPSIES AND POLYPECTOMY     ERCP N/A 10/26/2021   Procedure: ENDOSCOPIC RETROGRADE CHOLANGIOPANCREATOGRAPHY (ERCP);  Surgeon: Clarene Essex, MD;  Location: [place];  Service: Endoscopy;  Laterality: N/A;   ERCP N/A 10/30/2021   Procedure: ENDOSCOPIC RETROGRADE CHOLANGIOPANCREATOGRAPHY (ERCP);  Surgeon: Clarene Essex, MD;  Location: [place];  Service: Endoscopy;  Laterality: N/A;   ESOPHAGOGASTRODUODENOSCOPY N/A 10/26/2021   Procedure: ESOPHAGOGASTRODUODENOSCOPY (EGD);  Surgeon: Clarene Essex, MD;  Location: [place];  Service: Endoscopy;  Laterality: N/A;   LUMBAR LAMINECTOMY/DECOMPRESSION MICRODISCECTOMY Bilateral 03/18/2020   Procedure: Bilateral Lumbar three-four Lumbar four-five Laminotomy/foraminotomy;  Surgeon: Kristeen Miss, MD;  Location: [place];  Service: Neurosurgery;  Laterality: Bilateral;   MASS EXCISION Right 03/18/2020   Procedure: Excision of a Right gluteal subcutaneous mass;  Surgeon: Kristeen Miss, MD;  Location: [place];  Service: Neurosurgery;  Laterality: Right;   MULTIPLE TOOTH EXTRACTIONS     SPHINCTEROTOMY  10/26/2021   Procedure: SPHINCTEROTOMY;  Surgeon: Clarene Essex, MD;  Location: [facility] ENDOSCOPY;  Service: Endoscopy;;   STENT REMOVAL  10/30/2021   Procedure: STENT REMOVAL;  Surgeon: Clarene Essex, MD;  Location: [place];  Service: Endoscopy;;    Social History:  reports that he has never smoked. He has never used smokeless tobacco. He reports that he does not drink alcohol and does not use drugs.   Allergies  Allergen Reactions   Cephalexin Rash    REACTION: rash    Family History  Problem Relation Age of Onset   Cancer Mother    Stroke Father    Cancer Son        lung     Prior to Admission medications   Medication Sig Start Date End Date Taking? Authorizing Provider  Ascorbic Acid (VITAMIN C PO) Take 1 tablet by mouth daily.   Yes [provider]  B Complex  Vitamins (VITAMIN B COMPLEX PO) Take 1 tablet by mouth daily.   Yes [provider]  methocarbamol (ROBAXIN) 500 MG tablet Take 1 tablet (500 mg total) by mouth every 6 (six) hours as needed for muscle spasms. 03/19/20  Yes Kristeen Miss, MD  metoprolol succinate (TOPROL-XL) 25 MG 24 hr tablet Take 0.5 tablets (12.5 mg total) by mouth daily. 10/29/21  Yes Aline August, MD  Multiple Vitamin (MULTIVITAMIN) tablet Take 1 tablet by mouth at bedtime.    Yes [provider]  pantoprazole (PROTONIX) 40 MG tablet Take 1 tablet (40 mg total) by mouth daily. 10/29/21 11/28/21 Yes Aline August, MD  silodosin (RAPAFLO) 8 MG CAPS capsule Take 1 capsule (8 mg total) by mouth daily with breakfast. 05/15/21  Yes Irine Seal, MD    Physical Exam: BP 117/77   Pulse 97   Temp 98.2 F (36.8 C) (Oral)   Resp 19   Ht 5\' 10"  (1.778 m)   Wt 64.5 kg   SpO2 99%   BMI 20.40 kg/m   General: 87 y.o. year-old male well developed well nourished in no acute distress.  Alert and oriented x3. HEENT: NCAT, EOMI Neck: Supple, trachea medial Cardiovascular: Irregular rate and rhythm with no rubs or gallops.  No thyromegaly or JVD noted.  No lower extremity edema. 2/4 pulses in all 4 extremities. Respiratory: Rales noted in right lower lobe.  No wheezing Abdomen: Soft, nontender nondistended with  normal bowel sounds x4 quadrants. Muskuloskeletal: No cyanosis, clubbing or edema noted bilaterally Neuro: CN II-XII intact, strength 5/5 x 4, sensation, reflexes intact Skin: No ulcerative lesions noted or rashes Psychiatry: Judgement and insight appear normal. Mood is appropriate for condition and setting          Labs on Admission:  Basic Metabolic Panel: Recent Labs  Lab 11/11/21 1336 11/14/21 1800  NA 137 136  K 4.0 4.1  CL 100 100  CO2 26 25  GLUCOSE 276* 306*  BUN 22 29*  CREATININE 1.25* 1.51*  CALCIUM 8.4* 8.2*   Liver Function Tests: Recent Labs  Lab 11/11/21 1336 11/14/21 1800   AST 79* 61*  ALT 85* 65*  ALKPHOS 478* 405*  BILITOT 3.2* 2.6*  PROT 5.9* 6.3*  ALBUMIN 2.8* 2.2*   Recent Labs  Lab 11/14/21 1800  LIPASE 57*   No results for input(s): AMMONIA in the last 168 hours. CBC: Recent Labs  Lab 11/11/21 1336 11/14/21 1800  WBC 10.4 9.1  NEUTROABS 6.0 5.4  HGB 11.4* 12.1*  HCT 35.2* 37.6*  MCV 103.2* 107.4*  PLT 361 400   Cardiac Enzymes: No results for input(s): CKTOTAL, CKMB, CKMBINDEX, TROPONINI in the last 168 hours.  BNP (last 3 results) No results for input(s): BNP in the last 8760 hours.  ProBNP (last 3 results) No results for input(s): PROBNP in the last 8760 hours.  CBG: No results for input(s): GLUCAP in the last 168 hours.  Radiological Exams on Admission: DG Chest 2 View  Result Date: 11/14/2021 CLINICAL DATA:  Weak EXAM: CHEST - 2 VIEW COMPARISON:  Chest x-ray 11/11/2021 FINDINGS: The heart and mediastinal contours are unchanged. Aortic calcification. Persistent coarsened increased interstitial markings. No pulmonary edema. Persistent blunting of the costophrenic angles with trace pleural effusions not excluded. No pneumothorax. No acute osseous abnormality. IMPRESSION: 1. No acute cardiopulmonary abnormality. 2. Persistent coarsened increased interstitial markings. 3. Persistent blunting of the costophrenic angles with trace pleural effusions not excluded. Electronically Signed   By: Iven Finn M.D.   On: 11/14/2021 21:06   CT Angio Chest PE W/Cm &/Or Wo Cm  Result Date: 11/15/2021 CLINICAL DATA:  Hypoxia EXAM: CT ANGIOGRAPHY CHEST WITH CONTRAST TECHNIQUE: Multidetector CT imaging of the chest was performed using the standard protocol during bolus administration of intravenous contrast. Multiplanar CT image reconstructions and MIPs were obtained to evaluate the vascular anatomy. CONTRAST:  176mL OMNIPAQUE IOHEXOL 350 MG/ML SOLN COMPARISON:  None. FINDINGS: Cardiovascular: Contrast injection is sufficient to demonstrate  satisfactory opacification of the pulmonary arteries to the segmental level. There is no pulmonary embolus or evidence of right heart strain. The size of the main pulmonary artery is normal. Heart size is normal, with no pericardial effusion. The course and caliber of the aorta are normal. There is atherosclerotic calcification. No acute aortic syndrome. Mediastinum/Nodes: No mediastinal, hilar or axillary lymphadenopathy. Normal visualized thyroid. Thoracic esophageal course is normal. Lungs/Pleura: Small right pleural effusion. Interstitial thickening within the right upper lobe. Bibasilar atelectasis. Upper Abdomen: Contrast bolus timing is not optimized for evaluation of the abdominal organs. Pneumobilia. There is a stent in the common bile duct, incompletely visualized. Musculoskeletal: No chest wall abnormality. No bony spinal canal stenosis. Review of the MIP images confirms the above findings. IMPRESSION: 1. No pulmonary embolus or acute aortic syndrome. 2. Small right pleural effusion and mild pulmonary edema. Aortic Atherosclerosis (ICD10-I70.0). Electronically Signed   By: Ulyses Jarred M.D.   On: 11/15/2021 00:25    EKG: I  independently viewed the EKG done and my findings are as followed: A. fib with RVR  Assessment/Plan Present on Admission:  Hyperglycemia  Atrial fibrillation (HCC)  GERD (gastroesophageal reflux disease)  Principal Problem:   Generalized weakness Active Problems:   Mixed hyperlipidemia   Essential hypertension   GERD (gastroesophageal reflux disease)   Macrocytic anemia   Hyperglycemia   Atrial fibrillation (HCC)   Acute lower UTI   Pleural effusion   Physical deconditioning   Transaminitis   Obstructive uropathy   Lactic acidosis   Failure to thrive in adult   Hypoalbuminemia due to protein-calorie malnutrition (HCC)   History of pancreatitis  Generalized weakness possibly secondary to deconditioning and failure to thrive in adult Hypoalbuminemia  secondary to moderate protein calorie malnutrition in the setting of above This may all be due to patient's recent diagnosis of abdominal neoplasm (?  Cholangiocarcinoma) which resulted in weight loss. Protein supplement to be provided Continue fall precaution and neurochecks Continue PT/OT eval and treat  Questionable CAUTI  Patient has a Foley catheter, urinalysis was positive for hematuria, glycosuria and nitrite, patient denies any irritative bladder symptoms.  We shall await urine culture prior to starting antibiotics.  Pleural effusion Chest x-ray showed showed small right pleural effusion and mild pulmonary edema. Patient denies fever, chills, cough, chest congestion or any other respiratory symptoms There was no noted peripheral edema or increased abdominal girth, on the contrary, patient's son endorsed about 9 pound weight loss within last week. Continue total input/output, daily weights and fluid restriction Consider IV Lasix when BP improves (currently soft) Continue Cardiac diet  EKG shows A. fib with RVR Echocardiogram will be done in the morning   Hyperglycemia secondary to type 2 diabetes mellitus CBG 306, hemoglobin A1c on 10/24/2021 was 65 Continue ISS and hypoglycemia  Elevated lipase due to history of pancreatitis Lipase 57, stable  Macrocytic anemia MCV 107.4, folate level vitamin B12 will be checked  Transaminitis in the setting of cancer of the common bile duct (?  Cholangiocarcinoma) AST 61, ALT 65, ALP 405, total bilirubin 2.6 Continue to monitor liver enzymes  A. fib with RVR Stable, Toprol XL will be held at this time due to soft BP  GERD Continue on Protonix  Essential hypertension Antihypertensive medication will be held at this time due to soft BP  Mixed hyperlipidemia Patient's antihyperlipidemic medication was already been stopped due to elevated liver enzymes   DVT prophylaxis: Lovenox  Code Status: Full code  Family Communication: Son  at bedside (all questions answered to satisfaction)  Disposition Plan:  Patient is from:                        home Anticipated DC to:                   SNF or family members home Anticipated DC date:               2-3 days Anticipated DC barriers:         Patient requires inpatient management due to generalized weakness secondary to deconditioning requiring PT/OT eval and recommendation   Consults called: None  Admission status: Observation   Bernadette Hoit MD [facility]  11/15/2021, 4:21 AM

## 2021-11-14 NOTE — ED Provider Notes (Signed)
Emergency Medicine Provider Triage Evaluation Note  Terry Hernandez , a 87 y.o. male  was evaluated in triage.  Pt here for evaluation of generalized weakness, decreased urine output and questionable urinary tract infection.  Patient seen here on 10/24/2021 and evaluated for dark stools and jaundice he was admitted to the hospital and underwent upper endoscopy and later had ERCP stent was placed.  He had persistent jaundice and he was taken for repeat ERCP on 10/30/2021.  Stricture was confirmed and initial stent was replaced.  There was some concern for malignancy and he had follow-up visit with oncology.  He is awaiting MRI of the abdomen.  Here with son who provides most history.  Patient denies any pain at this time.  Son is concerned that he may have a urinary tract infection as his Foley catheter has been in place since his initial hospitalist visit on 10/24/21.  Son states he is only had a 300 cc urine output today and appetite has been diminished.  No vomiting, fever or chills.  Patient denies any pain at present.  Review of Systems  Positive: Generalized weakness, decreased appetite, cloudy urine Negative: Fever, chills, vomiting, and abdominal pain  Physical Exam  BP 94/61 (BP Location: Right Arm)   Pulse (!) 113   Temp 97.7 F (36.5 C) (Oral)   Resp 16   SpO2 96%  Gen:   Awake, no distress  Resp:  Normal effort  MSK:   Moves extremities without difficulty, no peripheral edema Other:  Foley catheter in place, 300 cc cloudy urine present  Medical Decision Making  Medically screening exam initiated at 6:05 PM.  Appropriate orders placed.  [name] was informed that the remainder of the evaluation will be completed by another provider, this initial triage assessment does not replace that evaluation, and the importance of remaining in the ED until their evaluation is complete.  Patient here for decreased urinary output and generalized weakness.  Recently evaluated for jaundice found  to have a dilated bile duct, underwent ERCP with questionable functionality of stent.  Here for further evaluation of generalized weakness and son concerned about UTI.  He will need further evaluation emergency department patient agreeable to plan.   Kem Parkinson, PA-C 11/14/21 1825    Daleen Bo, MD 11/15/21 [phone]

## 2021-11-14 NOTE — ED Provider Notes (Addendum)
[facility] EMERGENCY DEPARTMENT Provider Note   CSN: [id number] Arrival date & time: 11/14/21  1716     History Chief Complaint  Patient presents with   Altered Mental Status    Terry Hernandez is a 87 y.o. male. Son is primary historian.  He is at the bedside. HPI He is here for evaluation of generalized weakness, worsening over the last several days, associated with difficulty walking.  He saw his oncologist earlier this week and had a chest x-ray done which showed "some fluid."  He is being managed for a new right upper quadrant malignancy, which required biliary stenting.  He has had a Foley catheter placed since his stenting procedure.  That is due to be removed on 11/27/2021, by urology.  Patient's son is unsure why he had to have the Foley placed.  Patient has not had a fever, cough, nausea or vomiting.  There are no other known active modifying factors.  Past Medical History:  Diagnosis Date   Arthritis    GERD (gastroesophageal reflux disease)    PMH   Glucose intolerance (impaired glucose tolerance)    High cholesterol    History of kidney stones    HTN (hypertension)    Hyperlipidemia    Lumbar stenosis with neurogenic claudication    Pneumonia    Subcutaneous mass    Wears glasses     Patient Active Problem List   Diagnosis Date Noted   Atrial fibrillation ([place]) 11/04/2021   Hyperbilirubinemia 10/24/2021   Common bile duct (CBD) stricture 10/24/2021   HTN (hypertension)    GERD (gastroesophageal reflux disease)    Guaiac positive stools    AKI (acute kidney injury) ([place])    Leukocytosis    Elevated liver enzymes    Anemia, unspecified    Hyperglycemia    Hyponatremia    Hypotension    Abnormal weight loss    Jaundice    Lumbar stenosis with neurogenic claudication 03/18/2020   Lumbar spinal stenosis 03/18/2020   PREMATURE ATRIAL CONTRACTIONS 09/04/2009   PALPITATIONS 09/04/2009   Hyperlipidemia 09/03/2009    Past Surgical History:  Procedure  Laterality Date   APPENDECTOMY     BILIARY BRUSHING  10/26/2021   Procedure: BILIARY BRUSHING;  Surgeon: Clarene Essex, MD;  Location: [facility] ENDOSCOPY;  Service: Endoscopy;;   BILIARY STENT PLACEMENT  10/26/2021   Procedure: BILIARY STENT PLACEMENT;  Surgeon: Clarene Essex, MD;  Location: [place];  Service: Endoscopy;;   BILIARY STENT PLACEMENT  10/30/2021   Procedure: BILIARY STENT PLACEMENT;  Surgeon: Clarene Essex, MD;  Location: [place];  Service: Endoscopy;;   COLONOSCOPY W/ BIOPSIES AND POLYPECTOMY     ERCP N/A 10/26/2021   Procedure: ENDOSCOPIC RETROGRADE CHOLANGIOPANCREATOGRAPHY (ERCP);  Surgeon: Clarene Essex, MD;  Location: [place];  Service: Endoscopy;  Laterality: N/A;   ERCP N/A 10/30/2021   Procedure: ENDOSCOPIC RETROGRADE CHOLANGIOPANCREATOGRAPHY (ERCP);  Surgeon: Clarene Essex, MD;  Location: [place];  Service: Endoscopy;  Laterality: N/A;   ESOPHAGOGASTRODUODENOSCOPY N/A 10/26/2021   Procedure: ESOPHAGOGASTRODUODENOSCOPY (EGD);  Surgeon: Clarene Essex, MD;  Location: [place];  Service: Endoscopy;  Laterality: N/A;   LUMBAR LAMINECTOMY/DECOMPRESSION MICRODISCECTOMY Bilateral 03/18/2020   Procedure: Bilateral Lumbar three-four Lumbar four-five Laminotomy/foraminotomy;  Surgeon: Kristeen Miss, MD;  Location: [place];  Service: Neurosurgery;  Laterality: Bilateral;   MASS EXCISION Right 03/18/2020   Procedure: Excision of a Right gluteal subcutaneous mass;  Surgeon: Kristeen Miss, MD;  Location: [place];  Service: Neurosurgery;  Laterality: Right;   MULTIPLE TOOTH EXTRACTIONS  SPHINCTEROTOMY  10/26/2021   Procedure: SPHINCTEROTOMY;  Surgeon: Clarene Essex, MD;  Location[facility] ENDOSCOPY;  Service: Endoscopy;;   STENT REMOVAL  10/30/2021   Procedure: STENT REMOVAL;  Surgeon: Clarene Essex, MD;  Location: [facility] ENDOSCOPY;  Service: Endoscopy;;       Family History  Problem Relation Age of Onset   Cancer Mother    Stroke Father    Cancer Son        lung    Social History    Tobacco Use   Smoking status: Never   Smokeless tobacco: Never  Vaping Use   Vaping Use: Never used  Substance Use Topics   Alcohol use: Never   Drug use: Never    Home Medications Prior to Admission medications   Medication Sig Start Date End Date Taking? Authorizing Provider  Ascorbic Acid (VITAMIN C PO) Take 1 tablet by mouth daily.   Yes [provider]  B Complex Vitamins (VITAMIN B COMPLEX PO) Take 1 tablet by mouth daily.   Yes [provider]  methocarbamol (ROBAXIN) 500 MG tablet Take 1 tablet (500 mg total) by mouth every 6 (six) hours as needed for muscle spasms. 03/19/20  Yes Kristeen Miss, MD  metoprolol succinate (TOPROL-XL) 25 MG 24 hr tablet Take 0.5 tablets (12.5 mg total) by mouth daily. 10/29/21  Yes Aline August, MD  Multiple Vitamin (MULTIVITAMIN) tablet Take 1 tablet by mouth at bedtime.    Yes [provider]  pantoprazole (PROTONIX) 40 MG tablet Take 1 tablet (40 mg total) by mouth daily. 10/29/21 11/28/21 Yes Aline August, MD  silodosin (RAPAFLO) 8 MG CAPS capsule Take 1 capsule (8 mg total) by mouth daily with breakfast. 05/15/21  Yes Irine Seal, MD    Allergies    Cephalexin  Review of Systems   Review of Systems  All other systems reviewed and are negative.  Physical Exam Updated Vital Signs BP 95/68   Pulse (!) 115   Temp 98.7 F (37.1 C) (Rectal)   Resp (!) 29   SpO2 99%   Physical Exam Vitals and nursing note reviewed.  Constitutional:      General: He is not in acute distress.    Appearance: He is well-developed. He is not ill-appearing or toxic-appearing.     Comments: Elderly, frail  HENT:     Head: Normocephalic and atraumatic.     Right Ear: External ear normal.     Left Ear: External ear normal.     Nose: No congestion.  Eyes:     Conjunctiva/sclera: Conjunctivae normal.     Pupils: Pupils are equal, round, and reactive to light.  Neck:     Trachea: Phonation normal.  Cardiovascular:     Rate  and Rhythm: Tachycardia present.  Pulmonary:     Effort: Pulmonary effort is normal.  Abdominal:     General: There is no distension.     Palpations: Abdomen is soft.     Tenderness: There is no abdominal tenderness.  Musculoskeletal:        General: No swelling or tenderness. Normal range of motion.     Cervical back: Normal range of motion and neck supple.     Right lower leg: No edema.     Left lower leg: No edema.  Skin:    General: Skin is warm and dry.  Neurological:     Mental Status: He is alert. Mental status is at baseline.     Cranial Nerves: No cranial nerve deficit.  Sensory: No sensory deficit.     Motor: No abnormal muscle tone.     Coordination: Coordination normal.  Psychiatric:        Mood and Affect: Mood normal.        Behavior: Behavior normal.    ED Results / Procedures / Treatments   Labs (all labs ordered are listed, but only abnormal results are displayed) Labs Reviewed  BASIC METABOLIC PANEL - Abnormal; Notable for the following components:      Result Value   Glucose, Bld 306 (*)    BUN 29 (*)    Creatinine, Ser 1.51 (*)    Calcium 8.2 (*)    GFR, Estimated 45 (*)    All other components within normal limits  CBC - Abnormal; Notable for the following components:   RBC 3.50 (*)    Hemoglobin 12.1 (*)    HCT 37.6 (*)    MCV 107.4 (*)    MCH 34.6 (*)    All other components within normal limits  LACTIC ACID, PLASMA - Abnormal; Notable for the following components:   Lactic Acid, Venous 4.1 (*)    All other components within normal limits  LACTIC ACID, PLASMA - Abnormal; Notable for the following components:   Lactic Acid, Venous 3.3 (*)    All other components within normal limits  HEPATIC FUNCTION PANEL - Abnormal; Notable for the following components:   Total Protein 6.3 (*)    Albumin 2.2 (*)    AST 61 (*)    ALT 65 (*)    Alkaline Phosphatase 405 (*)    Total Bilirubin 2.6 (*)    Bilirubin, Direct 1.2 (*)    Indirect Bilirubin  1.4 (*)    All other components within normal limits  LIPASE, BLOOD - Abnormal; Notable for the following components:   Lipase 57 (*)    All other components within normal limits  DIFFERENTIAL - Abnormal; Notable for the following components:   Monocytes Absolute 1.1 (*)    All other components within normal limits  RESP PANEL BY RT-PCR (FLU A&B, COVID) [id number]  URINALYSIS, ROUTINE W REFLEX MICROSCOPIC    EKG EKG Interpretation  Date/Time:  Friday November 14 2021 19:55:32 EST Ventricular Rate:  106 PR Interval:  114 QRS Duration: 75 QT Interval:  345 QTC Calculation: 459 R Axis:   20 Text Interpretation: Sinus tachycardia with irregular rate since last tracing no significant change Reconfirmed by Daleen Bo [phone]) on 11/14/2021 8:43:34 PM  Radiology DG Chest 2 View  Result Date: 11/14/2021 CLINICAL DATA:  Weak EXAM: CHEST - 2 VIEW COMPARISON:  Chest x-ray 11/11/2021 FINDINGS: The heart and mediastinal contours are unchanged. Aortic calcification. Persistent coarsened increased interstitial markings. No pulmonary edema. Persistent blunting of the costophrenic angles with trace pleural effusions not excluded. No pneumothorax. No acute osseous abnormality. IMPRESSION: 1. No acute cardiopulmonary abnormality. 2. Persistent coarsened increased interstitial markings. 3. Persistent blunting of the costophrenic angles with trace pleural effusions not excluded. Electronically Signed   By: Iven Finn M.D.   On: 11/14/2021 21:06    Procedures Procedures   Medications Ordered in ED Medications  sodium chloride 0.9 % bolus 1,000 mL (0 mLs Intravenous Stopped 11/14/21 2038)    ED Course  I have reviewed the triage vital signs and the nursing notes.  Pertinent labs & imaging results that were available during my care of the patient were reviewed by me and considered in my medical decision making (see chart for details).  Clinical Course  as of 11/14/21 2309  Fri Nov 14, 2021  2218  Patient has developed hypoxia, 88% on room air, requiring nasal cannula oxygen.  Ambulation trial, he was unable to ambulate without two-person assistance and oxygen saturation dropped to 85% while ambulating. [EW]    Clinical Course User Index [EW] Daleen Bo, MD   MDM Rules/Calculators/A&P                            Patient Vitals for the past 24 hrs:  BP Temp Temp src Pulse Resp SpO2  11/14/21 2200 95/68 -- -- (!) 115 (!) 29 99 %  11/14/21 2130 99/67 -- -- (!) 116 (!) 27 100 %  11/14/21 2030 110/68 -- -- (!) 122 (!) 29 (!) 89 %  11/14/21 1953 -- 98.7 F (37.1 C) Rectal -- -- --  11/14/21 1900 96/70 -- -- (!) 108 18 96 %  11/14/21 1741 94/61 97.7 F (36.5 C) Oral (!) 113 16 96 %    11:07 PM Reevaluation with update and discussion. After initial assessment and treatment, an updated evaluation reveals he was unable to walk without assistance and decompensated with low oxygen level.  Findings discussed with patient and son.  They are agreeable to hospitalization.Daleen Bo   Medical Decision Making:  This patient is presenting for evaluation of weakness and trouble, which does require a range of treatment options, and is a complaint that involves a high risk of morbidity and mortality. The differential diagnoses include complications from cancer, acute infection, depleted volume status, malaise. I decided to review old records, and in summary elderly male, recently treated for malignant neoplasm requiring biliary stenting.  He presents for gradually worsening symptoms of weakness I obtain additional historical information from son at bedside.  Clinical Laboratory Tests Ordered, included CBC, Metabolic panel, and lipase, lactate, . Review indicates normal except initial lactate high, improved after fluid treatment, lipase high, total protein low, albumin low, AST high, ALT high, alk phos test high, total bilirubin high, direct bilirubin high, indirect bilirubin high, hemoglobin  low, MCV high, glucose high, BUN high, creatinine high, calcium low, GFR low. Radiologic Tests Ordered, included chest x-ray.  I independently Visualized: Radiograph images, which show no infiltrate or edema.  Small bilateral pleural effusions.  No significant acute respiratory disorders.  No cardiac enlargement.  Cardiac Monitor Tracing which shows sinus tachycardia    Critical Interventions-clinical evaluation, laboratory testing, radiography, IV fluids, observation and reassess  After These Interventions, the Patient was reevaluated and was found with complicated illness.  Had recent biliary stenting due to malignancy.  He had obstructive uropathy requiring placement of Foley catheter.  He received a blood transfusion about 3 weeks ago.  On recent imaging he had intra and extrahepatic biliary dilatation.  Plans are in the works for outpatient MRI to assess his right upper quadrant process.  He has an office visit with urology on 11/25/2021 to follow-up on that.  Today his parameters on laboratory testing are improved.  His weakness is nonspecific and likely related to his oncologic disorder.  CT chest, angiogram ordered to evaluate for possible PE.  Hospitalist consulted to admit for management.  CRITICAL CARE-yes Performed by: Daleen Bo  Nursing Notes Reviewed/ Care Coordinated Applicable Imaging Reviewed Interpretation of Laboratory Data incorporated into ED treatment   11:09 PM-Consult complete with hospitalist. Patient case explained and discussed.  He agrees to admit patient for further evaluation and treatment, after CTA results. Call ended  at 11:42 PM  Plan-Dr. Christy Gentles to call hospitalist with CT results, then hospitalist will admit the patient.  Final Clinical Impression(s) / ED Diagnoses Final diagnoses:  Weakness  Hypoxia  Biliary tract cancer ([place])  Anemia, unspecified type    Rx / DC Orders ED Discharge Orders     None        Daleen Bo, MD 11/14/21  7998    Daleen Bo, MD 11/19/21 1136

## 2021-11-14 NOTE — Telephone Encounter (Signed)
TC from [place] w/ [facility] Wanting to know if we can place an order in Epic for [place] Please sign pending order

## 2021-11-14 NOTE — ED Triage Notes (Signed)
Decreased urinary output, recently has urinary stent placed

## 2021-11-14 NOTE — ED Notes (Signed)
Attempted to walk pt on room air , sats dropped to 86% on room air , pt very weak 2 person assist , pt placed back in bed and placed on 2 liters n/c

## 2021-11-14 NOTE — ED Notes (Signed)
Pt placed on 2 liters O2 n/c due to sats of 89% on room air

## 2021-11-15 ENCOUNTER — Other Ambulatory Visit (HOSPITAL_COMMUNITY): Payer: Self-pay | Admitting: *Deleted

## 2021-11-15 ENCOUNTER — Observation Stay (HOSPITAL_COMMUNITY): Payer: Medicare Other

## 2021-11-15 ENCOUNTER — Other Ambulatory Visit: Payer: Self-pay

## 2021-11-15 ENCOUNTER — Encounter (HOSPITAL_COMMUNITY): Payer: Self-pay | Admitting: Internal Medicine

## 2021-11-15 DIAGNOSIS — N390 Urinary tract infection, site not specified: Secondary | ICD-10-CM | POA: Diagnosis not present

## 2021-11-15 DIAGNOSIS — Z66 Do not resuscitate: Secondary | ICD-10-CM | POA: Diagnosis present

## 2021-11-15 DIAGNOSIS — J9601 Acute respiratory failure with hypoxia: Secondary | ICD-10-CM | POA: Diagnosis not present

## 2021-11-15 DIAGNOSIS — E1122 Type 2 diabetes mellitus with diabetic chronic kidney disease: Secondary | ICD-10-CM | POA: Diagnosis present

## 2021-11-15 DIAGNOSIS — N139 Obstructive and reflux uropathy, unspecified: Secondary | ICD-10-CM

## 2021-11-15 DIAGNOSIS — E1165 Type 2 diabetes mellitus with hyperglycemia: Secondary | ICD-10-CM | POA: Diagnosis present

## 2021-11-15 DIAGNOSIS — I130 Hypertensive heart and chronic kidney disease with heart failure and stage 1 through stage 4 chronic kidney disease, or unspecified chronic kidney disease: Secondary | ICD-10-CM | POA: Diagnosis present

## 2021-11-15 DIAGNOSIS — C221 Intrahepatic bile duct carcinoma: Secondary | ICD-10-CM | POA: Diagnosis present

## 2021-11-15 DIAGNOSIS — B9620 Unspecified Escherichia coli [E. coli] as the cause of diseases classified elsewhere: Secondary | ICD-10-CM | POA: Diagnosis present

## 2021-11-15 DIAGNOSIS — J90 Pleural effusion, not elsewhere classified: Secondary | ICD-10-CM | POA: Diagnosis not present

## 2021-11-15 DIAGNOSIS — I5031 Acute diastolic (congestive) heart failure: Secondary | ICD-10-CM | POA: Diagnosis not present

## 2021-11-15 DIAGNOSIS — E8809 Other disorders of plasma-protein metabolism, not elsewhere classified: Secondary | ICD-10-CM | POA: Diagnosis present

## 2021-11-15 DIAGNOSIS — K831 Obstruction of bile duct: Secondary | ICD-10-CM | POA: Diagnosis present

## 2021-11-15 DIAGNOSIS — R0902 Hypoxemia: Secondary | ICD-10-CM | POA: Diagnosis not present

## 2021-11-15 DIAGNOSIS — R531 Weakness: Secondary | ICD-10-CM | POA: Diagnosis not present

## 2021-11-15 DIAGNOSIS — N1831 Chronic kidney disease, stage 3a: Secondary | ICD-10-CM | POA: Diagnosis present

## 2021-11-15 DIAGNOSIS — Y846 Urinary catheterization as the cause of abnormal reaction of the patient, or of later complication, without mention of misadventure at the time of the procedure: Secondary | ICD-10-CM | POA: Diagnosis present

## 2021-11-15 DIAGNOSIS — D539 Nutritional anemia, unspecified: Secondary | ICD-10-CM | POA: Diagnosis present

## 2021-11-15 DIAGNOSIS — E872 Acidosis, unspecified: Secondary | ICD-10-CM

## 2021-11-15 DIAGNOSIS — R7401 Elevation of levels of liver transaminase levels: Secondary | ICD-10-CM

## 2021-11-15 DIAGNOSIS — E440 Moderate protein-calorie malnutrition: Secondary | ICD-10-CM | POA: Diagnosis present

## 2021-11-15 DIAGNOSIS — R5381 Other malaise: Secondary | ICD-10-CM

## 2021-11-15 DIAGNOSIS — C259 Malignant neoplasm of pancreas, unspecified: Secondary | ICD-10-CM | POA: Diagnosis present

## 2021-11-15 DIAGNOSIS — Z8719 Personal history of other diseases of the digestive system: Secondary | ICD-10-CM

## 2021-11-15 DIAGNOSIS — E11649 Type 2 diabetes mellitus with hypoglycemia without coma: Secondary | ICD-10-CM | POA: Diagnosis present

## 2021-11-15 DIAGNOSIS — J449 Chronic obstructive pulmonary disease, unspecified: Secondary | ICD-10-CM | POA: Diagnosis present

## 2021-11-15 DIAGNOSIS — I480 Paroxysmal atrial fibrillation: Secondary | ICD-10-CM | POA: Diagnosis present

## 2021-11-15 DIAGNOSIS — J9811 Atelectasis: Secondary | ICD-10-CM | POA: Diagnosis not present

## 2021-11-15 DIAGNOSIS — R627 Adult failure to thrive: Secondary | ICD-10-CM | POA: Diagnosis present

## 2021-11-15 DIAGNOSIS — N179 Acute kidney failure, unspecified: Secondary | ICD-10-CM | POA: Diagnosis present

## 2021-11-15 DIAGNOSIS — N138 Other obstructive and reflux uropathy: Secondary | ICD-10-CM | POA: Diagnosis present

## 2021-11-15 DIAGNOSIS — Z20822 Contact with and (suspected) exposure to covid-19: Secondary | ICD-10-CM | POA: Diagnosis present

## 2021-11-15 DIAGNOSIS — E46 Unspecified protein-calorie malnutrition: Secondary | ICD-10-CM | POA: Diagnosis present

## 2021-11-15 DIAGNOSIS — J811 Chronic pulmonary edema: Secondary | ICD-10-CM | POA: Diagnosis not present

## 2021-11-15 DIAGNOSIS — T83518A Infection and inflammatory reaction due to other urinary catheter, initial encounter: Secondary | ICD-10-CM | POA: Diagnosis present

## 2021-11-15 LAB — PHOSPHORUS: Phosphorus: 2.9 mg/dL (ref 2.5–4.6)

## 2021-11-15 LAB — COMPREHENSIVE METABOLIC PANEL
ALT: 52 U/L — ABNORMAL HIGH (ref 0–44)
AST: 49 U/L — ABNORMAL HIGH (ref 15–41)
Albumin: 1.9 g/dL — ABNORMAL LOW (ref 3.5–5.0)
Alkaline Phosphatase: 325 U/L — ABNORMAL HIGH (ref 38–126)
Anion gap: 6 (ref 5–15)
BUN: 28 mg/dL — ABNORMAL HIGH (ref 8–23)
CO2: 26 mmol/L (ref 22–32)
Calcium: 7.8 mg/dL — ABNORMAL LOW (ref 8.9–10.3)
Chloride: 107 mmol/L (ref 98–111)
Creatinine, Ser: 1.35 mg/dL — ABNORMAL HIGH (ref 0.61–1.24)
GFR, Estimated: 51 mL/min — ABNORMAL LOW (ref >60)
Glucose, Bld: 233 mg/dL — ABNORMAL HIGH (ref 70–99)
Potassium: 4.1 mmol/L (ref 3.5–5.1)
Sodium: 139 mmol/L (ref 135–145)
Total Bilirubin: 2.1 mg/dL — ABNORMAL HIGH (ref 0.3–1.2)
Total Protein: 5 g/dL — ABNORMAL LOW (ref 6.5–8.1)

## 2021-11-15 LAB — GLUCOSE, CAPILLARY
Glucose-Capillary: 216 mg/dL — ABNORMAL HIGH (ref 70–99)
Glucose-Capillary: 224 mg/dL — ABNORMAL HIGH (ref 70–99)
Glucose-Capillary: 280 mg/dL — ABNORMAL HIGH (ref 70–99)
Glucose-Capillary: 280 mg/dL — ABNORMAL HIGH (ref 70–99)

## 2021-11-15 LAB — VITAMIN B12: Vitamin B-12: 1031 pg/mL — ABNORMAL HIGH (ref 180–914)

## 2021-11-15 LAB — URINALYSIS, ROUTINE W REFLEX MICROSCOPIC
Bilirubin Urine: NEGATIVE
Glucose, UA: >=500 mg/dL — AB
Ketones, ur: NEGATIVE mg/dL
Leukocytes,Ua: NEGATIVE
Nitrite: POSITIVE — AB
Protein, ur: 30 mg/dL — AB
Specific Gravity, Urine: >1.03 — ABNORMAL HIGH (ref 1.005–1.030)
pH: 5 (ref 5.0–8.0)

## 2021-11-15 LAB — ECHOCARDIOGRAM COMPLETE
Area-P 1/2: 3.95 cm2
Height: 70 in
S' Lateral: 2.4 cm
Weight: 2275.15 oz

## 2021-11-15 LAB — CBC
HCT: 32.8 % — ABNORMAL LOW (ref 39.0–52.0)
Hemoglobin: 10.2 g/dL — ABNORMAL LOW (ref 13.0–17.0)
MCH: 33.8 pg (ref 26.0–34.0)
MCHC: 31.1 g/dL (ref 30.0–36.0)
MCV: 108.6 fL — ABNORMAL HIGH (ref 80.0–100.0)
Platelets: 273 10*3/uL (ref 150–400)
RBC: 3.02 MIL/uL — ABNORMAL LOW (ref 4.22–5.81)
RDW: 14.1 % (ref 11.5–15.5)
WBC: 8.3 10*3/uL (ref 4.0–10.5)
nRBC: 0 % (ref 0.0–0.2)

## 2021-11-15 LAB — URINALYSIS, MICROSCOPIC (REFLEX)

## 2021-11-15 LAB — APTT: aPTT: 32 seconds (ref 24–36)

## 2021-11-15 LAB — MAGNESIUM: Magnesium: 1.7 mg/dL (ref 1.7–2.4)

## 2021-11-15 LAB — FOLATE: Folate: 21.1 ng/mL (ref >5.9)

## 2021-11-15 MED ORDER — ENOXAPARIN SODIUM 40 MG/0.4ML IJ SOSY
40.0000 mg | PREFILLED_SYRINGE | INTRAMUSCULAR | Status: DC
  Administered 2021-11-15 – 2021-11-18 (×4): 40 mg via SUBCUTANEOUS
  Filled 2021-11-15 (×4): qty 0.4

## 2021-11-15 MED ORDER — SODIUM CHLORIDE 0.9 % IV SOLN
1.0000 g | Freq: Once | INTRAVENOUS | Status: AC
  Administered 2021-11-15: 1 g via INTRAVENOUS
  Filled 2021-11-15: qty 10

## 2021-11-15 MED ORDER — SODIUM CHLORIDE 0.9 % IV SOLN
1.0000 g | INTRAVENOUS | Status: DC
  Administered 2021-11-16 – 2021-11-17 (×2): 1 g via INTRAVENOUS
  Filled 2021-11-15 (×2): qty 10

## 2021-11-15 MED ORDER — INSULIN ASPART 100 UNIT/ML IJ SOLN
0.0000 [IU] | Freq: Three times a day (TID) | INTRAMUSCULAR | Status: DC
  Administered 2021-11-15: 3 [IU] via SUBCUTANEOUS
  Administered 2021-11-15: 5 [IU] via SUBCUTANEOUS
  Administered 2021-11-15: 3 [IU] via SUBCUTANEOUS
  Administered 2021-11-16: 10:00:00 2 [IU] via SUBCUTANEOUS
  Administered 2021-11-16 – 2021-11-17 (×3): 3 [IU] via SUBCUTANEOUS
  Administered 2021-11-17: 2 [IU] via SUBCUTANEOUS
  Administered 2021-11-18: 3 [IU] via SUBCUTANEOUS
  Administered 2021-11-18: 2 [IU] via SUBCUTANEOUS

## 2021-11-15 MED ORDER — METOPROLOL TARTRATE 25 MG PO TABS
25.0000 mg | ORAL_TABLET | Freq: Once | ORAL | Status: AC
  Administered 2021-11-15: 25 mg via ORAL
  Filled 2021-11-15: qty 1

## 2021-11-15 MED ORDER — CHLORHEXIDINE GLUCONATE CLOTH 2 % EX PADS
6.0000 | MEDICATED_PAD | Freq: Every day | CUTANEOUS | Status: DC
  Administered 2021-11-15 – 2021-11-18 (×4): 6 via TOPICAL

## 2021-11-15 MED ORDER — SODIUM CHLORIDE 0.9 % IV SOLN: INTRAVENOUS | Status: AC

## 2021-11-15 MED ORDER — GLUCERNA SHAKE PO LIQD
237.0000 mL | Freq: Three times a day (TID) | ORAL | Status: DC
  Administered 2021-11-15 – 2021-11-17 (×8): 237 mL via ORAL

## 2021-11-15 MED ORDER — METOPROLOL SUCCINATE ER 25 MG PO TB24
25.0000 mg | ORAL_TABLET | Freq: Every day | ORAL | Status: DC
  Administered 2021-11-16 – 2021-11-18 (×3): 25 mg via ORAL
  Filled 2021-11-15 (×3): qty 1

## 2021-11-15 MED ORDER — INSULIN ASPART 100 UNIT/ML IJ SOLN
0.0000 [IU] | Freq: Every day | INTRAMUSCULAR | Status: DC
  Administered 2021-11-15: 3 [IU] via SUBCUTANEOUS

## 2021-11-15 MED ORDER — SODIUM CHLORIDE 0.9 % IV SOLN
1.0000 g | Freq: Three times a day (TID) | INTRAVENOUS | Status: DC
  Administered 2021-11-15: 1 g via INTRAVENOUS
  Filled 2021-11-15 (×7): qty 1

## 2021-11-15 MED ORDER — PANTOPRAZOLE SODIUM 40 MG PO TBEC
40.0000 mg | DELAYED_RELEASE_TABLET | Freq: Every day | ORAL | Status: DC
  Administered 2021-11-15 – 2021-11-18 (×4): 40 mg via ORAL
  Filled 2021-11-15 (×4): qty 1

## 2021-11-15 MED ORDER — MIRTAZAPINE 15 MG PO TBDP
15.0000 mg | ORAL_TABLET | Freq: Every day | ORAL | Status: DC
  Administered 2021-11-15: 15 mg via ORAL
  Filled 2021-11-15 (×3): qty 1

## 2021-11-15 NOTE — Progress Notes (Signed)
Patient received via stretcher accompanied by son, alert and oriented with O2 @ 2lpm and no signs of distress. Patient able to answer questions appropriately. Skin check performed with second nurse, noted blanchable redness proximal to coccyx, no skin openings noted om B/L upper or lower extremities nor torso. Patient oriented to staff, and room call bell. IV site intact with NSS @ 26ml infusing. Bed lowered to floor, patient resting quietly in bed.

## 2021-11-15 NOTE — Evaluation (Signed)
Physical Therapy Evaluation Patient Details Name: Terry Hernandez MRN: [id number] DOB: [birth year] Today's Date: 11/15/2021  History of Present Illness  Terry Hernandez is a 87 y.o. male with medical history significant for paroxysmal A. fib followed by [facility] cardiology, pancreatitis, CKD stage 3, GERD, hypertension, osteoarthritis, hyperlipidemia, prediabetes, chronic back pain, left foot drop, lumbar stenosis with neurogenic claudication who presents to the emergency department accompanied by son due to worsening generalized weakness over the last few days, patient has lost about 9 pounds within last week per son at bedside.  Patient has not been able to get out of bed within last 48 hours due to weakness, he lives with elderly wife who was incapable of being able to assist in ambulation.  Diet has decreased recently and he took a whole lot of effort to be able to assist in getting out of bed today.  Patient was noted to have very little urine production today, so he presented to the ED for further evaluation and management accompanied by son.  He was recently admitted from 11/11-11/16 due to distal common bile duct stricture with transaminitis during which ERCP was done and stent was placed on 10/26/2021 and 10-day course of antibiotic therapy with Augmentin on discharge was provided.  He denies chest pain, shortness of breath, fever, chills, nausea, vomiting, abdominal pain, diarrhea or constipation  Clinical Impression  Present in room with son and agreeable to PT eval. Complete session with 2L supplemental O2. Able to complete bed mobility with supervision, bed flat, and use of rails, with very slow and labored movement. Demo increased difficutly with sit to stand without assistance, require MinA-ModA from PT to complete, but able to balance with CGA throughout with use of RW. Demo good gait mobility, but slow and labored with decreased step length, and required multiple rest breaks as returning to room.  Slight difficulty backing up tochair, but able to complete stand pivot transfer to chair with MinA and verbal cues to reach back to chair. RN notified of mobility status and patient left in chair with son and RN in room. Patient will benefit from continued skilled physical therapy in hospital and recommended venue below to increase strength, balance, endurance for safe ADLs and gait.      Recommendations for follow up therapy are one component of a multi-disciplinary discharge planning process, led by the attending physician.  Recommendations may be updated based on patient status, additional functional criteria and insurance authorization.  Follow Up Recommendations Skilled nursing-short term rehab (<3 hours/day)    Assistance Recommended at Discharge Frequent or constant Supervision/Assistance  Functional Status Assessment Patient has had a recent decline in their functional status and demonstrates the ability to make significant improvements in function in a reasonable and predictable amount of time.  Equipment Recommendations  None recommended by PT    Recommendations for Other Services       Precautions / Restrictions Precautions Precautions: Fall Restrictions Weight Bearing Restrictions: No      Mobility  Bed Mobility Overal bed mobility: Modified Independent;Needs Assistance Bed Mobility: Sidelying to Sit   Sidelying to sit: Supervision;Modified independent (Device/Increase time)       General bed mobility comments: slow and labored, use of rails, has bed with rails at home. Patient Response: Cooperative  Transfers Overall transfer level: Needs assistance Equipment used: Rolling walker (2 wheels) Transfers: Sit to/from Stand Sit to Stand: Min assist;Mod assist           General transfer comment: MinA-ModA, trial with  supervision and unable to raise to stand independently.    Ambulation/Gait Ambulation/Gait assistance: Min guard Gait Distance (Feet): 20  Feet Assistive device: Rolling walker (2 wheels) Gait Pattern/deviations: Step-through pattern;Decreased step length - right;Decreased step length - left;Decreased stride length Gait velocity: decreased     General Gait Details: NBOS, slow and labored, x3 rest breaks 30 sec -1 min.  Stairs            Wheelchair Mobility    Modified Rankin (Stroke Patients Only)       Balance Overall balance assessment: Mild deficits observed, not formally tested Sitting-balance support: Feet supported;Single extremity supported Sitting balance-Leahy Scale: Good     Standing balance support: Bilateral upper extremity supported;During functional activity;Reliant on assistive device for balance Standing balance-Leahy Scale: Poor                               Pertinent Vitals/Pain Pain Assessment: No/denies pain    Home Living Family/patient expects to be discharged to:: Private residence Living Arrangements: Spouse/significant other Available Help at Discharge: Family;Available PRN/intermittently Type of Home: House Home Access: Stairs to enter Entrance Stairs-Rails: Right Entrance Stairs-Number of Steps: 2   Home Layout: One level Home Equipment: Conservation officer, nature (2 wheels);Rollator (4 wheels);BSC/3in1;Crutches;Shower seat      Prior Function Prior Level of Function : Needs assist             Mobility Comments: uses crutches/RW at baseline ADLs Comments: assist in IADLs, driving, groceries, etc.     Hand Dominance   Dominant Hand: Right    Extremity/Trunk Assessment   Upper Extremity Assessment Upper Extremity Assessment: Generalized weakness    Lower Extremity Assessment Lower Extremity Assessment: Generalized weakness    Cervical / Trunk Assessment Cervical / Trunk Assessment: Normal  Communication   Communication: No difficulties  Cognition Arousal/Alertness: Awake/alert Behavior During Therapy: WFL for tasks assessed/performed Overall  Cognitive Status: Within Functional Limits for tasks assessed                                          General Comments      Exercises     Assessment/Plan    PT Assessment Patient needs continued PT services  PT Problem List Decreased strength;Decreased activity tolerance;Decreased balance;Decreased mobility;Impaired sensation       PT Treatment Interventions DME instruction;Gait training;Functional mobility training;Therapeutic activities;Therapeutic exercise;Balance training;Patient/family education;Neuromuscular re-education    PT Goals (Current goals can be found in the Care Plan section)  Acute Rehab PT Goals Patient Stated Goal: increase strength and be safe in mobility PT Goal Formulation: With patient Time For Goal Achievement: 11/29/21 Potential to Achieve Goals: Good    Frequency Min 3X/week   Barriers to discharge        Co-evaluation               AM-PAC PT "6 Clicks" Mobility  Outcome Measure Help needed turning from your back to your side while in a flat bed without using bedrails?: A Little Help needed moving from lying on your back to sitting on the side of a flat bed without using bedrails?: A Little Help needed moving to and from a bed to a chair (including a wheelchair)?: A Lot Help needed standing up from a chair using your arms (e.g., wheelchair or bedside chair)?: A Lot Help needed to walk  in hospital room?: A Little Help needed climbing 3-5 steps with a railing? : A Lot 6 Click Score: 15    End of Session Equipment Utilized During Treatment: Oxygen Activity Tolerance: Patient tolerated treatment well;No increased pain;Patient limited by fatigue Patient left: in chair;with call bell/phone within reach;with chair alarm set;with nursing/sitter in room;with family/visitor present Nurse Communication: Mobility status PT Visit Diagnosis: Unsteadiness on feet (R26.81);Other abnormalities of gait and mobility (R26.89);Muscle  weakness (generalized) (M62.81)    Time: 4383-7793 PT Time Calculation (min) (ACUTE ONLY): 29 min   Charges:   PT Evaluation $PT Eval Low Complexity: 1 Low PT Treatments $Therapeutic Activity: 8-22 mins        1:37 PM,11/15/21 Domenic Moras, PT, DPT Physical Therapist at [facility]

## 2021-11-15 NOTE — Progress Notes (Signed)
*  PRELIMINARY RESULTS* Echocardiogram 2D Echocardiogram has been performed.  Terry Hernandez 11/15/2021, 12:30 PM

## 2021-11-15 NOTE — Progress Notes (Signed)
Pharmacy Antibiotic Note  Terry Hernandez is a 87 y.o. male admitted on 11/14/2021 with UTI.  Pharmacy has been consulted for Aztreonam dosing.  Plan: Aztreonam 1gm IV q8hrs  Height: 5\' 10"  (177.8 cm) Weight: 64.5 kg (142 lb 3.2 oz) IBW/kg (Calculated) : 73  Temp (24hrs), Avg:98.3 F (36.8 C), Min:97.7 F (36.5 C), Max:98.7 F (37.1 C)  Recent Labs  Lab 11/11/21 1336 11/14/21 1800 11/14/21 2050 11/15/21 0446  WBC 10.4 9.1  --  8.3  CREATININE 1.25* 1.51*  --  1.35*  LATICACIDVEN  --  4.1* 3.3*  --     Estimated Creatinine Clearance: 36.5 mL/min (A) (by C-G formula based on SCr of 1.35 mg/dL (H)).    Allergies  Allergen Reactions   Cephalexin Rash    REACTION: rash    Antimicrobials this admission: 12/3 Aztreonam >>   Dose adjustments this admission:   Microbiology results:  BCx: pending  UCx: pending   Sputum: pending   MRSA PCR: pending  Thank you for allowing pharmacy to be a part of this patient's care.  Hart Robinsons A 11/15/2021 10:57 AM

## 2021-11-15 NOTE — Progress Notes (Signed)
Blood sugar 219

## 2021-11-15 NOTE — Progress Notes (Addendum)
PROGRESS NOTE     Terry Hernandez, is a 87 y.o. male, DOB - 1934-03-10, FIE:[id number]  Admit date - 11/14/2021   Admitting Physician Bernadette Hoit, DO  Outpatient Primary MD for the patient is [name], FNP  LOS - 0  Chief Complaint  Patient presents with   Altered Mental Status        Brief Narrative:  87 y.o. male with medical history significant for paroxysmal A. fib followed by [facility] cardiology, pancreatitis, CKD stage 3, GERD, hypertension, osteoarthritis, hyperlipidemia, prediabetes, chronic back pain, left foot drop, lumbar stenosis with neurogenic claudication admitted on 11/14/2021 with failure to thrive, possible UTI, and  worsening generalized weakness and deconditioning  Assessment & Plan:   Principal Problem:   Generalized weakness Active Problems:   Mixed hyperlipidemia   Essential hypertension   GERD (gastroesophageal reflux disease)   Macrocytic anemia   Hyperglycemia   Atrial fibrillation (HCC)   Acute lower UTI   Pleural effusion   Physical deconditioning   Transaminitis   Obstructive uropathy   Lactic acidosis   Failure to thrive in adult   Hypoalbuminemia due to protein-calorie malnutrition ([place])   History of pancreatitis   1)?? 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 -- as per  Patient's oncologist there is no clinical or radiologic evidence of distant metastatic disease. ---Plastic stent placed to the distal CBD on 10/26/2021, replaced stent on 10/30/2021 with uncovered metal stent --LFTs are trending down -Patient sees gastroenterologist Dr. Watt Climes and oncologist Dr. Benay Spice  3) pleural effusion and dyspnea on exertion--- -Echo with preserved EF of 65 to 70% -Consider therapeutic thoracentesis if clinically worsens   4)AKI----acute kidney injury -suspect dehydration  related -Creatinine on admission 1.51  creatinine was 1.0 on 11/04/2021-    -Creatinine currently down to 1.35 with hydration -- renally adjust medications, avoid nephrotoxic agents / dehydration  / hypotension  5) obstructive uropathy/BPH with Luts--- continue Foley catheter in situ  6)  Spinal stenosis-status post lumbar laminectomy April 2021/generalized weakness--- will need PT upon discharge  7) FTT-- with  Dehydration - -Remeron for appetite stimulation -Nutritional supplements and IV fluids as ordered  8)PAF--history of paroxysmal of A. Fib -Has outpatient appointment with cardiology on 12/16/2021 -Not a candidate for anticoagulation given anemia requiring transfusion and underlying malignancy that may require interventions  9)Macrocytic Anemia--- folate and B12 not low Hgb--currently close to baseline around 10- ---monitor closely and transfuse as clinically indicated  10)GERD--continue PPI especially given concerns for ongoing risk for GI bleed  Disposition/Need for in-Hospital Stay- patient unable to be discharged at this time due to --dehydration and failure to thrive requiring IV fluids possible UTI requiring IV antibiotics  Disposition: The patient is from: Home              Anticipated d/c is to:  Home with HH Vs SNF              Anticipated d/c date is: 2 days              Patient currently is not medically stable to d/c. Barriers: Not Clinically Stable-   Code Status :  -  Code Status: Full Code   Family Communication:    (patient is alert, awake and coherent)  Discussed with son at bedside  Consults  :  na  DVT Prophylaxis  :   - SCDs  enoxaparin (LOVENOX) injection 40 mg Start: 11/15/21 0600 SCDs Start: 11/15/21 0056    Lab Results  Component Value Date   PLT 273 11/15/2021    Inpatient Medications  Scheduled Meds:  Chlorhexidine Gluconate Cloth  6 each Topical Daily   enoxaparin (LOVENOX) injection  40 mg Subcutaneous Q24H   feeding supplement  (GLUCERNA SHAKE)  237 mL Oral TID BM   insulin aspart  0-5 Units Subcutaneous QHS   insulin aspart  0-9 Units Subcutaneous TID WC   [START ON 11/16/2021] metoprolol succinate  25 mg Oral Daily   pantoprazole  40 mg Oral Daily   Continuous Infusions:  aztreonam Stopped (11/15/21 1420)   PRN Meds:.   Anti-infectives (From admission, onward)    Start     Dose/Rate Route Frequency Ordered Stop   11/15/21 1300  aztreonam (AZACTAM) 1 g in sodium chloride 0.9 % 100 mL IVPB        1 g 200 mL/hr over 30 Minutes Intravenous Every 8 hours 11/15/21 1057     11/15/21 1245  cefTRIAXone (ROCEPHIN) 1 g in sodium chloride 0.9 % 100 mL IVPB        1 g 200 mL/hr over 30 Minutes Intravenous  Once 11/15/21 1146 11/15/21 1346         Subjective: [name] today has no fevers, no emesis,  No chest pain,   -Shortness of breath generalized weakness and  poor appetite persist -Patient's son is at bedside, questions answered  Objective: Vitals:   11/15/21 0300 11/15/21 0702 11/15/21 0900 11/15/21 1350  BP: 117/77 (!) 100/51 (!) 98/54 (!) 92/51  Pulse: 97 (!) 111 (!) 115 100  Resp: 19 18  18   Temp: 98.2 F (36.8 C) 98.5 F (36.9 C) 98 F (36.7 C) 97.8 F (36.6 C)  TempSrc: Oral Oral Oral Oral  SpO2:  95% 95% 97%  Weight: 64.5 kg     Height: 5\' 10"  (1.778 m)       Intake/Output Summary (Last 24 hours) at 11/15/2021 1630 Last data filed at 11/15/2021 1613 Gross per 24 hour  Intake 319.98 ml  Output --  Net 319.98 ml   Filed Weights   11/15/21 0300  Weight: 64.5 kg    Physical Exam  Gen:- Awake Alert, chronically ill-appearing HEENT:- [place].AT, No sclera icterus Neck-Supple Neck,No JVD,.  Lungs-diminished in bases , no wheezing  CV- S1, S2 normal, regular  Abd-  +ve B.Sounds, Abd Soft, No tenderness,    Extremity/Skin:- No  edema, pedal pulses present  Psych-affect is appropriate, oriented x3 Neuro-generalized weakness, no new focal deficits, no tremors GU- Foley in situ  Data  Reviewed: I have personally reviewed following labs and imaging studies  CBC: Recent Labs  Lab 11/11/21 1336 11/14/21 1800 11/15/21 0446  WBC 10.4 9.1 8.3  NEUTROABS 6.0 5.4  --   HGB 11.4* 12.1* 10.2*  HCT 35.2* 37.6* 32.8*  MCV 103.2* 107.4* 108.6*  PLT 361 400 944   Basic Metabolic Panel: Recent Labs  Lab 11/11/21 1336 11/14/21 1800 11/15/21 0446  NA 137 136 139  K 4.0 4.1 4.1  CL 100 100 107  CO2 26 25 26   GLUCOSE 276* 306* 233*  BUN 22 29* 28*  CREATININE 1.25* 1.51* 1.35*  CALCIUM 8.4* 8.2* 7.8*  MG  --   --  1.7  PHOS  --   --  2.9   GFR: Estimated Creatinine Clearance: 36.5 mL/min (A) (by C-G formula based on SCr of 1.35 mg/dL (H)). Liver Function  Tests: Recent Labs  Lab 11/11/21 1336 11/14/21 1800 11/15/21 0446  AST 79* 61* 49*  ALT 85* 65* 52*  ALKPHOS 478* 405* 325*  BILITOT 3.2* 2.6* 2.1*  PROT 5.9* 6.3* 5.0*  ALBUMIN 2.8* 2.2* 1.9*   Recent Labs  Lab 11/14/21 1800  LIPASE 57*   No results for input(s): AMMONIA in the last 168 hours. Coagulation Profile: No results for input(s): INR, PROTIME in the last 168 hours. Cardiac Enzymes: No results for input(s): CKTOTAL, CKMB, CKMBINDEX, TROPONINI in the last 168 hours. BNP (last 3 results) No results for input(s): PROBNP in the last 8760 hours. HbA1C: No results for input(s): HGBA1C in the last 72 hours. CBG: Recent Labs  Lab 11/15/21 0852 11/15/21 1124  GLUCAP 216* 224*   Lipid Profile: No results for input(s): CHOL, HDL, LDLCALC, TRIG, CHOLHDL, LDLDIRECT in the last 72 hours. Thyroid Function Tests: No results for input(s): TSH, T4TOTAL, FREET4, T3FREE, THYROIDAB in the last 72 hours. Anemia Panel: Recent Labs    11/15/21 0446  VITAMINB12 1,031*  FOLATE 21.1   Urine analysis:    Component Value Date/Time   COLORURINE YELLOW 11/15/2021 0110   APPEARANCEUR CLEAR 11/15/2021 0110   APPEARANCEUR Clear 05/15/2021 1427   LABSPEC >1.030 (H) 11/15/2021 0110   PHURINE 5.0 11/15/2021  0110   GLUCOSEU >=500 (A) 11/15/2021 0110   HGBUR LARGE (A) 11/15/2021 0110   BILIRUBINUR NEGATIVE 11/15/2021 0110   BILIRUBINUR Negative 05/15/2021 1427   KETONESUR NEGATIVE 11/15/2021 0110   PROTEINUR 30 (A) 11/15/2021 0110   UROBILINOGEN 0.2 05/31/2020 1212   NITRITE POSITIVE (A) 11/15/2021 0110   LEUKOCYTESUR NEGATIVE 11/15/2021 0110   Sepsis Labs: @LABRCNTIP (procalcitonin:4,lacticidven:4)  ) Recent Results (from the past 240 hour(s))  Resp Panel by RT-PCR (Flu A&B, Covid) Nasopharyngeal Swab     Status: None   Collection Time: 11/14/21 10:31 PM   Specimen: Nasopharyngeal Swab; Nasopharyngeal(NP) swabs in vial transport medium  Result Value Ref Range Status   SARS Coronavirus 2 by RT PCR NEGATIVE NEGATIVE Final    Comment: (NOTE) SARS-CoV-2 target nucleic acids are NOT DETECTED.  The SARS-CoV-2 RNA is generally detectable in upper respiratory specimens during the acute phase of infection. The lowest concentration of SARS-CoV-2 viral copies this assay can detect is 138 copies/mL. A negative result does not preclude SARS-Cov-2 infection and should not be used as the sole basis for treatment or other patient management decisions. A negative result may occur with  improper specimen collection/handling, submission of specimen other than nasopharyngeal swab, presence of viral mutation(s) within the areas targeted by this assay, and inadequate number of viral copies(<138 copies/mL). A negative result must be combined with clinical observations, patient history, and epidemiological information. The expected result is Negative.  Fact Sheet for Patients:  EntrepreneurPulse.com.au  Fact Sheet for Healthcare Providers:  IncredibleEmployment.be  This test is no t yet approved or cleared by the [place] FDA and  has been authorized for detection and/or diagnosis of SARS-CoV-2 by FDA under an Emergency Use Authorization (EUA). This EUA will  remain  in effect (meaning this test can be used) for the duration of the COVID-19 declaration under Section 564(b)(1) of the Act, 21 U.S.C.section 360bbb-3(b)(1), unless the authorization is terminated  or revoked sooner.       Influenza A by PCR NEGATIVE NEGATIVE Final   Influenza B by PCR NEGATIVE NEGATIVE Final    Comment: (NOTE) The Xpert Xpress SARS-CoV-2/FLU/RSV plus assay is intended as an aid in the diagnosis of influenza from Nasopharyngeal  swab specimens and should not be used as a sole basis for treatment. Nasal washings and aspirates are unacceptable for Xpert Xpress SARS-CoV-2/FLU/RSV testing.  Fact Sheet for Patients: EntrepreneurPulse.com.au  Fact Sheet for Healthcare Providers: IncredibleEmployment.be  This test is not yet approved or cleared by the [place] FDA and has been authorized for detection and/or diagnosis of SARS-CoV-2 by FDA under an Emergency Use Authorization (EUA). This EUA will remain in effect (meaning this test can be used) for the duration of the COVID-19 declaration under Section 564(b)(1) of the Act, 21 U.S.C. section 360bbb-3(b)(1), unless the authorization is terminated or revoked.  Performed at [facility], [address] [place], [place] [address]       Radiology Studies: DG Chest 2 View  Result Date: 11/14/2021 CLINICAL DATA:  Weak EXAM: CHEST - 2 VIEW COMPARISON:  Chest x-ray 11/11/2021 FINDINGS: The heart and mediastinal contours are unchanged. Aortic calcification. Persistent coarsened increased interstitial markings. No pulmonary edema. Persistent blunting of the costophrenic angles with trace pleural effusions not excluded. No pneumothorax. No acute osseous abnormality. IMPRESSION: 1. No acute cardiopulmonary abnormality. 2. Persistent coarsened increased interstitial markings. 3. Persistent blunting of the costophrenic angles with trace pleural effusions not excluded. Electronically Signed    By: Iven Finn M.D.   On: 11/14/2021 21:06   CT Angio Chest PE W/Cm &/Or Wo Cm  Result Date: 11/15/2021 CLINICAL DATA:  Hypoxia EXAM: CT ANGIOGRAPHY CHEST WITH CONTRAST TECHNIQUE: Multidetector CT imaging of the chest was performed using the standard protocol during bolus administration of intravenous contrast. Multiplanar CT image reconstructions and MIPs were obtained to evaluate the vascular anatomy. CONTRAST:  175mL OMNIPAQUE IOHEXOL 350 MG/ML SOLN COMPARISON:  None. FINDINGS: Cardiovascular: Contrast injection is sufficient to demonstrate satisfactory opacification of the pulmonary arteries to the segmental level. There is no pulmonary embolus or evidence of right heart strain. The size of the main pulmonary artery is normal. Heart size is normal, with no pericardial effusion. The course and caliber of the aorta are normal. There is atherosclerotic calcification. No acute aortic syndrome. Mediastinum/Nodes: No mediastinal, hilar or axillary lymphadenopathy. Normal visualized thyroid. Thoracic esophageal course is normal. Lungs/Pleura: Small right pleural effusion. Interstitial thickening within the right upper lobe. Bibasilar atelectasis. Upper Abdomen: Contrast bolus timing is not optimized for evaluation of the abdominal organs. Pneumobilia. There is a stent in the common bile duct, incompletely visualized. Musculoskeletal: No chest wall abnormality. No bony spinal canal stenosis. Review of the MIP images confirms the above findings. IMPRESSION: 1. No pulmonary embolus or acute aortic syndrome. 2. Small right pleural effusion and mild pulmonary edema. Aortic Atherosclerosis (ICD10-I70.0). Electronically Signed   By: Ulyses Jarred M.D.   On: 11/15/2021 00:25   ECHOCARDIOGRAM COMPLETE  Result Date: 11/15/2021    ECHOCARDIOGRAM REPORT   Patient Name:   Terry Hernandez Date of Exam: 11/15/2021 Medical Rec #:  [id number]     Height:       70.0 in Accession #:    [id number]    Weight:       142.2 lb Date  of Birth:  06/09/1934    BSA:          1.806 m Patient Age:    85 years      BP:           100/51 mmHg Patient Gender: M             HR:           83 bpm. Exam Location:  [facility] Procedure: 2D Echo, Cardiac Doppler and Color Doppler Indications:    CHF-Acute Diastolic W09.81  History:        Patient has no prior history of Echocardiogram examinations.                 Arrythmias:Atrial Fibrillation and PAC; Risk                 Factors:Hypertension and Dyslipidemia.  Sonographer:    Alvino Chapel RCS Referring Phys: [id number] OLADAPO ADEFESO IMPRESSIONS  1. Left ventricular ejection fraction, by estimation, is 65 to 70%. The left ventricle has normal function. The left ventricle has no regional wall motion abnormalities. There is moderate left ventricular hypertrophy. Left ventricular diastolic parameters are indeterminate.  2. Right ventricular systolic function is normal. The right ventricular size is normal.  3. The mitral valve is myxomatous. Trivial mitral valve regurgitation.  4. The aortic valve is tricuspid. Aortic valve regurgitation is not visualized. Aortic valve sclerosis is present, with no evidence of aortic valve stenosis.  5. The inferior vena cava is normal in size with greater than 50% respiratory variability, suggesting right atrial pressure of 3 mmHg. FINDINGS  Left Ventricle: Left ventricular ejection fraction, by estimation, is 65 to 70%. The left ventricle has normal function. The left ventricle has no regional wall motion abnormalities. The left ventricular internal cavity size was normal in size. There is  moderate left ventricular hypertrophy. Left ventricular diastolic parameters are indeterminate. Right Ventricle: The right ventricular size is normal. Right vetricular wall thickness was not assessed. Right ventricular systolic function is normal. Left Atrium: Left atrial size was normal in size. Right Atrium: Right atrial size was normal in size. Pericardium: There is no evidence of  pericardial effusion. Mitral Valve: The mitral valve is myxomatous. There is mild thickening of the mitral valve leaflet(s). Trivial mitral valve regurgitation. Tricuspid Valve: The tricuspid valve is normal in structure. Tricuspid valve regurgitation is trivial. Aortic Valve: The aortic valve is tricuspid. Aortic valve regurgitation is not visualized. Aortic valve sclerosis is present, with no evidence of aortic valve stenosis. Pulmonic Valve: The pulmonic valve was grossly normal. Pulmonic valve regurgitation is trivial. Aorta: The aortic root is normal in size and structure. Venous: The inferior vena cava is normal in size with greater than 50% respiratory variability, suggesting right atrial pressure of 3 mmHg. IAS/Shunts: No atrial level shunt detected by color flow Doppler.  LEFT VENTRICLE PLAX 2D LVIDd:         3.60 cm   Diastology LVIDs:         2.40 cm   LV e' medial:    6.31 cm/s LV PW:         1.30 cm   LV E/e' medial:  9.3 LV IVS:        1.40 cm   LV e' lateral:   7.62 cm/s LVOT diam:     2.00 cm   LV E/e' lateral: 7.7 LV SV:         59 LV SV Index:   33 LVOT Area:     3.14 cm  RIGHT VENTRICLE RV S prime:     13.10 cm/s TAPSE (M-mode): 1.8 cm LEFT ATRIUM           Index        RIGHT ATRIUM           Index LA diam:      2.50 cm 1.38 cm/m   RA Area:  18.50 cm LA Vol (A4C): 38.4 ml 21.27 ml/m  RA Volume:   56.80 ml  31.46 ml/m  AORTIC VALVE LVOT Vmax:   131.00 cm/s LVOT Vmean:  80.300 cm/s LVOT VTI:    0.189 m  AORTA Ao Root diam: 3.30 cm MITRAL VALVE MV Area (PHT): 3.95 cm    SHUNTS MV Decel Time: 192 msec    Systemic VTI:  0.19 m MV E velocity: 58.55 cm/s  Systemic Diam: 2.00 cm MV A velocity: 91.50 cm/s MV E/A ratio:  0.64 Dorris Carnes MD Electronically signed by Dorris Carnes MD Signature Date/Time: 11/15/2021/3:41:58 PM    Final      Scheduled Meds:  Chlorhexidine Gluconate Cloth  6 each Topical Daily   enoxaparin (LOVENOX) injection  40 mg Subcutaneous Q24H   feeding supplement (GLUCERNA  SHAKE)  237 mL Oral TID BM   insulin aspart  0-5 Units Subcutaneous QHS   insulin aspart  0-9 Units Subcutaneous TID WC   [START ON 11/16/2021] metoprolol succinate  25 mg Oral Daily   pantoprazole  40 mg Oral Daily   Continuous Infusions:  aztreonam Stopped (11/15/21 1420)     LOS: 0 days    Roxan Hockey M.D on 11/15/2021 at 4:30 PM  Go to [email or web address] - for contact info  [facility] - Office  [phone]  If 7PM-7AM, please contact night-coverage [email or web address] Password [email or web address] 11/15/2021, 4:30 PM

## 2021-11-15 NOTE — Plan of Care (Signed)
  Problem: Acute Rehab PT Goals(only PT should resolve) Goal: Pt Will Go Supine/Side To Sit Outcome: Progressing Flowsheets (Taken 11/15/2021 1338) Pt will go Supine/Side to Sit: with supervision Goal: Patient Will Transfer Sit To/From Stand Outcome: Progressing Flowsheets (Taken 11/15/2021 1338) Patient will transfer sit to/from stand:  with min guard assist  with supervision Goal: Pt Will Transfer Bed To Chair/Chair To Bed Outcome: Progressing Flowsheets (Taken 11/15/2021 1338) Pt will Transfer Bed to Chair/Chair to Bed: min guard assist Goal: Pt Will Perform Standing Balance Or Pre-Gait Outcome: Progressing Flowsheets (Taken 11/15/2021 1338) Pt will perform standing balance or pre-gait:  with min guard assist  with bilateral UE support Goal: Pt Will Ambulate Outcome: Progressing Flowsheets (Taken 11/15/2021 1338) Pt will Ambulate:  50 feet  with min guard assist  with least restrictive assistive device  1:38 PM,11/15/21 Domenic Moras, PT, DPT Physical Therapist at [facility]

## 2021-11-15 NOTE — Plan of Care (Signed)

## 2021-11-16 LAB — GLUCOSE, CAPILLARY
Glucose-Capillary: 161 mg/dL — ABNORMAL HIGH (ref 70–99)
Glucose-Capillary: 113 mg/dL — ABNORMAL HIGH (ref 70–99)
Glucose-Capillary: 218 mg/dL — ABNORMAL HIGH (ref 70–99)

## 2021-11-16 LAB — HEPATIC FUNCTION PANEL
ALT: 46 U/L — ABNORMAL HIGH (ref 0–44)
AST: 46 U/L — ABNORMAL HIGH (ref 15–41)
Albumin: 1.8 g/dL — ABNORMAL LOW (ref 3.5–5.0)
Alkaline Phosphatase: 315 U/L — ABNORMAL HIGH (ref 38–126)
Bilirubin, Direct: 0.8 mg/dL — ABNORMAL HIGH (ref 0.0–0.2)
Indirect Bilirubin: 0.8 mg/dL (ref 0.3–0.9)
Total Bilirubin: 1.6 mg/dL — ABNORMAL HIGH (ref 0.3–1.2)
Total Protein: 5 g/dL — ABNORMAL LOW (ref 6.5–8.1)

## 2021-11-16 MED ORDER — MIRTAZAPINE 15 MG PO TBDP
7.5000 mg | ORAL_TABLET | Freq: Every day | ORAL | Status: DC
  Administered 2021-11-16 – 2021-11-17 (×2): 7.5 mg via ORAL
  Filled 2021-11-16 (×3): qty 0.5

## 2021-11-16 NOTE — Progress Notes (Signed)
   11/16/21 0628  Assess: MEWS Score  MEWS Temp 0  MEWS Systolic 0  MEWS Pulse 2  MEWS RR 0  MEWS LOC 0  MEWS Score 2  MEWS Score Color Yellow  Assess: if the MEWS score is Yellow or Red  Were vital signs taken at a resting state? Yes  Focused Assessment No change from prior assessment  Early Detection of Sepsis Score *See Row Information* Low  MEWS guidelines implemented *See Row Information* Yes  Escalate  MEWS: Escalate Yellow: discuss with charge nurse/RN and consider discussing with provider and RRT  Notify: Charge Nurse/RN  Name of Charge Nurse/RN Notified Lawernce Keas, RN  Date Charge Nurse/RN Notified 11/16/21  Time Charge Nurse/RN Notified [phone]

## 2021-11-16 NOTE — Progress Notes (Addendum)
PROGRESS NOTE     Terry Hernandez, is a 87 y.o. male, DOB - Nov 29, 1934, TIW:[id number]  Admit date - 11/14/2021   Admitting Physician Dravyn Severs Denton Brick, MD  Outpatient Primary MD for the patient is Terry Perking, FNP  LOS - 1  Chief Complaint  Patient presents with   Altered Mental Status        Brief Narrative:  87 y.o. male with medical history significant for paroxysmal A. fib followed by [facility] cardiology, pancreatitis, CKD stage 3, GERD, hypertension, osteoarthritis, hyperlipidemia, prediabetes, chronic back pain, left foot drop, lumbar stenosis with neurogenic claudication admitted on 11/14/2021 with failure to thrive, possible UTI, and  worsening generalized weakness and deconditioning  Assessment & Plan:   Principal Problem:   Generalized weakness Active Problems:   Mixed hyperlipidemia   Essential hypertension   GERD (gastroesophageal reflux disease)   Macrocytic anemia   Hyperglycemia   Atrial fibrillation (HCC)   Acute lower UTI   Pleural effusion   Physical deconditioning   Transaminitis   Obstructive uropathy   Lactic acidosis   Failure to thrive in adult   Hypoalbuminemia due to protein-calorie malnutrition ([place])   History of pancreatitis   FTT (failure to thrive) in adult   1)GNR  CAUTI--- continue IV Rocephin pending culture data  2)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 -- as per  Patient's oncologist there is no clinical or radiologic evidence of distant metastatic disease. ---Plastic stent placed to the distal CBD on 10/26/2021, replaced stent on 10/30/2021 with uncovered metal stent --LFTs are trending down -Patient sees gastroenterologist Dr. Watt Climes and oncologist Dr. Benay Spice  3) pleural effusion and dyspnea on exertion--- -Echo with preserved EF of 65 to 70% -Consider therapeutic thoracentesis if clinically worsens   4)AKI----acute kidney  injury -suspect dehydration related -Creatinine on admission 1.51  creatinine was 1.0 on 11/04/2021-    -Creatinine currently down to 1.35 with hydration -- renally adjust medications, avoid nephrotoxic agents / dehydration  / hypotension  5) obstructive uropathy/BPH with Luts--- continue Foley catheter in situ  6)  Spinal stenosis-status post lumbar laminectomy April 2021/generalized weakness--- will need PT upon discharge  7) FTT-- with  Dehydration - -Remeron for appetite stimulation -Nutritional supplements and IV fluids as ordered  8)PAF--history of paroxysmal of A. Fib -Has outpatient appointment with cardiology on 12/16/2021 -Not a candidate for anticoagulation given anemia requiring transfusion and underlying malignancy that may require interventions  9)Macrocytic Anemia--- folate and B12 not low Hgb--currently close to baseline around 10- ---monitor closely and transfuse as clinically indicated  10)GERD--continue PPI especially given concerns for ongoing risk for GI bleed  11) social/ethics--- plan of care and advanced directives discussed with patient and son, they request DNR status -No limitations to treatment at this time  Disposition/Need for in-Hospital Stay- patient unable to be discharged at this time due to --dehydration and failure to thrive requiring IV fluids, UTI requiring IV antibiotics  Disposition: The patient is from: Home              Anticipated d/c is to: SNF              Anticipated d/c date is: 2 days              Patient currently is not medically stable to d/c. Barriers: Not Clinically Stable-   Code Status :  -  Code Status: DNR   Family Communication:    (patient is  alert, awake and coherent)  Discussed with son at bedside  Consults  :  na  DVT Prophylaxis  :   - SCDs  enoxaparin (LOVENOX) injection 40 mg Start: 11/15/21 0600 SCDs Start: 11/15/21 0056    Lab Results  Component Value Date   PLT 273 11/15/2021    Inpatient  Medications  Scheduled Meds:  Chlorhexidine Gluconate Cloth  6 each Topical Daily   enoxaparin (LOVENOX) injection  40 mg Subcutaneous Q24H   feeding supplement (GLUCERNA SHAKE)  237 mL Oral TID BM   insulin aspart  0-5 Units Subcutaneous QHS   insulin aspart  0-9 Units Subcutaneous TID WC   metoprolol succinate  25 mg Oral Daily   mirtazapine  7.5 mg Oral QHS   pantoprazole  40 mg Oral Daily   Continuous Infusions:  cefTRIAXone (ROCEPHIN)  IV 1 g (11/16/21 0955)   PRN Meds:.   Anti-infectives (From admission, onward)    Start     Dose/Rate Route Frequency Ordered Stop   11/16/21 1000  cefTRIAXone (ROCEPHIN) 1 g in sodium chloride 0.9 % 100 mL IVPB        1 g 200 mL/hr over 30 Minutes Intravenous Every 24 hours 11/15/21 1633     11/15/21 1300  aztreonam (AZACTAM) 1 g in sodium chloride 0.9 % 100 mL IVPB  Status:  Discontinued        1 g 200 mL/hr over 30 Minutes Intravenous Every 8 hours 11/15/21 1057 11/15/21 1633   11/15/21 1245  cefTRIAXone (ROCEPHIN) 1 g in sodium chloride 0.9 % 100 mL IVPB        1 g 200 mL/hr over 30 Minutes Intravenous  Once 11/15/21 1146 11/15/21 1346         Subjective: [name] today has no fevers, no emesis,  No chest pain,   -Patient sleepy today after getting Remeron last evening -Son at bedside, questions answered  Objective: Vitals:   11/15/21 2115 11/16/21 0628 11/16/21 0936 11/16/21 1437  BP: (!) 101/59 (!) 112/57 117/65 103/63  Pulse: (!) 106 (!) 121 (!) 121 (!) 106  Resp: 17 16 20 20   Temp: 98.4 F (36.9 C) 98.3 F (36.8 C) 98 F (36.7 C) 98.6 F (37 C)  TempSrc:      SpO2: 92% 92% 96% 99%  Weight:      Height:        Intake/Output Summary (Last 24 hours) at 11/16/2021 1740 Last data filed at 11/16/2021 0955 Gross per 24 hour  Intake 1221.56 ml  Output 600 ml  Net 621.56 ml   Filed Weights   11/15/21 0300  Weight: 64.5 kg    Physical Exam  Gen:- Awake Alert, chronically ill-appearing HEENT:- [place].AT, No sclera  icterus Neck-Supple Neck,No JVD,.  Lungs-diminished in bases , no wheezing  CV- S1, S2 normal, regular  Abd-  +ve B.Sounds, Abd Soft, No tenderness,    Extremity/Skin:- No  edema, pedal pulses present  Psych-affect is appropriate, oriented x3 Neuro-generalized weakness, no new focal deficits, no tremors GU- Foley in situ  Data Reviewed: I have personally reviewed following labs and imaging studies  CBC: Recent Labs  Lab 11/11/21 1336 11/14/21 1800 11/15/21 0446  WBC 10.4 9.1 8.3  NEUTROABS 6.0 5.4  --   HGB 11.4* 12.1* 10.2*  HCT 35.2* 37.6* 32.8*  MCV 103.2* 107.4* 108.6*  PLT 361 400 161   Basic Metabolic Panel: Recent Labs  Lab 11/11/21 1336 11/14/21 1800 11/15/21 0446  NA 137 136 139  K  4.0 4.1 4.1  CL 100 100 107  CO2 26 25 26   GLUCOSE 276* 306* 233*  BUN 22 29* 28*  CREATININE 1.25* 1.51* 1.35*  CALCIUM 8.4* 8.2* 7.8*  MG  --   --  1.7  PHOS  --   --  2.9   GFR: Estimated Creatinine Clearance: 36.5 mL/min (A) (by C-G formula based on SCr of 1.35 mg/dL (H)). Liver Function Tests: Recent Labs  Lab 11/11/21 1336 11/14/21 1800 11/15/21 0446 11/16/21 0559  AST 79* 61* 49* 46*  ALT 85* 65* 52* 46*  ALKPHOS 478* 405* 325* 315*  BILITOT 3.2* 2.6* 2.1* 1.6*  PROT 5.9* 6.3* 5.0* 5.0*  ALBUMIN 2.8* 2.2* 1.9* 1.8*   Recent Labs  Lab 11/14/21 1800  LIPASE 57*   No results for input(s): AMMONIA in the last 168 hours. Coagulation Profile: No results for input(s): INR, PROTIME in the last 168 hours. Cardiac Enzymes: No results for input(s): CKTOTAL, CKMB, CKMBINDEX, TROPONINI in the last 168 hours. BNP (last 3 results) No results for input(s): PROBNP in the last 8760 hours. HbA1C: No results for input(s): HGBA1C in the last 72 hours. CBG: Recent Labs  Lab 11/15/21 1640 11/15/21 2117 11/16/21 0827 11/16/21 1204 11/16/21 1626  GLUCAP 280* 280* 161* 113* 218*   Lipid Profile: No results for input(s): CHOL, HDL, LDLCALC, TRIG, CHOLHDL, LDLDIRECT in  the last 72 hours. Thyroid Function Tests: No results for input(s): TSH, T4TOTAL, FREET4, T3FREE, THYROIDAB in the last 72 hours. Anemia Panel: Recent Labs    11/15/21 0446  VITAMINB12 1,031*  FOLATE 21.1   Urine analysis:    Component Value Date/Time   COLORURINE YELLOW 11/15/2021 0110   APPEARANCEUR CLEAR 11/15/2021 0110   APPEARANCEUR Clear 05/15/2021 1427   LABSPEC >1.030 (H) 11/15/2021 0110   PHURINE 5.0 11/15/2021 0110   GLUCOSEU >=500 (A) 11/15/2021 0110   HGBUR LARGE (A) 11/15/2021 0110   BILIRUBINUR NEGATIVE 11/15/2021 0110   BILIRUBINUR Negative 05/15/2021 1427   KETONESUR NEGATIVE 11/15/2021 0110   PROTEINUR 30 (A) 11/15/2021 0110   UROBILINOGEN 0.2 05/31/2020 1212   NITRITE POSITIVE (A) 11/15/2021 0110   LEUKOCYTESUR NEGATIVE 11/15/2021 0110   Sepsis Labs: @LABRCNTIP (procalcitonin:4,lacticidven:4)  ) Recent Results (from the past 240 hour(s))  Resp Panel by RT-PCR (Flu A&B, Covid) Nasopharyngeal Swab     Status: None   Collection Time: 11/14/21 10:31 PM   Specimen: Nasopharyngeal Swab; Nasopharyngeal(NP) swabs in vial transport medium  Result Value Ref Range Status   SARS Coronavirus 2 by RT PCR NEGATIVE NEGATIVE Final    Comment: (NOTE) SARS-CoV-2 target nucleic acids are NOT DETECTED.  The SARS-CoV-2 RNA is generally detectable in upper respiratory specimens during the acute phase of infection. The lowest concentration of SARS-CoV-2 viral copies this assay can detect is 138 copies/mL. A negative result does not preclude SARS-Cov-2 infection and should not be used as the sole basis for treatment or other patient management decisions. A negative result may occur with  improper specimen collection/handling, submission of specimen other than nasopharyngeal swab, presence of viral mutation(s) within the areas targeted by this assay, and inadequate number of viral copies(<138 copies/mL). A negative result must be combined with clinical observations, patient  history, and epidemiological information. The expected result is Negative.  Fact Sheet for Patients:  EntrepreneurPulse.com.au  Fact Sheet for Healthcare Providers:  IncredibleEmployment.be  This test is no t yet approved or cleared by the [place] FDA and  has been authorized for detection and/or diagnosis of SARS-CoV-2 by FDA  under an Emergency Use Authorization (EUA). This EUA will remain  in effect (meaning this test can be used) for the duration of the COVID-19 declaration under Section 564(b)(1) of the Act, 21 U.S.C.section 360bbb-3(b)(1), unless the authorization is terminated  or revoked sooner.       Influenza A by PCR NEGATIVE NEGATIVE Final   Influenza B by PCR NEGATIVE NEGATIVE Final    Comment: (NOTE) The Xpert Xpress SARS-CoV-2/FLU/RSV plus assay is intended as an aid in the diagnosis of influenza from Nasopharyngeal swab specimens and should not be used as a sole basis for treatment. Nasal washings and aspirates are unacceptable for Xpert Xpress SARS-CoV-2/FLU/RSV testing.  Fact Sheet for Patients: EntrepreneurPulse.com.au  Fact Sheet for Healthcare Providers: IncredibleEmployment.be  This test is not yet approved or cleared by the [place] FDA and has been authorized for detection and/or diagnosis of SARS-CoV-2 by FDA under an Emergency Use Authorization (EUA). This EUA will remain in effect (meaning this test can be used) for the duration of the COVID-19 declaration under Section 564(b)(1) of the Act, 21 U.S.C. section 360bbb-3(b)(1), unless the authorization is terminated or revoked.  Performed at [facility], [address] [place], [place] [address]   Urine Culture     Status: Abnormal (Preliminary result)   Collection Time: 11/15/21  7:17 AM   Specimen: Urine, Catheterized  Result Value Ref Range Status   Specimen Description   Final    URINE, CATHETERIZED Performed  at [facility], [address], [place], [place] [address]    Special Requests   Final    NONE Performed at [facility], [address], [place], [place] [address]    Culture >=100,000 COLONIES/mL GRAM NEGATIVE RODS (A)  Final   Report Status PENDING  Incomplete      Radiology Studies: DG Chest 2 View  Result Date: 11/14/2021 CLINICAL DATA:  Weak EXAM: CHEST - 2 VIEW COMPARISON:  Chest x-ray 11/11/2021 FINDINGS: The heart and mediastinal contours are unchanged. Aortic calcification. Persistent coarsened increased interstitial markings. No pulmonary edema. Persistent blunting of the costophrenic angles with trace pleural effusions not excluded. No pneumothorax. No acute osseous abnormality. IMPRESSION: 1. No acute cardiopulmonary abnormality. 2. Persistent coarsened increased interstitial markings. 3. Persistent blunting of the costophrenic angles with trace pleural effusions not excluded. Electronically Signed   By: Iven Finn M.D.   On: 11/14/2021 21:06   CT Angio Chest PE W/Cm &/Or Wo Cm  Result Date: 11/15/2021 CLINICAL DATA:  Hypoxia EXAM: CT ANGIOGRAPHY CHEST WITH CONTRAST TECHNIQUE: Multidetector CT imaging of the chest was performed using the standard protocol during bolus administration of intravenous contrast. Multiplanar CT image reconstructions and MIPs were obtained to evaluate the vascular anatomy. CONTRAST:  12mL OMNIPAQUE IOHEXOL 350 MG/ML SOLN COMPARISON:  None. FINDINGS: Cardiovascular: Contrast injection is sufficient to demonstrate satisfactory opacification of the pulmonary arteries to the segmental level. There is no pulmonary embolus or evidence of right heart strain. The size of the main pulmonary artery is normal. Heart size is normal, with no pericardial effusion. The course and caliber of the aorta are normal. There is atherosclerotic calcification. No acute aortic syndrome. Mediastinum/Nodes: No mediastinal, hilar or axillary lymphadenopathy. Normal visualized  thyroid. Thoracic esophageal course is normal. Lungs/Pleura: Small right pleural effusion. Interstitial thickening within the right upper lobe. Bibasilar atelectasis. Upper Abdomen: Contrast bolus timing is not optimized for evaluation of the abdominal organs. Pneumobilia. There is a stent in the common bile duct, incompletely visualized. Musculoskeletal: No chest wall abnormality. No bony  spinal canal stenosis. Review of the MIP images confirms the above findings. IMPRESSION: 1. No pulmonary embolus or acute aortic syndrome. 2. Small right pleural effusion and mild pulmonary edema. Aortic Atherosclerosis (ICD10-I70.0). Electronically Signed   By: Ulyses Jarred M.D.   On: 11/15/2021 00:25   ECHOCARDIOGRAM COMPLETE  Result Date: 11/15/2021    ECHOCARDIOGRAM REPORT   Patient Name:   Terry Hernandez Date of Exam: 11/15/2021 Medical Rec #:  [id number]     Height:       70.0 in Accession #:    [id number]    Weight:       142.2 lb Date of Birth:  1934/12/04    BSA:          1.806 m Patient Age:    29 years      BP:           100/51 mmHg Patient Gender: M             HR:           83 bpm. Exam Location:  [facility] Procedure: 2D Echo, Cardiac Doppler and Color Doppler Indications:    CHF-Acute Diastolic W43.15  History:        Patient has no prior history of Echocardiogram examinations.                 Arrythmias:Atrial Fibrillation and PAC; Risk                 Factors:Hypertension and Dyslipidemia.  Sonographer:    Alvino Chapel RCS Referring Phys: [id number] OLADAPO ADEFESO IMPRESSIONS  1. Left ventricular ejection fraction, by estimation, is 65 to 70%. The left ventricle has normal function. The left ventricle has no regional wall motion abnormalities. There is moderate left ventricular hypertrophy. Left ventricular diastolic parameters are indeterminate.  2. Right ventricular systolic function is normal. The right ventricular size is normal.  3. The mitral valve is myxomatous. Trivial mitral valve regurgitation.  4. The  aortic valve is tricuspid. Aortic valve regurgitation is not visualized. Aortic valve sclerosis is present, with no evidence of aortic valve stenosis.  5. The inferior vena cava is normal in size with greater than 50% respiratory variability, suggesting right atrial pressure of 3 mmHg. FINDINGS  Left Ventricle: Left ventricular ejection fraction, by estimation, is 65 to 70%. The left ventricle has normal function. The left ventricle has no regional wall motion abnormalities. The left ventricular internal cavity size was normal in size. There is  moderate left ventricular hypertrophy. Left ventricular diastolic parameters are indeterminate. Right Ventricle: The right ventricular size is normal. Right vetricular wall thickness was not assessed. Right ventricular systolic function is normal. Left Atrium: Left atrial size was normal in size. Right Atrium: Right atrial size was normal in size. Pericardium: There is no evidence of pericardial effusion. Mitral Valve: The mitral valve is myxomatous. There is mild thickening of the mitral valve leaflet(s). Trivial mitral valve regurgitation. Tricuspid Valve: The tricuspid valve is normal in structure. Tricuspid valve regurgitation is trivial. Aortic Valve: The aortic valve is tricuspid. Aortic valve regurgitation is not visualized. Aortic valve sclerosis is present, with no evidence of aortic valve stenosis. Pulmonic Valve: The pulmonic valve was grossly normal. Pulmonic valve regurgitation is trivial. Aorta: The aortic root is normal in size and structure. Venous: The inferior vena cava is normal in size with greater than 50% respiratory variability, suggesting right atrial pressure of 3 mmHg. IAS/Shunts: No atrial level shunt detected by color flow Doppler.  LEFT VENTRICLE PLAX 2D LVIDd:         3.60 cm   Diastology LVIDs:         2.40 cm   LV e' medial:    6.31 cm/s LV PW:         1.30 cm   LV E/e' medial:  9.3 LV IVS:        1.40 cm   LV e' lateral:   7.62 cm/s LVOT  diam:     2.00 cm   LV E/e' lateral: 7.7 LV SV:         59 LV SV Index:   33 LVOT Area:     3.14 cm  RIGHT VENTRICLE RV S prime:     13.10 cm/s TAPSE (M-mode): 1.8 cm LEFT ATRIUM           Index        RIGHT ATRIUM           Index LA diam:      2.50 cm 1.38 cm/m   RA Area:     18.50 cm LA Vol (A4C): 38.4 ml 21.27 ml/m  RA Volume:   56.80 ml  31.46 ml/m  AORTIC VALVE LVOT Vmax:   131.00 cm/s LVOT Vmean:  80.300 cm/s LVOT VTI:    0.189 m  AORTA Ao Root diam: 3.30 cm MITRAL VALVE MV Area (PHT): 3.95 cm    SHUNTS MV Decel Time: 192 msec    Systemic VTI:  0.19 m MV E velocity: 58.55 cm/s  Systemic Diam: 2.00 cm MV A velocity: 91.50 cm/s MV E/A ratio:  0.64 Dorris Carnes MD Electronically signed by Dorris Carnes MD Signature Date/Time: 11/15/2021/3:41:58 PM    Final      Scheduled Meds:  Chlorhexidine Gluconate Cloth  6 each Topical Daily   enoxaparin (LOVENOX) injection  40 mg Subcutaneous Q24H   feeding supplement (GLUCERNA SHAKE)  237 mL Oral TID BM   insulin aspart  0-5 Units Subcutaneous QHS   insulin aspart  0-9 Units Subcutaneous TID WC   metoprolol succinate  25 mg Oral Daily   mirtazapine  7.5 mg Oral QHS   pantoprazole  40 mg Oral Daily   Continuous Infusions:  cefTRIAXone (ROCEPHIN)  IV 1 g (11/16/21 0955)     LOS: 1 day    Roxan Hockey M.D on 11/16/2021 at 5:40 PM  Go to [email or web address] - for contact info  [facility] - Office  [phone]  If 7PM-7AM, please contact night-coverage [email or web address] Password [email or web address] 11/16/2021, 5:40 PM

## 2021-11-16 NOTE — TOC Initial Note (Signed)
Transition of Care [facility]) - Initial/Assessment Note    Patient Details  Name: Terry Hernandez MRN: [id number] Date of Birth: [birth year]  [facility]) CM/SW Contact:    Natasha Bence, LCSW Phone Number: 11/16/2021, 2:23 PM  Clinical Narrative:                 Patient is a 87 year old male admitted for Generalized weakness. CSW conducted initial assessment. Patient's son reported that patient is able to ambulate with cane at baseline. Patient's son reported that patient is agreeable to SNF referral to local SNF. CSW referred patient and started off auth. TOC to follow.  Expected Discharge Plan: Skilled Nursing Facility Barriers to Discharge: Continued Medical Work up   Patient Goals and CMS Choice Patient states their goals for this hospitalization and ongoing recovery are:: Rehab with SNF CMS Medicare.gov Compare Post Acute Care list provided to:: Patient Choice offered to / list presented to : Patient, Adult Children  Expected Discharge Plan and Services Expected Discharge Plan: [place] arrangements for the past 2 months: Single Family Home                                      Prior Living Arrangements/Services Living arrangements for the past 2 months: Single Family Home Lives with:: Spouse, Self Patient language and need for interpreter reviewed:: Yes Do you feel safe going back to the place where you live?: Yes      Need for Family Participation in Patient Care: Yes (Comment) Care giver support system in place?: Yes (comment)   Criminal Activity/Legal Involvement Pertinent to Current Situation/Hospitalization: No - Comment as needed  Activities of Daily Living Home Assistive Devices/Equipment: Cane (specify quad or straight), Grab bars around toilet, Built-in shower seat, Walker (specify type), Wheelchair, Grab bars in shower ADL Screening (condition at time of admission) Patient's cognitive ability adequate to safely  complete daily activities?: Yes Is the patient deaf or have difficulty hearing?: No Does the patient have difficulty seeing, even when wearing glasses/contacts?: No Does the patient have difficulty concentrating, remembering, or making decisions?: No Patient able to express need for assistance with ADLs?: Yes Does the patient have difficulty dressing or bathing?: No Independently performs ADLs?: No Communication: Independent Dressing (OT): Needs assistance Is this a change from baseline?: Pre-admission baseline Grooming: Needs assistance Is this a change from baseline?: Pre-admission baseline Feeding: Independent Is this a change from baseline?: Pre-admission baseline Bathing: Needs assistance Is this a change from baseline?: Pre-admission baseline Toileting: Needs assistance Is this a change from baseline?: Pre-admission baseline In/Out Bed: Needs assistance Is this a change from baseline?: Pre-admission baseline Walks in Home: Independent Does the patient have difficulty walking or climbing stairs?: Yes Weakness of Legs: Both Weakness of Arms/Hands: None  Permission Sought/Granted Permission sought to share information with : Family Supports Permission granted to share information with : Yes, Verbal Permission Granted  Share Information with NAME: Fee,STEVE  Permission granted to share info w AGENCY: Local SNF's  Permission granted to share info w Relationship: son  Permission granted to share info w Contact Information: [phone]  Emotional Assessment     Affect (typically observed): Accepting, Adaptable Orientation: : Oriented to Self, Oriented to Situation, Oriented to Place, Oriented to  Time Alcohol / Substance Use: Not Applicable Psych Involvement: No (comment)  Admission diagnosis:  Weakness [R53.1] Biliary  tract cancer ([place]) [C24.9] Hypoxia [R09.02] Generalized weakness [R53.1] Anemia, unspecified type [D64.9] FTT (failure to thrive) in adult  [R62.7] Patient Active Problem List   Diagnosis Date Noted   Acute lower UTI 11/15/2021   Pleural effusion 11/15/2021   Physical deconditioning 11/15/2021   Transaminitis 11/15/2021   Obstructive uropathy 11/15/2021   Lactic acidosis 11/15/2021   Failure to thrive in adult 11/15/2021   Hypoalbuminemia due to protein-calorie malnutrition ([place]) 11/15/2021   History of pancreatitis 11/15/2021   FTT (failure to thrive) in adult 11/15/2021   Generalized weakness 11/14/2021   Atrial fibrillation ([place]) 11/04/2021   Hyperbilirubinemia 10/24/2021   Common bile duct (CBD) stricture 10/24/2021   Essential hypertension    GERD (gastroesophageal reflux disease)    Guaiac positive stools    AKI (acute kidney injury) ([place])    Leukocytosis    Elevated liver enzymes    Macrocytic anemia    Hyperglycemia    Hyponatremia    Hypotension    Abnormal weight loss    Jaundice    Lumbar stenosis with neurogenic claudication 03/18/2020   Lumbar spinal stenosis 03/18/2020   PREMATURE ATRIAL CONTRACTIONS 09/04/2009   PALPITATIONS 09/04/2009   Mixed hyperlipidemia 09/03/2009   PCP:  [name], FNP Pharmacy:   [place], [place] [address] Phone: [phone] Fax: [phone]     Social Determinants of Health (SDOH) Interventions    Readmission Risk Interventions No flowsheet data found.

## 2021-11-16 NOTE — NC FL2 (Signed)
[place] LEVEL OF CARE SCREENING TOOL     IDENTIFICATION  Patient Name: Terry Hernandez Birthdate: 07-22-1934 Sex: male Admission Date (Current Location): 11/14/2021  [facility] and [place] Number:  [employer] and Address:  [place] [address] [place]      Provider Number: [id number]  Attending Physician Name and Address:  Roxan Hockey, MD  Relative Name and Phone Number:  DELVON, CHIPPS)   [phone]    Current Level of Care: Hospital Recommended Level of Care: [place] Prior Approval Number:    Date Approved/Denied:   PASRR Number: Not able to access [place] Must. Will follow up with [place] must Monday  Discharge Plan: SNF    Current Diagnoses: Patient Active Problem List   Diagnosis Date Noted   Acute lower UTI 11/15/2021   Pleural effusion 11/15/2021   Physical deconditioning 11/15/2021   Transaminitis 11/15/2021   Obstructive uropathy 11/15/2021   Lactic acidosis 11/15/2021   Failure to thrive in adult 11/15/2021   Hypoalbuminemia due to protein-calorie malnutrition ([place]) 11/15/2021   History of pancreatitis 11/15/2021   FTT (failure to thrive) in adult 11/15/2021   Generalized weakness 11/14/2021   Atrial fibrillation ([place]) 11/04/2021   Hyperbilirubinemia 10/24/2021   Common bile duct (CBD) stricture 10/24/2021   Essential hypertension    GERD (gastroesophageal reflux disease)    Guaiac positive stools    AKI (acute kidney injury) ([place])    Leukocytosis    Elevated liver enzymes    Macrocytic anemia    Hyperglycemia    Hyponatremia    Hypotension    Abnormal weight loss    Jaundice    Lumbar stenosis with neurogenic claudication 03/18/2020   Lumbar spinal stenosis 03/18/2020   PREMATURE ATRIAL CONTRACTIONS 09/04/2009   PALPITATIONS 09/04/2009   Mixed hyperlipidemia 09/03/2009    Orientation RESPIRATION BLADDER Height & Weight     Self, Time, Situation, Place  Normal  Continent Weight: 142 lb 3.2 oz (64.5 kg) Height:  5\' 10"  (177.8 cm)  BEHAVIORAL SYMPTOMS/MOOD NEUROLOGICAL BOWEL NUTRITION STATUS      Continent Diet (Diet Heart Room service appropriate? Yes; Fluid consistency: Thin)  AMBULATORY STATUS COMMUNICATION OF NEEDS Skin   Extensive Assist Verbally Other (Comment) (Eccymosis bilateral arm and leg)                       Personal Care Assistance Level of Assistance  Bathing, Feeding, Dressing Bathing Assistance: Maximum assistance Feeding assistance: Independent Dressing Assistance: Limited assistance     Functional Limitations Info  Sight, Hearing, Speech Sight Info: Impaired Hearing Info: Adequate Speech Info: Adequate    SPECIAL CARE FACTORS FREQUENCY  PT (By licensed PT)     PT Frequency: 5x              Contractures Contractures Info: Not present    Additional Factors Info  Code Status, Allergies Code Status Info: DNR Allergies Info: Cephalexin           Current Medications (11/16/2021):  This is the current hospital active medication list Current Facility-Administered Medications  Medication Dose Route Frequency Provider Last Rate Last Admin   0.9 %  sodium chloride infusion   Intravenous Continuous Emokpae, Courage, MD 50 mL/hr at 11/15/21 1729 Infusion Verify at 11/15/21 1729   cefTRIAXone (ROCEPHIN) 1 g in sodium chloride 0.9 % 100 mL IVPB  1 g Intravenous Q24H Roxan Hockey, MD       Chlorhexidine Gluconate  Cloth 2 % PADS 6 each  6 each Topical Daily Emokpae, Courage, MD   6 each at 11/15/21 1029   enoxaparin (LOVENOX) injection 40 mg  40 mg Subcutaneous Q24H Adefeso, Oladapo, DO   40 mg at 11/16/21 0524   feeding supplement (GLUCERNA SHAKE) (GLUCERNA SHAKE) liquid 237 mL  237 mL Oral TID BM Adefeso, Oladapo, DO   237 mL at 11/15/21 2203   insulin aspart (novoLOG) injection 0-5 Units  0-5 Units Subcutaneous QHS Adefeso, Oladapo, DO   3 Units at 11/15/21 2202   insulin aspart (novoLOG) injection 0-9 Units   0-9 Units Subcutaneous TID WC Adefeso, Oladapo, DO   5 Units at 11/15/21 1653   metoprolol succinate (TOPROL-XL) 24 hr tablet 25 mg  25 mg Oral Daily Emokpae, Courage, MD       mirtazapine (REMERON SOL-TAB) disintegrating tablet 7.5 mg  7.5 mg Oral QHS Emokpae, Courage, MD       pantoprazole (PROTONIX) EC tablet 40 mg  40 mg Oral Daily Adefeso, Oladapo, DO   40 mg at 11/15/21 6578     Discharge Medications: Please see discharge summary for a list of discharge medications.  Relevant Imaging Results:  Relevant Lab Results:   Additional Information Pt SSN [id number]  Natasha Bence, LCSW

## 2021-11-17 DIAGNOSIS — E440 Moderate protein-calorie malnutrition: Secondary | ICD-10-CM | POA: Insufficient documentation

## 2021-11-17 LAB — COMPREHENSIVE METABOLIC PANEL
ALT: 42 U/L (ref 0–44)
AST: 46 U/L — ABNORMAL HIGH (ref 15–41)
Albumin: 1.8 g/dL — ABNORMAL LOW (ref 3.5–5.0)
Alkaline Phosphatase: 305 U/L — ABNORMAL HIGH (ref 38–126)
Anion gap: 5 (ref 5–15)
BUN: 23 mg/dL (ref 8–23)
CO2: 25 mmol/L (ref 22–32)
Calcium: 7.7 mg/dL — ABNORMAL LOW (ref 8.9–10.3)
Chloride: 106 mmol/L (ref 98–111)
Creatinine, Ser: 1.36 mg/dL — ABNORMAL HIGH (ref 0.61–1.24)
GFR, Estimated: 51 mL/min — ABNORMAL LOW (ref >60)
Glucose, Bld: 167 mg/dL — ABNORMAL HIGH (ref 70–99)
Potassium: 3.4 mmol/L — ABNORMAL LOW (ref 3.5–5.1)
Sodium: 136 mmol/L (ref 135–145)
Total Bilirubin: 1.5 mg/dL — ABNORMAL HIGH (ref 0.3–1.2)
Total Protein: 5.1 g/dL — ABNORMAL LOW (ref 6.5–8.1)

## 2021-11-17 LAB — URINE CULTURE: Culture: >=100000 — AB

## 2021-11-17 LAB — CBC
HCT: 31.2 % — ABNORMAL LOW (ref 39.0–52.0)
Hemoglobin: 9.8 g/dL — ABNORMAL LOW (ref 13.0–17.0)
MCH: 33.2 pg (ref 26.0–34.0)
MCHC: 31.4 g/dL (ref 30.0–36.0)
MCV: 105.8 fL — ABNORMAL HIGH (ref 80.0–100.0)
Platelets: 342 10*3/uL (ref 150–400)
RBC: 2.95 MIL/uL — ABNORMAL LOW (ref 4.22–5.81)
RDW: 13.8 % (ref 11.5–15.5)
WBC: 12.2 10*3/uL — ABNORMAL HIGH (ref 4.0–10.5)
nRBC: 0 % (ref 0.0–0.2)

## 2021-11-17 LAB — GLUCOSE, CAPILLARY
Glucose-Capillary: 151 mg/dL — ABNORMAL HIGH (ref 70–99)
Glucose-Capillary: 212 mg/dL — ABNORMAL HIGH (ref 70–99)
Glucose-Capillary: 246 mg/dL — ABNORMAL HIGH (ref 70–99)
Glucose-Capillary: 211 mg/dL — ABNORMAL HIGH (ref 70–99)

## 2021-11-17 MED ORDER — MIRTAZAPINE 15 MG PO TBDP: 7.5000 mg | ORAL_TABLET | Freq: Every day | ORAL | 1 refills | Status: DC

## 2021-11-17 MED ORDER — SILODOSIN 4 MG PO CAPS: 4.0000 mg | ORAL_CAPSULE | Freq: Every day | ORAL | 1 refills | Status: DC

## 2021-11-17 MED ORDER — CIPROFLOXACIN HCL 500 MG PO TABS: 500.0000 mg | ORAL_TABLET | Freq: Two times a day (BID) | ORAL | 0 refills | Status: AC

## 2021-11-17 MED ORDER — SENNOSIDES-DOCUSATE SODIUM 8.6-50 MG PO TABS: 2.0000 | ORAL_TABLET | Freq: Every day | ORAL | 1 refills | Status: AC

## 2021-11-17 MED ORDER — ENSURE ENLIVE PO LIQD: 237.0000 mL | Freq: Three times a day (TID) | ORAL | 12 refills | Status: AC

## 2021-11-17 MED ORDER — ENSURE ENLIVE PO LIQD
237.0000 mL | Freq: Three times a day (TID) | ORAL | Status: DC
  Administered 2021-11-17 – 2021-11-18 (×3): 237 mL via ORAL

## 2021-11-17 NOTE — TOC Progression Note (Signed)
Pts PASRR is [id number].

## 2021-11-17 NOTE — Discharge Summary (Addendum)
Terry Hernandez, is a 87 y.o. male  DOB 1934/02/03  MRN [id number].  Admission date:  11/14/2021  Admitting Physician  Roxan Hockey, MD  Discharge Date:  11/17/2021   Primary MD  [name], FNP  Recommendations for primary care physician for things to follow:   1)Please repeat CBC and CMP blood work in about 1 week from now 2)Please follow-up with your oncologist Dr. Benay Spice as previously scheduled after  CT scan 3)Your  CT abdomen and pelvis-test is  scheduled for  November 24, 2021 4)Please follow-up with Urologist Dr. Alyson Ingles in about --- for possible removal of the Foley catheter-- and  recheck and follow-up evaluation  in his office----[facility], [address], [place] [address] Phone Number----[phone]   Admission Diagnosis  Weakness [R53.1] Biliary tract cancer ([place]) [C24.9] Hypoxia [R09.02] Generalized weakness [R53.1] Anemia, unspecified type [D64.9] FTT (failure to thrive) in adult [R62.7]   Discharge Diagnosis  Weakness [R53.1] Biliary tract cancer ([place]) [C24.9] Hypoxia [R09.02] Generalized weakness [R53.1] Anemia, unspecified type [D64.9] FTT (failure to thrive) in adult [R62.7]    Principal Problem:   E Coli Acute lower UTI Active Problems:   Failure to thrive in adult   Hypoalbuminemia due to protein-calorie malnutrition ([place])   Essential hypertension   Atrial fibrillation ([place])   Mixed hyperlipidemia   GERD (gastroesophageal reflux disease)   Macrocytic anemia   Hyperglycemia   Generalized weakness   Pleural effusion   Physical deconditioning   Transaminitis   Obstructive uropathy   Lactic acidosis   History of pancreatitis   FTT (failure to thrive) in adult   Malnutrition of moderate degree      Past Medical History:  Diagnosis Date   Arthritis    GERD (gastroesophageal reflux disease)    PMH   Glucose intolerance  (impaired glucose tolerance)    High cholesterol    History of kidney stones    HTN (hypertension)    Hyperlipidemia    Lumbar stenosis with neurogenic claudication    Pneumonia    Subcutaneous mass    Wears glasses     Past Surgical History:  Procedure Laterality Date   APPENDECTOMY     BILIARY BRUSHING  10/26/2021   Procedure: BILIARY BRUSHING;  Surgeon: Clarene Essex, MD;  Location: [place];  Service: Endoscopy;;   BILIARY STENT PLACEMENT  10/26/2021   Procedure: BILIARY STENT PLACEMENT;  Surgeon: Clarene Essex, MD;  Location: [facility] ENDOSCOPY;  Service: Endoscopy;;   BILIARY STENT PLACEMENT  10/30/2021   Procedure: BILIARY STENT PLACEMENT;  Surgeon: Clarene Essex, MD;  Location: [place];  Service: Endoscopy;;   COLONOSCOPY W/ BIOPSIES AND POLYPECTOMY     ERCP N/A 10/26/2021   Procedure: ENDOSCOPIC RETROGRADE CHOLANGIOPANCREATOGRAPHY (ERCP);  Surgeon: Clarene Essex, MD;  Location: [place];  Service: Endoscopy;  Laterality: N/A;   ERCP N/A 10/30/2021   Procedure: ENDOSCOPIC RETROGRADE CHOLANGIOPANCREATOGRAPHY (ERCP);  Surgeon: Clarene Essex, MD;  Location: [place];  Service: Endoscopy;  Laterality: N/A;   ESOPHAGOGASTRODUODENOSCOPY N/A 10/26/2021  Procedure: ESOPHAGOGASTRODUODENOSCOPY (EGD);  Surgeon: Clarene Essex, MD;  Location: [place];  Service: Endoscopy;  Laterality: N/A;   LUMBAR LAMINECTOMY/DECOMPRESSION MICRODISCECTOMY Bilateral 03/18/2020   Procedure: Bilateral Lumbar three-four Lumbar four-five Laminotomy/foraminotomy;  Surgeon: Kristeen Miss, MD;  Location: [place];  Service: Neurosurgery;  Laterality: Bilateral;   MASS EXCISION Right 03/18/2020   Procedure: Excision of a Right gluteal subcutaneous mass;  Surgeon: Kristeen Miss, MD;  Location: [place];  Service: Neurosurgery;  Laterality: Right;   MULTIPLE TOOTH EXTRACTIONS     SPHINCTEROTOMY  10/26/2021   Procedure: SPHINCTEROTOMY;  Surgeon: Clarene Essex, MD;  Location[facility]COPY;  Service: Endoscopy;;   STENT  REMOVAL  10/30/2021   Procedure: STENT REMOVAL;  Surgeon: Clarene Essex, MD;  Location: [facility] ENDOSCOPY;  Service: Endoscopy;;     HPI  from the history and physical done on the day of admission:      Chief Complaint: Generalized weakness   HPI: Terry Hernandez is a 87 y.o. male with medical history significant for paroxysmal A. fib followed by [facility] cardiology, pancreatitis, CKD stage 3, GERD, hypertension, osteoarthritis, hyperlipidemia, prediabetes, chronic back pain, left foot drop, lumbar stenosis with neurogenic claudication who presents to the emergency department accompanied by son due to worsening generalized weakness over the last few days, patient has lost about 9 pounds within last week per son at bedside.  Patient has not been able to get out of bed within last 48 hours due to weakness, he lives with elderly wife who was incapable of being able to assist in ambulation.  Diet has decreased recently and he took a whole lot of effort to be able to assist in getting out of bed today.  Patient was noted to have very little urine production today, so he presented to the ED for further evaluation and management accompanied by son. He was recently admitted from 11/11-11/16 due to distal common bile duct stricture with transaminitis during which ERCP was done and stent was placed on 10/26/2021 and 10-day course of antibiotic therapy with Augmentin on discharge was provided. He denies chest pain, shortness of breath, fever, chills, nausea, vomiting, abdominal pain, diarrhea or constipation   ED Course:  In the emergency department, he was hemodynamically stable.  Work-up in the ED showed elevated MCV, BUN slight creatinine 29/1.51 (baseline creatinine at 1.8-2.4), lactic acid 4.1 > 3.3, transaminitis, total bilirubin 2.6 hypoalbuminemia, lipase 57, urinalysis was positive for glycosuria, hematuria and positive nitrite.  Influenza A, B, SARS coronavirus 2 was negative. Chest x-ray showed no acute  cardiopulmonary abnormality CT angiography of chest with contrast showed no pulmonary embolus or acute aortic syndrome but showed small right pleural effusion and mild pulmonary edema. IV hydration was provided.  Hospitalist was asked to admit patient for further evaluation and management.     Hospital Course:     -Brief Narrative:  87 y.o. male with medical history significant for paroxysmal A. fib followed by [facility] cardiology, pancreatitis, CKD stage 3, GERD, hypertension, osteoarthritis, hyperlipidemia, prediabetes, chronic back pain, left foot drop, lumbar stenosis with neurogenic claudication admitted on 11/14/2021 with failure to thrive, possible UTI, and  worsening generalized weakness and deconditioning  -Disposition-patient was discharged to SNF facility on 11/17/2021, however SNF facility was unable to pick patient up -On 11/18/2021 patient and son requested discharge home with home health services instead of discharge to SNF facility -So patient is now discharged  Home with [facility] and Home oxygen  A/p 1)E coli CAUTI--- treated with IV Rocephin, much improved, -Okay  to discharge on p.o. 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 -- as per  Patient's oncologist there is no clinical or radiologic evidence of distant metastatic disease. ---Plastic stent placed to the distal CBD on 10/26/2021, replaced stent on 10/30/2021 with uncovered metal stent --LFTs are trending down -Patient sees gastroenterologist Dr. Watt Climes and oncologist Dr. Benay Spice   3) chronic hypoxic respiratory failure in the setting of pleural effusion  -Overall stable, currently requiring 2 L of oxygen via nasal cannula -Echo with preserved EF of 65 to 70% --Consider therapeutic thoracentesis if clinically worsens    4)AKI----acute kidney injury -suspect dehydration related -Creatinine on admission 1.51,     creatinine was 1.0 on 11/04/2021-    -Creatinine currently down to 1.36 with hydration -- renally adjust medications, avoid nephrotoxic agents / dehydration  / hypotension   5)Obstructive uropathy/BPH with Luts--- continue Foley catheter in situ -Foley catheter changed on 11/17/2021 -Follow-up with Dr. Alyson Ingles and urologist for voiding trial as outpatient   6)Spinal Stenosis/Generalized Weakness-status post lumbar laminectomy April 2021-- Physical Therapy eval appreciated, recommends SNF rehab   7) FTT-- with  Dehydration - -Remeron 7.5 mg qhs for appetite stimulation -Nutritional supplements -Dehydration improved with IV fluids  8)PAF--history of paroxysmal of A. Fib -Has outpatient appointment with cardiology on 12/16/2021 -Not a candidate for anticoagulation given anemia requiring transfusion and underlying malignancy that may require interventions   9)Macrocytic Anemia--- folate and B12 are not low Hgb--currently close to baseline around 10-   10)GERD--continue Protonix especially given concerns for ongoing risk for GI bleed   11)Social/Ethics--- plan of care and advanced directives discussed with patient and son, they request DNR status -No limitations to treatment at this time    Disposition: The patient is from: Home      Disposition-patient was discharged to SNF facility on 11/17/2021, however SNF facility was unable to pick patient up -On 11/18/2021 patient and son requested discharge home with home health services instead of discharge to SNF facility -So patient is now discharged  Home with [facility] and Home oxygen                 Code Status :  -  Code Status: DNR    Family Communication:    (patient is alert, awake and coherent)  Discussed with son at bedside   Consults  :  na  Discharge Condition: Stable  Follow UP   Contact information for after-discharge care     [facility] .   Service: Skilled Nursing Contact  information: [address] [place] [phone]                     Diet and Activity recommendation:  As advised  Discharge Instructions    Discharge Instructions     Call MD for:  difficulty breathing, headache or visual disturbances   Complete by: As directed    Call MD for:  persistant dizziness or light-headedness   Complete by: As directed    Call MD for:  persistant nausea and vomiting   Complete by: As directed    Call MD for:  temperature >100.4   Complete by: As directed    Diet general   Complete by: As directed    Discharge instructions   Complete by: As directed    1)Please repeat CBC and CMP blood work  in about 1 week from now 2)Please follow-up with your oncologist Dr. Benay Spice as previously scheduled after  CT scan 3)Your  CT abdomen and pelvis-test is  scheduled for  November 24, 2021 4)Please follow-up with Urologist Dr. Alyson Ingles in about --- for possible removal of the Foley catheter-- and  recheck and follow-up evaluation  in his office----[facility], [address], [place] [address] Phone Number----[phone]   Increase activity slowly   Complete by: As directed          Discharge Medications     Allergies as of 11/17/2021       Reactions   Cephalexin Rash   REACTION: rash        Medication List     STOP taking these medications    VITAMIN B COMPLEX PO   VITAMIN C PO       TAKE these medications    ciprofloxacin 500 MG tablet Commonly known as: Cipro Take 1 tablet (500 mg total) by mouth 2 (two) times daily for 3 days.   feeding supplement Liqd Take 237 mLs by mouth 3 (three) times daily between meals.   methocarbamol 500 MG tablet Commonly known as: ROBAXIN Take 1 tablet (500 mg total) by mouth every 6 (six) hours as needed for muscle spasms.   metoprolol succinate 25 MG 24 hr tablet Commonly known as: TOPROL-XL Take 0.5 tablets (12.5 mg total) by mouth  daily.   mirtazapine 15 MG disintegrating tablet Commonly known as: REMERON SOL-TAB Take 0.5 tablets (7.5 mg total) by mouth at bedtime.   multivitamin tablet Take 1 tablet by mouth at bedtime.   pantoprazole 40 MG tablet Commonly known as: Protonix Take 1 tablet (40 mg total) by mouth daily.   senna-docusate 8.6-50 MG tablet Commonly known as: Senokot-S Take 2 tablets by mouth at bedtime.   silodosin 4 MG Caps capsule Commonly known as: RAPAFLO Take 1 capsule (4 mg total) by mouth daily with breakfast. What changed:  medication strength how much to take        Major procedures and Radiology Reports - PLEASE review detailed and final reports for all details, in brief -   DG Chest 2 View  Result Date: 11/14/2021 CLINICAL DATA:  Weak EXAM: CHEST - 2 VIEW COMPARISON:  Chest x-ray 11/11/2021 FINDINGS: The heart and mediastinal contours are unchanged. Aortic calcification. Persistent coarsened increased interstitial markings. No pulmonary edema. Persistent blunting of the costophrenic angles with trace pleural effusions not excluded. No pneumothorax. No acute osseous abnormality. IMPRESSION: 1. No acute cardiopulmonary abnormality. 2. Persistent coarsened increased interstitial markings. 3. Persistent blunting of the costophrenic angles with trace pleural effusions not excluded. Electronically Signed   By: Iven Finn M.D.   On: 11/14/2021 21:06   DG Chest 2 View  Result Date: 11/12/2021 CLINICAL DATA:  New diagnosis of pancreatic cancer with decreased breath sounds on the right. EXAM: CHEST - 2 VIEW COMPARISON:  Radiographs 04/16/2020. abdominal CT 10/23/2021. FINDINGS: The heart size and mediastinal contours are stable with aortic atherosclerosis. There is a new small right pleural effusion associated with mild right lower lobe atelectasis. The left lung is clear. There is no pneumothorax. Degenerative changes are present throughout the spine. A biliary stent is noted in the  right upper quadrant of the abdomen with associated pneumobilia. IMPRESSION: New small right pleural effusion with associated right basilar atelectasis following biliary stenting for pancreatic cancer. Electronically Signed   By: Richardean Sale M.D.   On: 11/12/2021 16:57  CT Angio Chest PE W/Cm &/Or Wo Cm  Result Date: 11/15/2021 CLINICAL DATA:  Hypoxia EXAM: CT ANGIOGRAPHY CHEST WITH CONTRAST TECHNIQUE: Multidetector CT imaging of the chest was performed using the standard protocol during bolus administration of intravenous contrast. Multiplanar CT image reconstructions and MIPs were obtained to evaluate the vascular anatomy. CONTRAST:  121mL OMNIPAQUE IOHEXOL 350 MG/ML SOLN COMPARISON:  None. FINDINGS: Cardiovascular: Contrast injection is sufficient to demonstrate satisfactory opacification of the pulmonary arteries to the segmental level. There is no pulmonary embolus or evidence of right heart strain. The size of the main pulmonary artery is normal. Heart size is normal, with no pericardial effusion. The course and caliber of the aorta are normal. There is atherosclerotic calcification. No acute aortic syndrome. Mediastinum/Nodes: No mediastinal, hilar or axillary lymphadenopathy. Normal visualized thyroid. Thoracic esophageal course is normal. Lungs/Pleura: Small right pleural effusion. Interstitial thickening within the right upper lobe. Bibasilar atelectasis. Upper Abdomen: Contrast bolus timing is not optimized for evaluation of the abdominal organs. Pneumobilia. There is a stent in the common bile duct, incompletely visualized. Musculoskeletal: No chest wall abnormality. No bony spinal canal stenosis. Review of the MIP images confirms the above findings. IMPRESSION: 1. No pulmonary embolus or acute aortic syndrome. 2. Small right pleural effusion and mild pulmonary edema. Aortic Atherosclerosis (ICD10-I70.0). Electronically Signed   By: Ulyses Jarred M.D.   On: 11/15/2021 00:25   CT Abdomen Pelvis  W Contrast  Result Date: 10/23/2021 CLINICAL DATA:  Abdominal distension, jaundice EXAM: CT ABDOMEN AND PELVIS WITH CONTRAST TECHNIQUE: Multidetector CT imaging of the abdomen and pelvis was performed using the standard protocol following bolus administration of intravenous contrast. CONTRAST:  87mL OMNIPAQUE IOHEXOL 300 MG/ML  SOLN COMPARISON:  [facility] CT abdomen/pelvis dated 04/16/2020 FINDINGS: Lower chest: Mild subpleural reticulation at the lung bases. Hepatobiliary: Liver is within normal limits. Gallbladder sludge (series 2/image 29), without associated inflammatory changes. Mild intrahepatic and moderate extrahepatic ductal dilatation, new from the prior. Common duct measures 2.4 cm centrally (coronal image 42) and sharply tapers at the ampulla (coronal image 43), without convincing obstructing mass on CT. Pancreas: Within normal limits. Note is made of peripancreatic inflammatory changes on the prior, which are improved. Spleen: Within normal limits. Adrenals/Urinary Tract: Adrenal glands are within normal limits. Kidneys are within normal limits.  No hydronephrosis. Bladder is within normal limits. Stomach/Bowel: Stomach is within normal limits. Prior inflammatory changes along the duodenal bulb (at the site of possible prior duodenal ulcer) have resolved. No evidence of bowel obstruction. Appendix is not discretely visualized. Mild sigmoid diverticulosis, without evidence of diverticulitis. Vascular/Lymphatic: No evidence of abdominal aortic aneurysm. Atherosclerotic calcifications of the abdominal aorta and branch vessels. No suspicious abdominopelvic lymphadenopathy. Reproductive: Prostate is mildly enlarged and indents the base of the bladder, suggesting BPH. Other: No abdominopelvic ascites. Musculoskeletal: Degenerative changes of the visualized thoracolumbar spine. Moderate compression fracture deformity at L5, chronic. IMPRESSION: Mild intrahepatic and moderate extrahepatic occluded 64  new from the prior. Common duct measures 2.4 cm and abruptly tapers at the ampulla, without convincing obstructing mass on CT. This appearance favors a distal CBD stricture, possibly on the basis of prior pancreatitis/duodenitis given the prior CT. ERCP is suggested. Gallbladder sludge, without associated inflammatory changes. Electronically Signed   By: Julian Hy M.D.   On: 10/23/2021 19:58   DG ERCP  Result Date: 10/30/2021 CLINICAL DATA:  ERCP EXAM: ERCP TECHNIQUE: Multiple spot images obtained with the fluoroscopic device and submitted for interpretation post-procedure. FLUOROSCOPY TIME:  Refer to procedure  report. COMPARISON:  None. FINDINGS: A total of 3 fluoroscopic spot films/series are submitted for review taken during ERCP. Images show a scope overlying the right upper quadrant. A wire has catheterized the common bile duct. Contrast material opacifies both the intrahepatic and extrahepatic biliary tree. There is no contrast material seen past the mid CBD. Final images demonstrate a metallic biliary stent in place with some degree of waisting along its mid to distal aspect. IMPRESSION: Fluoroscopic images taken during ERCP as described. Refer to procedure report for full details. These images were submitted for radiologic interpretation only. Please see the procedural report for the amount of contrast and the fluoroscopy time utilized. Electronically Signed   By: Albin Felling M.D.   On: 10/30/2021 15:24   DG ERCP  Result Date: 10/26/2021 CLINICAL DATA:  87 year old male with distal common bile duct obstruction. EXAM: ERCP TECHNIQUE: Multiple spot images obtained with the fluoroscopic device and submitted for interpretation post-procedure. FLUOROSCOPY TIME:  Fluoroscopy Time:  6 minutes, 17 seconds Radiation Exposure Index (if provided by the fluoroscopic device): 67 Number of Acquired Spot Images: 7 COMPARISON:  CT abdomen pelvis from 10/23/2021 FINDINGS: Duodenal scope position within  the second portion the duodenum. There is retrograde cannulation of the common bile duct. Retrograde cholangiogram demonstrates marked intra and extrahepatic biliary ductal dilation to the level of the mid common bile duct. Common bile duct stent is placed. IMPRESSION: Distal common bile duct obstruction with marked intra and extrahepatic biliary ductal dilation. Placement of a common bile duct stent. Please refer to the Endoscopist's operative note for further procedural details. Ruthann Cancer, MD [facility] Radiology Electronically Signed   By: Ruthann Cancer M.D.   On: 10/26/2021 09:34   ECHOCARDIOGRAM COMPLETE  Result Date: 11/15/2021    ECHOCARDIOGRAM REPORT   Patient Name:   TASEAN MANCHA Date of Exam: 11/15/2021 Medical Rec #:  [id number]     Height:       70.0 in Accession #:    [id number]    Weight:       142.2 lb Date of Birth:  1934/11/17    BSA:          1.806 m Patient Age:    27 years      BP:           100/51 mmHg Patient Gender: M             HR:           83 bpm. Exam Location:  [facility] Procedure: 2D Echo, Cardiac Doppler and Color Doppler Indications:    CHF-Acute Diastolic W29.56  History:        Patient has no prior history of Echocardiogram examinations.                 Arrythmias:Atrial Fibrillation and PAC; Risk                 Factors:Hypertension and Dyslipidemia.  Sonographer:    Alvino Chapel RCS Referring Phys: [id number] OLADAPO ADEFESO IMPRESSIONS  1. Left ventricular ejection fraction, by estimation, is 65 to 70%. The left ventricle has normal function. The left ventricle has no regional wall motion abnormalities. There is moderate left ventricular hypertrophy. Left ventricular diastolic parameters are indeterminate.  2. Right ventricular systolic function is normal. The right ventricular size is normal.  3. The mitral valve is myxomatous. Trivial mitral valve regurgitation.  4. The aortic valve is tricuspid. Aortic valve regurgitation  is not  visualized. Aortic valve sclerosis is present, with no evidence of aortic valve stenosis.  5. The inferior vena cava is normal in size with greater than 50% respiratory variability, suggesting right atrial pressure of 3 mmHg. FINDINGS  Left Ventricle: Left ventricular ejection fraction, by estimation, is 65 to 70%. The left ventricle has normal function. The left ventricle has no regional wall motion abnormalities. The left ventricular internal cavity size was normal in size. There is  moderate left ventricular hypertrophy. Left ventricular diastolic parameters are indeterminate. Right Ventricle: The right ventricular size is normal. Right vetricular wall thickness was not assessed. Right ventricular systolic function is normal. Left Atrium: Left atrial size was normal in size. Right Atrium: Right atrial size was normal in size. Pericardium: There is no evidence of pericardial effusion. Mitral Valve: The mitral valve is myxomatous. There is mild thickening of the mitral valve leaflet(s). Trivial mitral valve regurgitation. Tricuspid Valve: The tricuspid valve is normal in structure. Tricuspid valve regurgitation is trivial. Aortic Valve: The aortic valve is tricuspid. Aortic valve regurgitation is not visualized. Aortic valve sclerosis is present, with no evidence of aortic valve stenosis. Pulmonic Valve: The pulmonic valve was grossly normal. Pulmonic valve regurgitation is trivial. Aorta: The aortic root is normal in size and structure. Venous: The inferior vena cava is normal in size with greater than 50% respiratory variability, suggesting right atrial pressure of 3 mmHg. IAS/Shunts: No atrial level shunt detected by color flow Doppler.  LEFT VENTRICLE PLAX 2D LVIDd:         3.60 cm   Diastology LVIDs:         2.40 cm   LV e' medial:    6.31 cm/s LV PW:         1.30 cm   LV E/e' medial:  9.3 LV IVS:        1.40 cm   LV e' lateral:   7.62 cm/s LVOT diam:     2.00 cm   LV E/e' lateral: 7.7 LV SV:         59  LV SV Index:   33 LVOT Area:     3.14 cm  RIGHT VENTRICLE RV S prime:     13.10 cm/s TAPSE (M-mode): 1.8 cm LEFT ATRIUM           Index        RIGHT ATRIUM           Index LA diam:      2.50 cm 1.38 cm/m   RA Area:     18.50 cm LA Vol (A4C): 38.4 ml 21.27 ml/m  RA Volume:   56.80 ml  31.46 ml/m  AORTIC VALVE LVOT Vmax:   131.00 cm/s LVOT Vmean:  80.300 cm/s LVOT VTI:    0.189 m  AORTA Ao Root diam: 3.30 cm MITRAL VALVE MV Area (PHT): 3.95 cm    SHUNTS MV Decel Time: 192 msec    Systemic VTI:  0.19 m MV E velocity: 58.55 cm/s  Systemic Diam: 2.00 cm MV A velocity: 91.50 cm/s MV E/A ratio:  0.64 Dorris Carnes MD Electronically signed by Dorris Carnes MD Signature Date/Time: 11/15/2021/3:41:58 PM    Final     Micro Results  Recent Results (from the past 240 hour(s))  Resp Panel by RT-PCR (Flu A&B, Covid) Nasopharyngeal Swab     Status: None   Collection Time: 11/14/21 10:31 PM   Specimen: Nasopharyngeal Swab; Nasopharyngeal(NP) swabs in vial transport medium  Result Value Ref Range Status  SARS Coronavirus 2 by RT PCR NEGATIVE NEGATIVE Final    Comment: (NOTE) SARS-CoV-2 target nucleic acids are NOT DETECTED.  The SARS-CoV-2 RNA is generally detectable in upper respiratory specimens during the acute phase of infection. The lowest concentration of SARS-CoV-2 viral copies this assay can detect is 138 copies/mL. A negative result does not preclude SARS-Cov-2 infection and should not be used as the sole basis for treatment or other patient management decisions. A negative result may occur with  improper specimen collection/handling, submission of specimen other than nasopharyngeal swab, presence of viral mutation(s) within the areas targeted by this assay, and inadequate number of viral copies(<138 copies/mL). A negative result must be combined with clinical observations, patient history, and epidemiological information. The expected result is Negative.  Fact Sheet for Patients:   EntrepreneurPulse.com.au  Fact Sheet for Healthcare Providers:  IncredibleEmployment.be  This test is no t yet approved or cleared by the [place] FDA and  has been authorized for detection and/or diagnosis of SARS-CoV-2 by FDA under an Emergency Use Authorization (EUA). This EUA will remain  in effect (meaning this test can be used) for the duration of the COVID-19 declaration under Section 564(b)(1) of the Act, 21 U.S.C.section 360bbb-3(b)(1), unless the authorization is terminated  or revoked sooner.       Influenza A by PCR NEGATIVE NEGATIVE Final   Influenza B by PCR NEGATIVE NEGATIVE Final    Comment: (NOTE) The Xpert Xpress SARS-CoV-2/FLU/RSV plus assay is intended as an aid in the diagnosis of influenza from Nasopharyngeal swab specimens and should not be used as a sole basis for treatment. Nasal washings and aspirates are unacceptable for Xpert Xpress SARS-CoV-2/FLU/RSV testing.  Fact Sheet for Patients: EntrepreneurPulse.com.au  Fact Sheet for Healthcare Providers: IncredibleEmployment.be  This test is not yet approved or cleared by the [place] FDA and has been authorized for detection and/or diagnosis of SARS-CoV-2 by FDA under an Emergency Use Authorization (EUA). This EUA will remain in effect (meaning this test can be used) for the duration of the COVID-19 declaration under Section 564(b)(1) of the Act, 21 U.S.C. section 360bbb-3(b)(1), unless the authorization is terminated or revoked.  Performed at [facility], [address], [place], [place] [address]   Urine Culture     Status: Abnormal   Collection Time: 11/15/21  7:17 AM   Specimen: Urine, Catheterized  Result Value Ref Range Status   Specimen Description   Final    URINE, CATHETERIZED Performed at [facility], [address], [place], [place] [address]    Special Requests   Final    NONE Performed at [facility], [address], [place], [place] [address]    Culture >=100,000 COLONIES/mL ESCHERICHIA COLI (A)  Final   Report Status 11/17/2021 FINAL  Final   Organism ID, Bacteria ESCHERICHIA COLI (A)  Final      Susceptibility   Escherichia coli - MIC*    AMPICILLIN <=2 SENSITIVE Sensitive     CEFAZOLIN <=4 SENSITIVE Sensitive     CEFEPIME <=0.12 SENSITIVE Sensitive     CEFTRIAXONE <=0.25 SENSITIVE Sensitive     CIPROFLOXACIN <=0.25 SENSITIVE Sensitive     GENTAMICIN <=1 SENSITIVE Sensitive     IMIPENEM 1 SENSITIVE Sensitive     NITROFURANTOIN <=16 SENSITIVE Sensitive     TRIMETH/SULFA <=20 SENSITIVE Sensitive     AMPICILLIN/SULBACTAM <=2 SENSITIVE Sensitive     PIP/TAZO <=4 SENSITIVE Sensitive     * >=100,000 COLONIES/mL ESCHERICHIA COLI       Today  Subjective    [name] today has no new complaints  -Eating and drinking better No fever  Or chills   No Nausea, Vomiting or Diarrhea       Disposition-patient was discharged to SNF facility on 11/17/2021, however SNF facility was unable to pick patient up -On 11/18/2021 patient and son requested discharge home with home health services instead of discharge to SNF facility -So patient is now discharged  Home with [facility] and Home oxygen   Patient has been seen and examined prior to discharge   Objective   Blood pressure (!) 101/58, pulse (!) 117, temperature 98 F (36.7 C), temperature source Oral, resp. rate 17, height 5\' 10"  (1.778 m), weight 64.5 kg, SpO2 95 %.   Intake/Output Summary (Last 24 hours) at 11/17/2021 1409 Last data filed at 11/17/2021 1300 Gross per 24 hour  Intake 710 ml  Output 550 ml  Net 160 ml    Exam Gen:- Awake Alert, chronically ill-appearing HEENT:- [place].AT, No sclera icterus Nose- [place] 3L/min Neck-Supple Neck,No JVD,.  Lungs-improved air movement,, no wheezing  CV- S1, S2 normal, regular  Abd-  +ve B.Sounds, Abd Soft, No tenderness,    Extremity/Skin:- No  edema, pedal pulses present  Psych-affect  is appropriate, oriented x3 Neuro-generalized weakness, no new focal deficits, no tremors GU- Foley in situ (changed 11/17/2021)   Data Review   CBC w Diff:  Lab Results  Component Value Date   WBC 12.2 (H) 11/17/2021   HGB 9.8 (L) 11/17/2021   HGB 11.4 (L) 11/11/2021   HGB 10.0 (L) 11/04/2021   HCT 31.2 (L) 11/17/2021   HCT 27.6 (L) 11/04/2021   PLT 342 11/17/2021   PLT 361 11/11/2021   PLT 305 11/04/2021   LYMPHOPCT 31 11/14/2021   MONOPCT 11 11/14/2021   EOSPCT 3 11/14/2021   BASOPCT 1 11/14/2021   CMP:  Lab Results  Component Value Date   NA 136 11/17/2021   NA 142 11/04/2021   K 3.4 (L) 11/17/2021   CL 106 11/17/2021   CO2 25 11/17/2021   BUN 23 11/17/2021   BUN 23 11/04/2021   CREATININE 1.36 (H) 11/17/2021   CREATININE 1.25 (H) 11/11/2021   PROT 5.1 (L) 11/17/2021   PROT 5.4 (L) 11/04/2021   ALBUMIN 1.8 (L) 11/17/2021   ALBUMIN 3.0 (L) 11/04/2021   BILITOT 1.5 (H) 11/17/2021   BILITOT 3.2 (H) 11/11/2021   ALKPHOS 305 (H) 11/17/2021   AST 46 (H) 11/17/2021   AST 79 (H) 11/11/2021   ALT 42 11/17/2021   ALT 85 (H) 11/11/2021  .   Total Discharge time is about 33 minutes  Roxan Hockey M.D on 11/17/2021 at 2:09 PM  Go to [email or web address] -  for contact info  [facility] - Office  [phone]

## 2021-11-17 NOTE — NC FL2 (Deleted)
[place] LEVEL OF CARE SCREENING TOOL     IDENTIFICATION  Patient Name: Terry Hernandez Birthdate: 08-08-1934 Sex: male Admission Date (Current Location): 11/14/2021  [facility] and [place] Number:  [employer] and Address:  [place] [address] [place]      Provider Number: [id number]  Attending Physician Name and Address:  Roxan Hockey, MD  Relative Name and Phone Number:  AAYANSH, CODISPOTI)   [phone]    Current Level of Care: Hospital Recommended Level of Care: [place] Prior Approval Number:    Date Approved/Denied:   PASRR Number: Not able to access [place] [facility]. Will follow up with [facility] must Monday  Discharge Plan: SNF    Current Diagnoses: Patient Active Problem List   Diagnosis Date Noted   Malnutrition of moderate degree 11/17/2021   E Coli Acute lower UTI 11/15/2021   Pleural effusion 11/15/2021   Physical deconditioning 11/15/2021   Transaminitis 11/15/2021   Obstructive uropathy 11/15/2021   Lactic acidosis 11/15/2021   Failure to thrive in adult 11/15/2021   Hypoalbuminemia due to protein-calorie malnutrition ([place]) 11/15/2021   History of pancreatitis 11/15/2021   FTT (failure to thrive) in adult 11/15/2021   Generalized weakness 11/14/2021   Atrial fibrillation ([place]) 11/04/2021   Hyperbilirubinemia 10/24/2021   Common bile duct (CBD) stricture 10/24/2021   Essential hypertension    GERD (gastroesophageal reflux disease)    Guaiac positive stools    AKI (acute kidney injury) ([place])    Leukocytosis    Elevated liver enzymes    Macrocytic anemia    Hyperglycemia    Hyponatremia    Hypotension    Abnormal weight loss    Jaundice    Lumbar stenosis with neurogenic claudication 03/18/2020   Lumbar spinal stenosis 03/18/2020   PREMATURE ATRIAL CONTRACTIONS 09/04/2009   PALPITATIONS 09/04/2009   Mixed hyperlipidemia 09/03/2009    Orientation RESPIRATION BLADDER Height &  Weight     Self, Time, Situation, Place  Normal Indwelling catheter Weight: 142 lb 3.2 oz (64.5 kg) Height:  5\' 10"  (177.8 cm)  BEHAVIORAL SYMPTOMS/MOOD NEUROLOGICAL BOWEL NUTRITION STATUS      Continent Diet (Diet Heart Room service appropriate? Yes; Fluid consistency: Thin)  AMBULATORY STATUS COMMUNICATION OF NEEDS Skin   Extensive Assist Verbally Other (Comment) (Eccymosis bilateral arm and leg)                       Personal Care Assistance Level of Assistance  Bathing, Feeding, Dressing Bathing Assistance: Maximum assistance Feeding assistance: Independent Dressing Assistance: Limited assistance     Functional Limitations Info  Sight, Hearing, Speech Sight Info: Impaired Hearing Info: Adequate Speech Info: Adequate    SPECIAL CARE FACTORS FREQUENCY  PT (By licensed PT)     PT Frequency: 5x              Contractures Contractures Info: Not present    Additional Factors Info  Code Status, Allergies Code Status Info: DNR Allergies Info: Cephalexin           Current Medications (11/17/2021):  This is the current hospital active medication list Current Facility-Administered Medications  Medication Dose Route Frequency Provider Last Rate Last Admin   cefTRIAXone (ROCEPHIN) 1 g in sodium chloride 0.9 % 100 mL IVPB  1 g Intravenous Q24H Emokpae, Courage, MD 200 mL/hr at 11/17/21 1010 1 g at 11/17/21 1010   Chlorhexidine Gluconate Cloth 2 % PADS 6 each  6 each  Topical Daily Emokpae, Courage, MD   6 each at 11/17/21 1008   enoxaparin (LOVENOX) injection 40 mg  40 mg Subcutaneous Q24H Adefeso, Oladapo, DO   40 mg at 11/17/21 9201   feeding supplement (ENSURE ENLIVE / ENSURE PLUS) liquid 237 mL  237 mL Oral TID BM Emokpae, Courage, MD       insulin aspart (novoLOG) injection 0-5 Units  0-5 Units Subcutaneous QHS Adefeso, Oladapo, DO   3 Units at 11/15/21 2202   insulin aspart (novoLOG) injection 0-9 Units  0-9 Units Subcutaneous TID WC Adefeso, Oladapo, DO   3 Units  at 11/17/21 1303   metoprolol succinate (TOPROL-XL) 24 hr tablet 25 mg  25 mg Oral Daily Emokpae, Courage, MD   25 mg at 11/17/21 1005   mirtazapine (REMERON SOL-TAB) disintegrating tablet 7.5 mg  7.5 mg Oral QHS Emokpae, Courage, MD   7.5 mg at 11/16/21 2210   pantoprazole (PROTONIX) EC tablet 40 mg  40 mg Oral Daily Adefeso, Oladapo, DO   40 mg at 11/17/21 1005     Discharge Medications: Please see discharge summary for a list of discharge medications.  Relevant Imaging Results:  Relevant Lab Results:   Additional Information Pt SSN [id number]  Shade Flood, LCSW

## 2021-11-17 NOTE — Progress Notes (Signed)
   11/17/21 0831  Assess: MEWS Score  Temp 98 F (36.7 C)  BP 122/62  Pulse Rate (!) 114  Resp 17  SpO2 93 %  Assess: MEWS Score  MEWS Temp 0  MEWS Systolic 0  MEWS Pulse 2  MEWS RR 0  MEWS LOC 0  MEWS Score 2  MEWS Score Color Yellow  Assess: if the MEWS score is Yellow or Red  Were vital signs taken at a resting state? Yes  Focused Assessment No change from prior assessment  Early Detection of Sepsis Score *See Row Information* Medium  MEWS guidelines implemented *See Row Information* Yes  Take Vital Signs  Increase Vital Sign Frequency  Yellow: Q 2hr X 2 then Q 4hr X 2, if remains yellow, continue Q 4hrs  Escalate  MEWS: Escalate Yellow: discuss with charge nurse/RN and consider discussing with provider and RRT  Notify: Charge Nurse/RN  Name of Charge Nurse/RN Notified Mable Fill RN  Date Charge Nurse/RN Notified 11/17/21  Time Charge Nurse/RN Notified 4580  Notify: Provider  Provider Name/Title Dr. Denton Brick  Date Provider Notified 11/17/21  Time Provider Notified [phone]  Notification Type Page  Notification Reason Other (Comment) (yellow MEWS)

## 2021-11-17 NOTE — Progress Notes (Signed)
Physical Therapy Treatment Patient Details Name: Terry Hernandez MRN: [id number] DOB: 1934-03-14 Today's Date: 11/17/2021   History of Present Illness Terry Hernandez is a 87 y.o. male with medical history significant for paroxysmal A. fib followed by [facility] cardiology, pancreatitis, CKD stage 3, GERD, hypertension, osteoarthritis, hyperlipidemia, prediabetes, chronic back pain, left foot drop, lumbar stenosis with neurogenic claudication who presents to the emergency department accompanied by son due to worsening generalized weakness over the last few days, patient has lost about 9 pounds within last week per son at bedside.  Patient has not been able to get out of bed within last 48 hours due to weakness, he lives with elderly wife who was incapable of being able to assist in ambulation.  Diet has decreased recently and he took a whole lot of effort to be able to assist in getting out of bed today.  Patient was noted to have very little urine production today, so he presented to the ED for further evaluation and management accompanied by son.  He was recently admitted from 11/11-11/16 due to distal common bile duct stricture with transaminitis during which ERCP was done and stent was placed on 10/26/2021 and 10-day course of antibiotic therapy with Augmentin on discharge was provided.  He denies chest pain, shortness of breath, fever, chills, nausea, vomiting, abdominal pain, diarrhea or constipation    PT Comments    Patient demonstrates increased time with labored movement for sitting up at bedside with HOB flat, increased endurance/distance for gait training while on 2 LPM O2 with SpO2 dropping from 91 to 85% and limited mostly due to fatigue and SOB.  Patient tolerated sitting up in chair after therapy his SpO2 above 90% while on 2 LPM and his son present in room.  Patient will benefit from continued skilled physical therapy in hospital and recommended venue below to increase strength, balance, endurance  for safe ADLs and gait.     Recommendations for follow up therapy are one component of a multi-disciplinary discharge planning process, led by the attending physician.  Recommendations may be updated based on patient status, additional functional criteria and insurance authorization.  Follow Up Recommendations  Skilled nursing-short term rehab (<3 hours/day)     Assistance Recommended at Discharge Intermittent Supervision/Assistance  Equipment Recommendations  None recommended by PT    Recommendations for Other Services       Precautions / Restrictions Precautions Precautions: Fall Restrictions Weight Bearing Restrictions: No     Mobility  Bed Mobility Overal bed mobility: Needs Assistance Bed Mobility: Supine to Sit   Sidelying to sit: Min assist       General bed mobility comments: increased time, labored movement with HOB flat    Transfers Overall transfer level: Needs assistance Equipment used: Rolling walker (2 wheels) Transfers: Sit to/from Stand;Bed to chair/wheelchair/BSC Sit to Stand: Min assist     Step pivot transfers: Min assist     General transfer comment: slow labored movement    Ambulation/Gait Ambulation/Gait assistance: Min guard Gait Distance (Feet): 40 Feet Assistive device: Rolling walker (2 wheels) Gait Pattern/deviations: Step-through pattern;Decreased stride length;Trunk flexed Gait velocity: decreased     General Gait Details: slow labored cadence requiring increased time to make turns, occasional standing rest breaks due to SOB with SpO2 dropping from 91% to 84% while on 2 LPM O2, limited mostly due to c/o fatigue   Stairs             Wheelchair Mobility    Modified Rankin (Stroke  Patients Only)       Balance Overall balance assessment: Needs assistance Sitting-balance support: Feet supported;No upper extremity supported Sitting balance-Leahy Scale: Good Sitting balance - Comments: seated at EOB   Standing  balance support: Reliant on assistive device for balance;During functional activity;Bilateral upper extremity supported Standing balance-Leahy Scale: Fair Standing balance comment: using RW                            Cognition Arousal/Alertness: Awake/alert Behavior During Therapy: WFL for tasks assessed/performed Overall Cognitive Status: Within Functional Limits for tasks assessed                                          Exercises General Exercises - Lower Extremity Long Arc Quad: Seated;AROM;Strengthening;Both;10 reps Hip Flexion/Marching: Seated;AROM;Strengthening;Both;10 reps Toe Raises: Seated;AROM;Strengthening;Both;10 reps Heel Raises: Seated;AROM;Strengthening;Both;10 reps    General Comments        Pertinent Vitals/Pain Pain Assessment: No/denies pain    Home Living                          Prior Function            PT Goals (current goals can now be found in the care plan section) Acute Rehab PT Goals Patient Stated Goal: increase strength and be safe in mobility PT Goal Formulation: With patient Time For Goal Achievement: 11/29/21 Potential to Achieve Goals: Good Progress towards PT goals: Progressing toward goals    Frequency    Min 3X/week      PT Plan Current plan remains appropriate    Co-evaluation              AM-PAC PT "6 Clicks" Mobility   Outcome Measure  Help needed turning from your back to your side while in a flat bed without using bedrails?: A Little Help needed moving from lying on your back to sitting on the side of a flat bed without using bedrails?: A Little Help needed moving to and from a bed to a chair (including a wheelchair)?: A Little Help needed standing up from a chair using your arms (e.g., wheelchair or bedside chair)?: A Lot Help needed to walk in hospital room?: A Little Help needed climbing 3-5 steps with a railing? : A Lot 6 Click Score: 16    End of Session  Equipment Utilized During Treatment: Oxygen Activity Tolerance: Patient tolerated treatment well;Patient limited by fatigue Patient left: in chair;with call bell/phone within reach;with family/visitor present Nurse Communication: Mobility status PT Visit Diagnosis: Unsteadiness on feet (R26.81);Other abnormalities of gait and mobility (R26.89);Muscle weakness (generalized) (M62.81)     Time: 2585-2778 PT Time Calculation (min) (ACUTE ONLY): 29 min  Charges:  $Gait Training: 8-22 mins $Therapeutic Exercise: 8-22 mins                     2:41 PM, 11/17/21 Lonell Grandchild, MPT Physical Therapist with [facility] [phone] office [phone] mobile phone

## 2021-11-17 NOTE — TOC Progression Note (Signed)
Transition of Care [facility]) - Progression Note    Patient Details  Name: Terry Hernandez MRN: [id number] Date of Birth: 20-Aug-1934  [facility]) CM/SW Contact  Shade Flood, LCSW Phone Number: 11/17/2021, 4:25 PM  Clinical Narrative:     Pt stable for dc today. Auth for SNF received. Pt and son selected [facility] but now [facility] is having an issue with their computer system and cannot accept admissions until it is resolved. Discussed with pt's son who states that he may choose to take pt home with Home Health instead. Pt will likely need Home O2 as well.  Discussed with MD. Plan now is for dc home with [facility] tomorrow unless pt/family decide they want [facility] after all.  Will follow up in AM.  Expected Discharge Plan: [place] Barriers to Discharge: Continued Medical Work up  Expected Discharge Plan and Services Expected Discharge Plan: [place] arrangements for the past 2 months: Single Family Home Expected Discharge Date: 11/17/21                                     Social Determinants of Health (SDOH) Interventions    Readmission Risk Interventions No flowsheet data found.

## 2021-11-17 NOTE — NC FL2 (Signed)
[place] LEVEL OF CARE SCREENING TOOL     IDENTIFICATION  Patient Name: Terry Hernandez Birthdate: [birth year] Sex: male Admission Date (Current Location): 11/14/2021  [facility] and [place] Number:  [employer] and Address:  [place] [address], [place]      Provider Number: [id number]  Attending Physician Name and Address:  Roxan Hockey, MD  Relative Name and Phone Number:  ELAD, MACPHAIL)   [phone]    Current Level of Care: Hospital Recommended Level of Care: [place] Prior Approval Number:    Date Approved/Denied:   PASRR Number: [id number]  Discharge Plan: SNF    Current Diagnoses: Patient Active Problem List   Diagnosis Date Noted   Malnutrition of moderate degree 11/17/2021   E Coli Acute lower UTI 11/15/2021   Pleural effusion 11/15/2021   Physical deconditioning 11/15/2021   Transaminitis 11/15/2021   Obstructive uropathy 11/15/2021   Lactic acidosis 11/15/2021   Failure to thrive in adult 11/15/2021   Hypoalbuminemia due to protein-calorie malnutrition ([place]) 11/15/2021   History of pancreatitis 11/15/2021   FTT (failure to thrive) in adult 11/15/2021   Generalized weakness 11/14/2021   Atrial fibrillation ([place]) 11/04/2021   Hyperbilirubinemia 10/24/2021   Common bile duct (CBD) stricture 10/24/2021   Essential hypertension    GERD (gastroesophageal reflux disease)    Guaiac positive stools    AKI (acute kidney injury) ([place])    Leukocytosis    Elevated liver enzymes    Macrocytic anemia    Hyperglycemia    Hyponatremia    Hypotension    Abnormal weight loss    Jaundice    Lumbar stenosis with neurogenic claudication 03/18/2020   Lumbar spinal stenosis 03/18/2020   PREMATURE ATRIAL CONTRACTIONS 09/04/2009   PALPITATIONS 09/04/2009   Mixed hyperlipidemia 09/03/2009    Orientation RESPIRATION BLADDER Height & Weight     Self, Time, Situation, Place  Normal  Indwelling catheter Weight: 142 lb 3.2 oz (64.5 kg) Height:  5\' 10"  (177.8 cm)  BEHAVIORAL SYMPTOMS/MOOD NEUROLOGICAL BOWEL NUTRITION STATUS      Continent Diet (Diet Heart Room service appropriate? Yes; Fluid consistency: Thin)  AMBULATORY STATUS COMMUNICATION OF NEEDS Skin   Extensive Assist Verbally Other (Comment) (Eccymosis bilateral arm and leg)                       Personal Care Assistance Level of Assistance  Bathing, Feeding, Dressing Bathing Assistance: Maximum assistance Feeding assistance: Independent Dressing Assistance: Limited assistance     Functional Limitations Info  Sight, Hearing, Speech Sight Info: Impaired Hearing Info: Adequate Speech Info: Adequate    SPECIAL CARE FACTORS FREQUENCY  PT (By licensed PT)     PT Frequency: 5x              Contractures Contractures Info: Not present    Additional Factors Info  Code Status, Allergies Code Status Info: DNR Allergies Info: Cephalexin           Current Medications (11/17/2021):  This is the current hospital active medication list Current Facility-Administered Medications  Medication Dose Route Frequency Provider Last Rate Last Admin   cefTRIAXone (ROCEPHIN) 1 g in sodium chloride 0.9 % 100 mL IVPB  1 g Intravenous Q24H Emokpae, Courage, MD 200 mL/hr at 11/17/21 1010 1 g at 11/17/21 1010   Chlorhexidine Gluconate Cloth 2 % PADS 6 each  6 each Topical Daily Roxan Hockey, MD   6 each at 11/17/21 1008  enoxaparin (LOVENOX) injection 40 mg  40 mg Subcutaneous Q24H Adefeso, Oladapo, DO   40 mg at 11/17/21 4982   feeding supplement (ENSURE ENLIVE / ENSURE PLUS) liquid 237 mL  237 mL Oral TID BM Emokpae, Courage, MD       insulin aspart (novoLOG) injection 0-5 Units  0-5 Units Subcutaneous QHS Adefeso, Oladapo, DO   3 Units at 11/15/21 2202   insulin aspart (novoLOG) injection 0-9 Units  0-9 Units Subcutaneous TID WC Adefeso, Oladapo, DO   3 Units at 11/17/21 1303   metoprolol succinate  (TOPROL-XL) 24 hr tablet 25 mg  25 mg Oral Daily Emokpae, Courage, MD   25 mg at 11/17/21 1005   mirtazapine (REMERON SOL-TAB) disintegrating tablet 7.5 mg  7.5 mg Oral QHS Emokpae, Courage, MD   7.5 mg at 11/16/21 2210   pantoprazole (PROTONIX) EC tablet 40 mg  40 mg Oral Daily Adefeso, Oladapo, DO   40 mg at 11/17/21 1005     Discharge Medications: Please see discharge summary for a list of discharge medications.  Relevant Imaging Results:  Relevant Lab Results:   Additional Information Pt SSN [id number]  Shade Flood, LCSW

## 2021-11-17 NOTE — Progress Notes (Signed)
Initial Nutrition Assessment  DOCUMENTATION CODES:   Non-severe (moderate) malnutrition in context of acute illness/injury  INTERVENTION:  Discontinue- Glucerna Shake po TID, each supplement provides 220 kcal and 10 grams of protein   Provide Ensure Enlive TID  Recommend liberalize diet to regular  NUTRITION DIAGNOSIS:   Moderate Malnutrition related to acute illness as evidenced by mild fat depletion, moderate fat depletion, mild muscle depletion, moderate muscle depletion, percent weight loss.   GOAL:  Patient will meet greater than or equal to 90% of their needs  MONITOR:  PO intake, Supplement acceptance, Labs, Weight trends  REASON FOR ASSESSMENT:   Consult Assessment of nutrition requirement/status  ASSESSMENT: Patient is a 87 yo male with hx of pancreatitis, GERD, HTN, CKD-3, FTT. Presents with altered mental status, weakness, possible UTI.   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  Palliative consult pending. Patient hospitalized at [facility] in November- CBD obstruction.  Patient family bedside. He lives with his wife. Patient ate 100% of bacon sandwich a family member brought him this morning and he has been consuming Glucerna shakes TID. Patient says, I have not been hungry lately but has been consistently consuming ONS (Boost) at home. Recommend change current ONS to a higher protein, high calorie option due to his limited intake.   Patient weights reviewed. 11/10- 149 lb per chart and 144 lb on 11/13. Current wt 141.9 lb- reflects a loss of ~7 lb/ 4.8% in < 1 month which is significant.   Medications include: remeron (appetite), insulin, protonix.  Drips- Rocephin   Labs: BMP Latest Ref Rng & Units 11/17/2021 11/15/2021 11/14/2021  Glucose 70 - 99 mg/dL 167(H) 233(H) 306(H)  BUN 8 - 23 mg/dL 23 28(H) 29(H)  Creatinine 0.61 - 1.24 mg/dL 1.36(H) 1.35(H) 1.51(H)   BUN/Creat Ratio 10 - 24 - - -  Sodium 135 - 145 mmol/L 136 139 136  Potassium 3.5 - 5.1 mmol/L 3.4(L) 4.1 4.1  Chloride 98 - 111 mmol/L 106 107 100  CO2 22 - 32 mmol/L 25 26 25   Calcium 8.9 - 10.3 mg/dL 7.7(L) 7.8(L) 8.2(L)      NUTRITION - FOCUSED PHYSICAL EXAM:  Flowsheet Row Most Recent Value  Orbital Region Mild depletion  Upper Arm Region Moderate depletion  Thoracic and Lumbar Region Mild depletion  Buccal Region Mild depletion  Temple Region Mild depletion  Clavicle Bone Region Mild depletion  Clavicle and Acromion Bone Region Moderate depletion  Scapular Bone Region Unable to assess  Dorsal Hand Mild depletion  Patellar Region No depletion  Anterior Thigh Region No depletion  Posterior Calf Region No depletion  Edema (RD Assessment) None  Hair Reviewed  [balding]  Eyes Reviewed  Mouth Reviewed  [missing teeth]  Skin Reviewed  Nails Reviewed       Diet Order:   Diet Order             Diet Heart Room service appropriate? Yes; Fluid consistency: Thin  Diet effective now                   EDUCATION NEEDS:  Education needs have been addressed  Skin:  Skin Assessment: Reviewed RN Assessment  Last BM:  12/3  Height:   Ht Readings from Last 1 Encounters:  11/15/21 5\' 10"  (1.778 m)    Weight:   Wt Readings from Last 1 Encounters:  11/15/21 64.5 kg    Ideal Body Weight:   75 kg  BMI:  Body mass index is 20.4 kg/m.  Estimated Nutritional Needs:   Kcal:  1900-2000  Protein:  85-90 gr  Fluid:  2 liters daily  Colman Cater MS,RD,CSG,LDN Contact: Shea Evans

## 2021-11-17 NOTE — Progress Notes (Signed)
Advised by Dr. Denton Brick to change foley catheter prior to discharge due to admission for UTI. Foley changed, IV removed for discharge. Son at bedside and aware. Call bell within reach.

## 2021-11-17 NOTE — Discharge Instructions (Signed)
1)Please repeat CBC and CMP blood work in about 1 week from now 2)Please follow-up with your oncologist Dr. Benay Spice as previously scheduled after  CT scan 3)Your  CT abdomen and pelvis-test is  scheduled for  November 24, 2021 4)Please follow-up with Urologist Dr. Alyson Ingles in about --- for possible removal of the Foley catheter-- and  recheck and follow-up evaluation  in his office----[facility], [address], [address], [place] [address] Phone Number----[phone]

## 2021-11-18 LAB — GLUCOSE, CAPILLARY
Glucose-Capillary: 181 mg/dL — ABNORMAL HIGH (ref 70–99)
Glucose-Capillary: 211 mg/dL — ABNORMAL HIGH (ref 70–99)

## 2021-11-18 MED ORDER — LORAZEPAM 1 MG PO TABS: 1.0000 mg | ORAL_TABLET | Freq: Two times a day (BID) | ORAL | 0 refills | Status: DC | PRN

## 2021-11-18 MED ORDER — PHENAZOPYRIDINE HCL 200 MG PO TABS: 200.0000 mg | ORAL_TABLET | Freq: Three times a day (TID) | ORAL | 0 refills | Status: DC | PRN

## 2021-11-18 NOTE — TOC Transition Note (Signed)
Transition of Care [facility]) - CM/SW Discharge Note   Patient Details  Name: AUBRA PAPPALARDO MRN: [id number] Date of Birth: November 22, 1934  Transition of Care [facility] CM/SW Contact:  Shade Flood, LCSW Phone Number: 11/18/2021, 1:40 PM   Clinical Narrative:     TOC following. Pt remains stable for dc today per MD. Damaris Schooner with pt and son at bedside and they confirm that they would like pt to return home with Home Health rather than go to SNF. Per MD, pt will require home O2 for dc. Discussed CMS provider options for [facility] and DME with pt and son and referred as requested. [facility] will follow pt at home. [employer] will provide O2.  Pt can dc once portable O2 delivered to the hospital room. Updated RN.  There are no other TOC needs for dc.  Final next level of care: [place] Barriers to Discharge: Barriers Resolved   Patient Goals and CMS Choice Patient states their goals for this hospitalization and ongoing recovery are:: Rehab with SNF CMS Medicare.gov Compare Post Acute Care list provided to:: Patient Represenative (must comment) Choice offered to / list presented to : Adult Children  Discharge Placement                       Discharge Plan and Services In-house Referral: Clinical Social Work   Post Acute Care Choice: Home Health          DME Arranged: Oxygen DME Agency: [employer] Date DME Agency Contacted: 11/18/21   Representative spoke with at DME Agency: Caryl Pina HH Arranged: RN, PT [facility] Agency: [place] Date [place]: 11/18/21   Representative spoke with at [place]: Judson Roch  Social Determinants of Health ([facility]) Interventions     Readmission Risk Interventions No flowsheet data found.

## 2021-11-18 NOTE — Progress Notes (Signed)
PROGRESS NOTE     Terry Hernandez, is a 87 y.o. male, DOB - [birth year], ZCH:[id number]  Admit date - 11/14/2021   Admitting Physician Tafari Humiston Denton Brick, MD  Outpatient Primary MD for the patient is [name], FNP  LOS - 3  Chief Complaint  Patient presents with   Altered Mental Status        Brief Narrative:  87 y.o. male with medical history significant for paroxysmal A. fib followed by [facility] cardiology, pancreatitis, CKD stage 3, GERD, hypertension, osteoarthritis, hyperlipidemia, prediabetes, chronic back pain, left foot drop, lumbar stenosis with neurogenic claudication admitted on 11/14/2021 with failure to thrive, possible UTI, and  worsening generalized weakness and deconditioning  -Disposition-patient was discharged to SNF facility on 11/17/2021, however SNF facility was unable to pick patient up -On 11/18/2021 patient and son requested discharge home with home health services instead of discharge to SNF facility -So patient is now discharged  Home with [facility] and Home oxygen  Assessment & Plan:   Principal Problem:   E Coli Acute lower UTI Active Problems:   Failure to thrive in adult   Hypoalbuminemia due to protein-calorie malnutrition ([place])   Essential hypertension   Atrial fibrillation (HCC)   Mixed hyperlipidemia   GERD (gastroesophageal reflux disease)   Macrocytic anemia   Hyperglycemia   Generalized weakness   Pleural effusion   Physical deconditioning   Transaminitis   Obstructive uropathy   Lactic acidosis   History of pancreatitis   FTT (failure to thrive) in adult   Malnutrition of moderate degree   1)E coli CAUTI--- treated with IV Rocephin, Foley was changed, okay to discharge on p.o. 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 -- as per  Patient's oncologist there is no clinical or radiologic evidence of distant metastatic  disease. ---Plastic stent placed to the distal CBD on 10/26/2021, replaced stent on 10/30/2021 with uncovered metal stent --LFTs are trending down -Patient sees gastroenterologist Dr. Watt Climes and oncologist Dr. Benay Spice  3) pleural effusion and dyspnea on exertion--- -Echo with preserved EF of 65 to 70% -Consider therapeutic thoracentesis if clinically worsens  -Doing well on 2 to 3 L of oxygen via nasal cannula at this time  4)AKI----acute kidney injury -suspect dehydration related -Creatinine on admission 1.51  creatinine was 1.0 on 11/04/2021-    -Creatinine currently down to 1.36 with hydration   5) obstructive uropathy/BPH with Luts--- continue Foley catheter in situ  6)  Spinal stenosis-status post lumbar laminectomy April 2021/generalized weakness--- Home health physical therapy advised  7) FTT-- with  Dehydration - -Remeron for appetite stimulation -Nutritional supplements advised  8)PAF--history of paroxysmal of A. Fib -Has outpatient appointment with cardiology on 12/16/2021 -Not a candidate for anticoagulation given anemia requiring transfusion and underlying malignancy that may require interventions  9)Macrocytic Anemia--- folate and B12 not low Hgb--currently close to baseline around 10-  10)GERD--continue PPI especially given concerns for ongoing risk for GI bleed  11) social/ethics--- plan of care and advanced directives discussed with patient and son, they request DNR status -No limitations to treatment at this time  12) acute hypoxic respiratory failure--multifactorial etiology including pleural effusion and underlying COPD  Patient needs continuous O2 at 3 L/min continuously via nasal cannula with humidifier, with gaseous portability and conserving device    Disposition-patient was discharged to SNF facility on 11/17/2021, however SNF facility was unable to pick patient up -On 11/18/2021 patient and son requested discharge  home with home health services instead of  discharge to SNF facility -So patient is now discharged  Home with [facility] and Home oxygen -Discharge instructions as below:- 1)Please repeat CBC and CMP blood work in about 1 week from now 2)Please follow-up with your oncologist Dr. Benay Spice as previously scheduled after  CT scan 3)Your  CT abdomen and pelvis-test is  scheduled for  November 24, 2021 4)Please follow-up with Urologist Dr. Alyson Ingles in about --- for possible removal of the Foley catheter-- and  recheck and follow-up evaluation  in his office----[facility], [address], [place] [address] Phone Number----[phone]    Code Status :  -  Code Status: DNR   Family Communication:    (patient is alert, awake and coherent)  Discussed with son at bedside  Consults  :  na  DVT Prophylaxis  :   - SCDs  enoxaparin (LOVENOX) injection 40 mg Start: 11/15/21 0600 SCDs Start: 11/15/21 0056    Lab Results  Component Value Date   PLT 342 11/17/2021    Inpatient Medications  Scheduled Meds:  Chlorhexidine Gluconate Cloth  6 each Topical Daily   enoxaparin (LOVENOX) injection  40 mg Subcutaneous Q24H   feeding supplement  237 mL Oral TID BM   insulin aspart  0-5 Units Subcutaneous QHS   insulin aspart  0-9 Units Subcutaneous TID WC   metoprolol succinate  25 mg Oral Daily   mirtazapine  7.5 mg Oral QHS   pantoprazole  40 mg Oral Daily   Continuous Infusions:  cefTRIAXone (ROCEPHIN)  IV Stopped (11/17/21 1040)   PRN Meds:.   Anti-infectives (From admission, onward)    Start     Dose/Rate Route Frequency Ordered Stop   11/17/21 0000  ciprofloxacin (CIPRO) 500 MG tablet        500 mg Oral 2 times daily 11/17/21 1341 11/20/21 2359   11/16/21 1000  cefTRIAXone (ROCEPHIN) 1 g in sodium chloride 0.9 % 100 mL IVPB        1 g 200 mL/hr over 30 Minutes Intravenous Every 24 hours 11/15/21 1633     11/15/21 1300  aztreonam (AZACTAM) 1 g in sodium chloride 0.9 % 100 mL IVPB  Status:  Discontinued         1 g 200 mL/hr over 30 Minutes Intravenous Every 8 hours 11/15/21 1057 11/15/21 1633   11/15/21 1245  cefTRIAXone (ROCEPHIN) 1 g in sodium chloride 0.9 % 100 mL IVPB        1 g 200 mL/hr over 30 Minutes Intravenous  Once 11/15/21 1146 11/15/21 1346         Subjective: [name] today has no fevers, no emesis,  No chest pain,    Disposition-patient was discharged to SNF facility on 11/17/2021, however SNF facility was unable to pick patient up -On 11/18/2021 patient and son requested discharge home with home health services instead of discharge to SNF facility -So patient is now discharged  Home with [facility] and Home oxygen   Objective: Vitals:   11/17/21 2151 11/18/21 0458 11/18/21 0600 11/18/21 1432  BP: 98/62 (!) 103/54  (!) 107/57  Pulse: (!) 56 (!) 108  (!) 120  Resp: 17 16  20   Temp: 98.7 F (37.1 C) 98.4 F (36.9 C)  98.3 F (36.8 C)  TempSrc: Oral Oral    SpO2: 94% 90%  98%  Weight:   64.3 kg   Height:        Intake/Output Summary (Last 24  hours) at 11/18/2021 1456 Last data filed at 11/18/2021 0900 Gross per 24 hour  Intake 420 ml  Output 600 ml  Net -180 ml   Filed Weights   11/15/21 0300 11/18/21 0600  Weight: 64.5 kg 64.3 kg    Physical Exam  Gen:- Awake Alert, chronically ill-appearing HEENT:- [place].AT, No sclera icterus Nose- [place] 3L/min Neck-Supple Neck,No JVD,.  Lungs-improved air movement, no wheezing  CV- S1, S2 normal, regular  Abd-  +ve B.Sounds, Abd Soft, No tenderness,    Extremity/Skin:- No  edema, pedal pulses present  Psych-affect is appropriate, oriented x3 Neuro-generalized weakness, no new focal deficits, no tremors GU- Foley in situ-changed on 11/16/2021  Data Reviewed: I have personally reviewed following labs and imaging studies  CBC: Recent Labs  Lab 11/14/21 1800 11/15/21 0446 11/17/21 0507  WBC 9.1 8.3 12.2*  NEUTROABS 5.4  --   --   HGB 12.1* 10.2* 9.8*  HCT 37.6* 32.8* 31.2*  MCV 107.4* 108.6* 105.8*  PLT 400 273 790    Basic Metabolic Panel: Recent Labs  Lab 11/14/21 1800 11/15/21 0446 11/17/21 0507  NA 136 139 136  K 4.1 4.1 3.4*  CL 100 107 106  CO2 25 26 25   GLUCOSE 306* 233* 167*  BUN 29* 28* 23  CREATININE 1.51* 1.35* 1.36*  CALCIUM 8.2* 7.8* 7.7*  MG  --  1.7  --   PHOS  --  2.9  --    GFR: Estimated Creatinine Clearance: 36.1 mL/min (A) (by C-G formula based on SCr of 1.36 mg/dL (H)). Liver Function Tests: Recent Labs  Lab 11/14/21 1800 11/15/21 0446 11/16/21 0559 11/17/21 0507  AST 61* 49* 46* 46*  ALT 65* 52* 46* 42  ALKPHOS 405* 325* 315* 305*  BILITOT 2.6* 2.1* 1.6* 1.5*  PROT 6.3* 5.0* 5.0* 5.1*  ALBUMIN 2.2* 1.9* 1.8* 1.8*   Recent Labs  Lab 11/14/21 1800  LIPASE 57*   No results for input(s): AMMONIA in the last 168 hours. Coagulation Profile: No results for input(s): INR, PROTIME in the last 168 hours. Cardiac Enzymes: No results for input(s): CKTOTAL, CKMB, CKMBINDEX, TROPONINI in the last 168 hours. BNP (last 3 results) No results for input(s): PROBNP in the last 8760 hours. HbA1C: No results for input(s): HGBA1C in the last 72 hours. CBG: Recent Labs  Lab 11/17/21 1108 11/17/21 1610 11/17/21 2201 11/18/21 0737 11/18/21 1127  GLUCAP 212* 246* 211* 181* 211*   Lipid Profile: No results for input(s): CHOL, HDL, LDLCALC, TRIG, CHOLHDL, LDLDIRECT in the last 72 hours. Thyroid Function Tests: No results for input(s): TSH, T4TOTAL, FREET4, T3FREE, THYROIDAB in the last 72 hours. Anemia Panel: No results for input(s): VITAMINB12, FOLATE, FERRITIN, TIBC, IRON, RETICCTPCT in the last 72 hours.  Urine analysis:    Component Value Date/Time   COLORURINE YELLOW 11/15/2021 0110   APPEARANCEUR CLEAR 11/15/2021 0110   APPEARANCEUR Clear 05/15/2021 1427   LABSPEC >1.030 (H) 11/15/2021 0110   PHURINE 5.0 11/15/2021 0110   GLUCOSEU >=500 (A) 11/15/2021 0110   HGBUR LARGE (A) 11/15/2021 0110   BILIRUBINUR NEGATIVE 11/15/2021 0110   BILIRUBINUR Negative  05/15/2021 1427   KETONESUR NEGATIVE 11/15/2021 0110   PROTEINUR 30 (A) 11/15/2021 0110   UROBILINOGEN 0.2 05/31/2020 1212   NITRITE POSITIVE (A) 11/15/2021 0110   LEUKOCYTESUR NEGATIVE 11/15/2021 0110   Sepsis Labs: @LABRCNTIP (procalcitonin:4,lacticidven:4)  ) Recent Results (from the past 240 hour(s))  Resp Panel by RT-PCR (Flu A&B, Covid) Nasopharyngeal Swab     Status: None   Collection  Time: 11/14/21 10:31 PM   Specimen: Nasopharyngeal Swab; Nasopharyngeal(NP) swabs in vial transport medium  Result Value Ref Range Status   SARS Coronavirus 2 by RT PCR NEGATIVE NEGATIVE Final    Comment: (NOTE) SARS-CoV-2 target nucleic acids are NOT DETECTED.  The SARS-CoV-2 RNA is generally detectable in upper respiratory specimens during the acute phase of infection. The lowest concentration of SARS-CoV-2 viral copies this assay can detect is 138 copies/mL. A negative result does not preclude SARS-Cov-2 infection and should not be used as the sole basis for treatment or other patient management decisions. A negative result may occur with  improper specimen collection/handling, submission of specimen other than nasopharyngeal swab, presence of viral mutation(s) within the areas targeted by this assay, and inadequate number of viral copies(<138 copies/mL). A negative result must be combined with clinical observations, patient history, and epidemiological information. The expected result is Negative.  Fact Sheet for Patients:  EntrepreneurPulse.com.au  Fact Sheet for Healthcare Providers:  IncredibleEmployment.be  This test is no t yet approved or cleared by the [place] FDA and  has been authorized for detection and/or diagnosis of SARS-CoV-2 by FDA under an Emergency Use Authorization (EUA). This EUA will remain  in effect (meaning this test can be used) for the duration of the COVID-19 declaration under Section 564(b)(1) of the Act,  21 U.S.C.section 360bbb-3(b)(1), unless the authorization is terminated  or revoked sooner.       Influenza A by PCR NEGATIVE NEGATIVE Final   Influenza B by PCR NEGATIVE NEGATIVE Final    Comment: (NOTE) The Xpert Xpress SARS-CoV-2/FLU/RSV plus assay is intended as an aid in the diagnosis of influenza from Nasopharyngeal swab specimens and should not be used as a sole basis for treatment. Nasal washings and aspirates are unacceptable for Xpert Xpress SARS-CoV-2/FLU/RSV testing.  Fact Sheet for Patients: EntrepreneurPulse.com.au  Fact Sheet for Healthcare Providers: IncredibleEmployment.be  This test is not yet approved or cleared by the [place] FDA and has been authorized for detection and/or diagnosis of SARS-CoV-2 by FDA under an Emergency Use Authorization (EUA). This EUA will remain in effect (meaning this test can be used) for the duration of the COVID-19 declaration under Section 564(b)(1) of the Act, 21 U.S.C. section 360bbb-3(b)(1), unless the authorization is terminated or revoked.  Performed at [facility], [address] [place], [place] [address]   Urine Culture     Status: Abnormal   Collection Time: 11/15/21  7:17 AM   Specimen: Urine, Catheterized  Result Value Ref Range Status   Specimen Description   Final    URINE, CATHETERIZED Performed at [facility], [address] [place], [place] [address]    Special Requests   Final    NONE Performed at [facility], [address], [place], [place] [address]    Culture >=100,000 COLONIES/mL ESCHERICHIA COLI (A)  Final   Report Status 11/17/2021 FINAL  Final   Organism ID, Bacteria ESCHERICHIA COLI (A)  Final      Susceptibility   Escherichia coli - MIC*    AMPICILLIN <=2 SENSITIVE Sensitive     CEFAZOLIN <=4 SENSITIVE Sensitive     CEFEPIME <=0.12 SENSITIVE Sensitive     CEFTRIAXONE <=0.25 SENSITIVE Sensitive     CIPROFLOXACIN <=0.25 SENSITIVE Sensitive      GENTAMICIN <=1 SENSITIVE Sensitive     IMIPENEM 1 SENSITIVE Sensitive     NITROFURANTOIN <=16 SENSITIVE Sensitive     TRIMETH/SULFA <=20 SENSITIVE Sensitive     AMPICILLIN/SULBACTAM <=2 SENSITIVE Sensitive  PIP/TAZO <=4 SENSITIVE Sensitive     * >=100,000 COLONIES/mL ESCHERICHIA COLI      Radiology Studies: No results found.   Scheduled Meds:  Chlorhexidine Gluconate Cloth  6 each Topical Daily   enoxaparin (LOVENOX) injection  40 mg Subcutaneous Q24H   feeding supplement  237 mL Oral TID BM   insulin aspart  0-5 Units Subcutaneous QHS   insulin aspart  0-9 Units Subcutaneous TID WC   metoprolol succinate  25 mg Oral Daily   mirtazapine  7.5 mg Oral QHS   pantoprazole  40 mg Oral Daily   Continuous Infusions:  cefTRIAXone (ROCEPHIN)  IV Stopped (11/17/21 1040)     LOS: 3 days    Roxan Hockey M.D on 11/18/2021 at 2:56 PM  Go to [email or web address] - for contact info  [facility] - Office  [phone]  If 7PM-7AM, please contact night-coverage [email or web address] Password [email or web address] 11/18/2021, 2:56 PM

## 2021-11-18 NOTE — Evaluation (Signed)
Occupational Therapy Evaluation Patient Details Name: Terry Hernandez MRN: [id number] DOB: 09-05-1934 Today's Date: 11/18/2021   History of Present Illness Terry Hernandez is a 87 y.o. male with medical history significant for paroxysmal A. fib followed by [facility] cardiology, pancreatitis, CKD stage 3, GERD, hypertension, osteoarthritis, hyperlipidemia, prediabetes, chronic back pain, left foot drop, lumbar stenosis with neurogenic claudication who presents to the emergency department accompanied by son due to worsening generalized weakness over the last few days, patient has lost about 9 pounds within last week per son at bedside.  Patient has not been able to get out of bed within last 48 hours due to weakness, he lives with elderly wife who was incapable of being able to assist in ambulation.  Diet has decreased recently and he took a whole lot of effort to be able to assist in getting out of bed today.  Patient was noted to have very little urine production today, so he presented to the ED for further evaluation and management accompanied by son.  He was recently admitted from 11/11-11/16 due to distal common bile duct stricture with transaminitis during which ERCP was done and stent was placed on 10/26/2021 and 10-day course of antibiotic therapy with Augmentin on discharge was provided.  He denies chest pain, shortness of breath, fever, chills, nausea, vomiting, abdominal pain, diarrhea or constipation   Clinical Impression   Pt agreeable to OT evaluation. Pt's family reporting that 24/7 assist can be arranged at home with someone other that pt's wife to assist with transfers. Pt needing min A this date for bed mobility and transfer to chair from EOB using RW. Pt demonstrates fair balance with RW. Pt assisted at baseline with dressing. Pt also required assist for lower body dressing this date. Pt taken off supplemental O2 initially but SpO2 decreased to 84" when sitting at EOB. Pt placed back on 2L O2 via  nasal cannula for the rest of the session. Pt left in chair with family present and call bell within reach. Pt will benefit from continued OT in the hospital and recommended venue below to increase strength, balance, and endurance for safe ADL's.        Recommendations for follow up therapy are one component of a multi-disciplinary discharge planning process, led by the attending physician.  Recommendations may be updated based on patient status, additional functional criteria and insurance authorization.   Follow Up Recommendations  Home health OT    Assistance Recommended at Discharge Frequent or constant Supervision/Assistance; assist with transfers  Functional Status Assessment  Patient has had a recent decline in their functional status and demonstrates the ability to make significant improvements in function in a reasonable and predictable amount of time.  Equipment Recommendations  None recommended by OT    Recommendations for Other Services       Precautions / Restrictions Precautions Precautions: Fall Restrictions Weight Bearing Restrictions: No      Mobility Bed Mobility Overal bed mobility: Needs Assistance Bed Mobility: Supine to Sit     Supine to sit: Min assist     General bed mobility comments: increased time, labored movement with HOB flat    Transfers Overall transfer level: Needs assistance Equipment used: Rolling walker (2 wheels) Transfers: Sit to/from Stand;Bed to chair/wheelchair/BSC Sit to Stand: Min assist     Step pivot transfers: Min assist     General transfer comment: slow labored movement      Balance Overall balance assessment: Needs assistance Sitting-balance support: Feet supported;No upper  extremity supported Sitting balance-Leahy Scale: Good Sitting balance - Comments: seated at EOB   Standing balance support: Reliant on assistive device for balance;During functional activity;Bilateral upper extremity supported Standing  balance-Leahy Scale: Fair Standing balance comment: using RW                           ADL either performed or assessed with clinical judgement   ADL Overall ADL's : Needs assistance/impaired                     Lower Body Dressing: Maximal assistance;Sitting/lateral leans Lower Body Dressing Details (indicate cue type and reason): Pt attemtped doffing sock but was unable. Level of max A for lower body dressing per clincial judgment.             Functional mobility during ADLs: Minimal assistance;Rolling walker (2 wheels) (for stand pivot transfers.) General ADL Comments: Pt requires assist but was also assisted at baseline for dressing with assist for bathing transer to shower.     Vision Baseline Vision/History: 1 Wears glasses Ability to See in Adequate Light: 1 Impaired Patient Visual Report: No change from baseline Vision Assessment?: No apparent visual deficits     Perception     Praxis      Pertinent Vitals/Pain Pain Assessment: No/denies pain     Hand Dominance Right   Extremity/Trunk Assessment Upper Extremity Assessment Upper Extremity Assessment: RUE deficits/detail;LUE deficits/detail RUE Deficits / Details: 3-/5 R shoulder flexion. P/ROM ~75% of available range for flexion with WFL abduction. Pt reports previous shoulder injuries. RUE Coordination: decreased fine motor (Decreased fine motor at baseline.) LUE Deficits / Details: 2-/5 MMT shoulder. P/rom limted to `75% available range for flexion. WFL abduction P/ROM. Decreased fine motor coordination at baseline. LUE Coordination: decreased fine motor   Lower Extremity Assessment Lower Extremity Assessment: Defer to PT evaluation   Cervical / Trunk Assessment Cervical / Trunk Assessment: Normal   Communication Communication Communication: No difficulties   Cognition Arousal/Alertness: Awake/alert Behavior During Therapy: WFL for tasks assessed/performed Overall Cognitive Status:  Within Functional Limits for tasks assessed                                                        Home Living Family/patient expects to be discharged to:: Private residence Living Arrangements: Spouse/significant other Available Help at Discharge: Family;Available 24 hours/day Type of Home: House Home Access: Stairs to enter [employer] of Steps: 2 Entrance Stairs-Rails: Right Home Layout: One level     Bathroom Shower/Tub: Occupational psychologist: Handicapped height Bathroom Accessibility: Yes   Home Equipment: Conservation officer, nature (2 wheels);Rollator (4 wheels);BSC/3in1;Crutches;Shower seat   Additional Comments: Pt's family reports that pt can have assist for mobility from smoeone other than wife and 24/7 assist.      Prior Functioning/Environment Prior Level of Function : Needs assist             Mobility Comments: uses crutches/RW at baseline ADLs Comments: assist in IADLs, driving, groceries, etc.; Assisted for transfer to shower but pt able to bath without assist and assisted with dressing at baseline.        OT Problem List: Decreased strength;Decreased activity tolerance;Impaired balance (sitting and/or standing);Impaired UE functional use      OT Treatment/Interventions: Self-care/ADL  training;Therapeutic exercise;Therapeutic activities;Balance training;Patient/family education    OT Goals(Current goals can be found in the care plan section) Acute Rehab OT Goals Patient Stated Goal: return home OT Goal Formulation: With patient Time For Goal Achievement: 12/02/21 Potential to Achieve Goals: Fair  OT Frequency: Min 2X/week    End of Session Equipment Utilized During Treatment: Rolling walker (2 wheels);Oxygen (2 L O2 via nasal cannula) Nurse Communication: Other (comment) (Notified pt in chair with family present.)  Activity Tolerance: Patient tolerated treatment well Patient left: in chair;with call bell/phone  within reach;with family/visitor present  OT Visit Diagnosis: Unsteadiness on feet (R26.81);Muscle weakness (generalized) (M62.81)                Time: 5831-6742 OT Time Calculation (min): 31 min Charges:  OT General Charges $OT Visit: 1 Visit OT Evaluation $OT Eval Low Complexity: 1 Low  Eudora Guevarra OT, MOT  Larey Seat 11/18/2021, 9:54 AM

## 2021-11-18 NOTE — Progress Notes (Signed)
     SATURATION QUALIFICATIONS: (This note is used to comply with regulatory documentation for home oxygen)   Patient Saturations on Room Air at Rest = 84 %   Patient Saturations on Room Air while Ambulating = 82 %   Patient Saturations on 3 Liters of oxygen while Ambulating = 91 %     Patient needs continuous O2 at 3 L/min continuously via nasal cannula with humidifier, with gaseous portability and conserving device    Dx--COPD  Roxan Hockey, MD

## 2021-11-18 NOTE — Progress Notes (Signed)
Discharge instructions provided to both patient and son who is at bedside. Son states he received call from [employer] stating they will be delivering O2 to hospital and set up at home. Son would like to speak to Dr. Denton Brick. Otherwise no further questions. Dr. Denton Brick notified.

## 2021-11-18 NOTE — Plan of Care (Signed)
  Problem: Acute Rehab OT Goals (only OT should resolve) Goal: Pt. Will Perform Grooming Flowsheets (Taken 11/18/2021 0957) Pt Will Perform Grooming:  standing  with min guard assist  with supervision  with adaptive equipment Goal: Pt. Will Perform Lower Body Bathing Flowsheets (Taken 11/18/2021 0957) Pt Will Perform Lower Body Bathing:  with adaptive equipment  with supervision  sitting/lateral leans Goal: Pt. Will Perform Lower Body Dressing Flowsheets (Taken 11/18/2021 0957) Pt Will Perform Lower Body Dressing:  with supervision  with min guard assist  sitting/lateral leans  with adaptive equipment Goal: Pt. Will Transfer To Toilet [place] (Taken 11/18/2021 0957) Pt Will Transfer to Toilet:  with supervision  stand pivot transfer  bedside commode Goal: Pt/Caregiver Will Perform Home Exercise Program Flowsheets (Taken 11/18/2021 0957) Pt/caregiver will Perform Home Exercise Program:  Increased ROM  Increased strength  Right Upper extremity  Left upper extremity  With minimal assist  Jailey Booton OT, MOT

## 2021-11-18 NOTE — Progress Notes (Signed)
SATURATION QUALIFICATIONS: (This note is used to comply with regulatory documentation for home oxygen)  Patient Saturations on Room Air at Rest = 84%  Please briefly explain why patient needs home oxygen: decreased O2 saturation at rest

## 2021-11-18 NOTE — Progress Notes (Signed)
   11/18/21 1432  Assess: MEWS Score  Temp 98.3 F (36.8 C)  BP (!) 107/57  Pulse Rate (!) 120  Resp 20  SpO2 98 %  O2 Device Nasal Cannula  O2 Flow Rate (L/min) 2 L/min  Assess: MEWS Score  MEWS Temp 0  MEWS Systolic 0  MEWS Pulse 2  MEWS RR 0  MEWS LOC 0  MEWS Score 2  MEWS Score Color Yellow  Assess: if the MEWS score is Yellow or Red  Were vital signs taken at a resting state? Yes  Focused Assessment No change from prior assessment  Early Detection of Sepsis Score *See Row Information* Low  MEWS guidelines implemented *See Row Information* Yes  Take Vital Signs  Increase Vital Sign Frequency  Yellow: Q 2hr X 2 then Q 4hr X 2, if remains yellow, continue Q 4hrs  Escalate  MEWS: Escalate Yellow: discuss with charge nurse/RN and consider discussing with provider and RRT  Notify: Charge Nurse/RN  Name of Charge Nurse/RN Notified Mable Fill RN  Date Charge Nurse/RN Notified 11/18/21  Time Charge Nurse/RN Notified 1435  Notify: Provider  Provider Name/Title Dr. Denton Brick  Date Provider Notified 11/18/21  Time Provider Notified 1436  Notification Type Page  Notification Reason Other (Comment) (yellow MEWS)

## 2021-11-19 ENCOUNTER — Encounter: Payer: Self-pay | Admitting: *Deleted

## 2021-11-19 DIAGNOSIS — I480 Paroxysmal atrial fibrillation: Secondary | ICD-10-CM | POA: Diagnosis not present

## 2021-11-19 DIAGNOSIS — C221 Intrahepatic bile duct carcinoma: Secondary | ICD-10-CM | POA: Diagnosis not present

## 2021-11-19 DIAGNOSIS — E440 Moderate protein-calorie malnutrition: Secondary | ICD-10-CM | POA: Diagnosis not present

## 2021-11-19 DIAGNOSIS — N390 Urinary tract infection, site not specified: Secondary | ICD-10-CM | POA: Diagnosis not present

## 2021-11-19 DIAGNOSIS — I129 Hypertensive chronic kidney disease with stage 1 through stage 4 chronic kidney disease, or unspecified chronic kidney disease: Secondary | ICD-10-CM | POA: Diagnosis not present

## 2021-11-19 DIAGNOSIS — J90 Pleural effusion, not elsewhere classified: Secondary | ICD-10-CM | POA: Diagnosis not present

## 2021-11-19 DIAGNOSIS — K831 Obstruction of bile duct: Secondary | ICD-10-CM | POA: Diagnosis not present

## 2021-11-19 NOTE — Progress Notes (Signed)
PATIENT NAVIGATOR PROGRESS NOTE  Name: Terry Hernandez Date: 11/19/2021 MRN: [id number]  DOB: [birth year]   Reason for visit:  Phone call with son, management and update  Comments:  Son [name] called to update after fathers admission to [facility] with UTI. He was admitted to [facility] with UTI infection, patient has foley catheter(scheduled to see [name] next week), he was started on antibiotics and Oxygen therapy. He was D/C home with oxygen, home health, and foley catheter replaced. He continues on antibiotics. Son is primary caregiver and needs FMLA paperwork filed. Gave him fax number to fax paperwork. Pt returns to clinic on 12/13 and scheduled for MRI 12/12    Time spent counseling/coordinating care: 15-30 minutes

## 2021-11-21 ENCOUNTER — Telehealth: Payer: Self-pay

## 2021-11-21 DIAGNOSIS — N390 Urinary tract infection, site not specified: Secondary | ICD-10-CM | POA: Diagnosis not present

## 2021-11-21 DIAGNOSIS — I129 Hypertensive chronic kidney disease with stage 1 through stage 4 chronic kidney disease, or unspecified chronic kidney disease: Secondary | ICD-10-CM | POA: Diagnosis not present

## 2021-11-21 DIAGNOSIS — I480 Paroxysmal atrial fibrillation: Secondary | ICD-10-CM | POA: Diagnosis not present

## 2021-11-21 DIAGNOSIS — K831 Obstruction of bile duct: Secondary | ICD-10-CM | POA: Diagnosis not present

## 2021-11-21 DIAGNOSIS — C221 Intrahepatic bile duct carcinoma: Secondary | ICD-10-CM | POA: Diagnosis not present

## 2021-11-21 NOTE — Discharge Summary (Signed)
-  Please see full discharge summary dictated on 11/17/2021  Disposition-patient was discharged to SNF facility on 11/17/2021, however SNF facility was unable to pick patient up -On 11/18/2021 patient and son requested discharge home with home health services instead of discharge to SNF facility -So patient is now discharged  Home with [facility] oxygen   -Please see full discharge summary dictated on 11/17/2021  Roxan Hockey, MD

## 2021-11-21 NOTE — Telephone Encounter (Signed)
Call Terry Hernandez to discuss his paperwork for [facility]

## 2021-11-24 ENCOUNTER — Encounter: Payer: Self-pay | Admitting: *Deleted

## 2021-11-24 ENCOUNTER — Ambulatory Visit (HOSPITAL_COMMUNITY): Admission: RE | Admit: 2021-11-24 | Payer: Medicare Other | Source: Ambulatory Visit

## 2021-11-24 ENCOUNTER — Telehealth: Payer: Self-pay | Admitting: Family Medicine

## 2021-11-24 DIAGNOSIS — I129 Hypertensive chronic kidney disease with stage 1 through stage 4 chronic kidney disease, or unspecified chronic kidney disease: Secondary | ICD-10-CM | POA: Diagnosis not present

## 2021-11-24 DIAGNOSIS — C221 Intrahepatic bile duct carcinoma: Secondary | ICD-10-CM | POA: Diagnosis not present

## 2021-11-24 DIAGNOSIS — I480 Paroxysmal atrial fibrillation: Secondary | ICD-10-CM | POA: Diagnosis not present

## 2021-11-24 DIAGNOSIS — K831 Obstruction of bile duct: Secondary | ICD-10-CM | POA: Diagnosis not present

## 2021-11-24 DIAGNOSIS — N390 Urinary tract infection, site not specified: Secondary | ICD-10-CM | POA: Diagnosis not present

## 2021-11-24 NOTE — Progress Notes (Signed)
Phone call with Terry Hernandez, son that he was unable to get Terry Hernandez to MRI today. Patient said he was too tired. Talked with patient regarding upcoming appt with Dr Benay Spice tomorrow and rescheduling Terry abdomen. Terry Hernandez will call tomorrow and discuss

## 2021-11-24 NOTE — Telephone Encounter (Signed)
Does [facility] or palliative care have a way to check post-void residuals and reinsert the foley if needed?

## 2021-11-24 NOTE — Progress Notes (Signed)
Completed FMLA forms for son, Osborn Pullin faxed to [facility] [employer] [phone]. Copy to HIM to scan and will provide copy to patient at next visit.

## 2021-11-25 ENCOUNTER — Inpatient Hospital Stay: Payer: Medicare Other

## 2021-11-25 ENCOUNTER — Encounter: Payer: Self-pay | Admitting: *Deleted

## 2021-11-25 ENCOUNTER — Inpatient Hospital Stay: Payer: Medicare Other | Admitting: Oncology

## 2021-11-25 NOTE — Progress Notes (Signed)
Spoke with son Terry Hernandez and canceled today's appts due to patient unable to be transported. Patient had initial visit with in home physical therapy and nursing yesterday.  He desires to work with them for a couple of weeks to gain strength and would like to reschedule abdomen MRI and F/U with Dr Benay Spice until after the new year. Rescheduled Dr Benay Spice and lab appts to 12/22/21 per patient request and gave [facility] Scheduling number to call and reschedule MRI at [facility]. He stated that he would call and schedule it to be done before return to clinic visit on 1/9. Encouraged Terry Hernandez to call with any needs or questions

## 2021-11-25 NOTE — Telephone Encounter (Signed)
Pts son called stating that pts [place] is with [facility] and his name is Delilah Shan and already confirmed that he knows how to take catheter out and reinsert if needed but needs verbal permission from pts PCP.

## 2021-11-26 ENCOUNTER — Telehealth: Payer: Self-pay | Admitting: Family Medicine

## 2021-11-26 DIAGNOSIS — K831 Obstruction of bile duct: Secondary | ICD-10-CM | POA: Diagnosis not present

## 2021-11-26 DIAGNOSIS — I480 Paroxysmal atrial fibrillation: Secondary | ICD-10-CM | POA: Diagnosis not present

## 2021-11-26 DIAGNOSIS — I129 Hypertensive chronic kidney disease with stage 1 through stage 4 chronic kidney disease, or unspecified chronic kidney disease: Secondary | ICD-10-CM | POA: Diagnosis not present

## 2021-11-26 DIAGNOSIS — C221 Intrahepatic bile duct carcinoma: Secondary | ICD-10-CM | POA: Diagnosis not present

## 2021-11-26 DIAGNOSIS — N390 Urinary tract infection, site not specified: Secondary | ICD-10-CM | POA: Diagnosis not present

## 2021-11-26 NOTE — Telephone Encounter (Signed)
I wanted to make sure that the [facility] nurse would be able to check a post-void residual after the foley is removed to assess if the foley needs to be reinserted. His foley was inserted due to an obstruction from what I can tell on the discharge summary from the hospital. So checking post-void residual will be necessary. If the [facility] nurse is able to do this, then yes, she she may removed the foley, check a post-void residual, and reinsert if necessary. Otherwise, he will need to follow up with urology.     The above is from previous telephone encounter (Tiffany's phone note)   I spoke with son and he says the [facility] nurse lives a few minutes from them and he had said he would check in on him after removing the foley and then if he isn't voiding he would reinsert the foley. He did give me Delilah Shan [facility] nurse) [phone] cell to call and talk with. I spoke with Delilah Shan and he said he could do a post void residual and just would need to know the appropriate amount of urine post void that is acceptable to leave foley out or to re-insert. Please advised and I will call him back to give him the verbal order.

## 2021-11-26 NOTE — Telephone Encounter (Signed)
I spoke to [place] and advised of provider feedback and gave verbal order for removal of foley and to do Post Void Residual with parameters on whether or not to reinsert per TM notes.   I also spoke to pt's son and advised that [facility] would be removing foley.

## 2021-11-26 NOTE — Telephone Encounter (Signed)
I wanted to make sure that the [facility] nurse would be able to check a post-void residual after the foley is removed to assess if the foley needs to be reinserted. His foley was inserted due to an obstruction from what I can tell on the discharge summary from the hospital. So checking post-void residual will be necessary. If the [facility] nurse is able to do this, then yes, she she may removed the foley, check a post-void residual, and reinsert if necessary. Otherwise, he will need to follow up with urology.

## 2021-11-26 NOTE — Telephone Encounter (Signed)
Reinsert foley if post void residual is 300 ml or greater.

## 2021-11-26 NOTE — Telephone Encounter (Signed)
Pt's son returning call about [facility] nurse. Please call back. Unable to attach this message to the other one from today.

## 2021-11-27 ENCOUNTER — Ambulatory Visit: Payer: Medicare Other | Admitting: Urology

## 2021-11-27 DIAGNOSIS — C221 Intrahepatic bile duct carcinoma: Secondary | ICD-10-CM | POA: Diagnosis not present

## 2021-11-27 DIAGNOSIS — N390 Urinary tract infection, site not specified: Secondary | ICD-10-CM | POA: Diagnosis not present

## 2021-11-27 DIAGNOSIS — K831 Obstruction of bile duct: Secondary | ICD-10-CM | POA: Diagnosis not present

## 2021-11-27 DIAGNOSIS — I129 Hypertensive chronic kidney disease with stage 1 through stage 4 chronic kidney disease, or unspecified chronic kidney disease: Secondary | ICD-10-CM | POA: Diagnosis not present

## 2021-11-27 DIAGNOSIS — I480 Paroxysmal atrial fibrillation: Secondary | ICD-10-CM | POA: Diagnosis not present

## 2021-11-28 DIAGNOSIS — Z515 Encounter for palliative care: Secondary | ICD-10-CM | POA: Diagnosis not present

## 2021-11-28 DIAGNOSIS — C249 Malignant neoplasm of biliary tract, unspecified: Secondary | ICD-10-CM | POA: Diagnosis not present

## 2021-12-01 DIAGNOSIS — K831 Obstruction of bile duct: Secondary | ICD-10-CM | POA: Diagnosis not present

## 2021-12-01 DIAGNOSIS — C221 Intrahepatic bile duct carcinoma: Secondary | ICD-10-CM | POA: Diagnosis not present

## 2021-12-01 DIAGNOSIS — I480 Paroxysmal atrial fibrillation: Secondary | ICD-10-CM | POA: Diagnosis not present

## 2021-12-01 DIAGNOSIS — N390 Urinary tract infection, site not specified: Secondary | ICD-10-CM | POA: Diagnosis not present

## 2021-12-01 DIAGNOSIS — I129 Hypertensive chronic kidney disease with stage 1 through stage 4 chronic kidney disease, or unspecified chronic kidney disease: Secondary | ICD-10-CM | POA: Diagnosis not present

## 2021-12-02 DIAGNOSIS — I480 Paroxysmal atrial fibrillation: Secondary | ICD-10-CM | POA: Diagnosis not present

## 2021-12-02 DIAGNOSIS — I129 Hypertensive chronic kidney disease with stage 1 through stage 4 chronic kidney disease, or unspecified chronic kidney disease: Secondary | ICD-10-CM | POA: Diagnosis not present

## 2021-12-02 DIAGNOSIS — C221 Intrahepatic bile duct carcinoma: Secondary | ICD-10-CM | POA: Diagnosis not present

## 2021-12-02 DIAGNOSIS — K831 Obstruction of bile duct: Secondary | ICD-10-CM | POA: Diagnosis not present

## 2021-12-02 DIAGNOSIS — N390 Urinary tract infection, site not specified: Secondary | ICD-10-CM | POA: Diagnosis not present

## 2021-12-03 DIAGNOSIS — I129 Hypertensive chronic kidney disease with stage 1 through stage 4 chronic kidney disease, or unspecified chronic kidney disease: Secondary | ICD-10-CM | POA: Diagnosis not present

## 2021-12-03 DIAGNOSIS — C221 Intrahepatic bile duct carcinoma: Secondary | ICD-10-CM | POA: Diagnosis not present

## 2021-12-03 DIAGNOSIS — K831 Obstruction of bile duct: Secondary | ICD-10-CM | POA: Diagnosis not present

## 2021-12-03 DIAGNOSIS — I480 Paroxysmal atrial fibrillation: Secondary | ICD-10-CM | POA: Diagnosis not present

## 2021-12-03 DIAGNOSIS — N390 Urinary tract infection, site not specified: Secondary | ICD-10-CM | POA: Diagnosis not present

## 2021-12-04 DIAGNOSIS — C221 Intrahepatic bile duct carcinoma: Secondary | ICD-10-CM | POA: Diagnosis not present

## 2021-12-04 DIAGNOSIS — K831 Obstruction of bile duct: Secondary | ICD-10-CM | POA: Diagnosis not present

## 2021-12-04 DIAGNOSIS — I129 Hypertensive chronic kidney disease with stage 1 through stage 4 chronic kidney disease, or unspecified chronic kidney disease: Secondary | ICD-10-CM | POA: Diagnosis not present

## 2021-12-04 DIAGNOSIS — I480 Paroxysmal atrial fibrillation: Secondary | ICD-10-CM | POA: Diagnosis not present

## 2021-12-04 DIAGNOSIS — N390 Urinary tract infection, site not specified: Secondary | ICD-10-CM | POA: Diagnosis not present

## 2021-12-05 DIAGNOSIS — K831 Obstruction of bile duct: Secondary | ICD-10-CM | POA: Diagnosis not present

## 2021-12-05 DIAGNOSIS — C221 Intrahepatic bile duct carcinoma: Secondary | ICD-10-CM | POA: Diagnosis not present

## 2021-12-05 DIAGNOSIS — I129 Hypertensive chronic kidney disease with stage 1 through stage 4 chronic kidney disease, or unspecified chronic kidney disease: Secondary | ICD-10-CM | POA: Diagnosis not present

## 2021-12-05 DIAGNOSIS — N390 Urinary tract infection, site not specified: Secondary | ICD-10-CM | POA: Diagnosis not present

## 2021-12-05 DIAGNOSIS — I480 Paroxysmal atrial fibrillation: Secondary | ICD-10-CM | POA: Diagnosis not present

## 2021-12-09 DIAGNOSIS — C221 Intrahepatic bile duct carcinoma: Secondary | ICD-10-CM | POA: Diagnosis not present

## 2021-12-09 DIAGNOSIS — I480 Paroxysmal atrial fibrillation: Secondary | ICD-10-CM | POA: Diagnosis not present

## 2021-12-09 DIAGNOSIS — N390 Urinary tract infection, site not specified: Secondary | ICD-10-CM | POA: Diagnosis not present

## 2021-12-09 DIAGNOSIS — K831 Obstruction of bile duct: Secondary | ICD-10-CM | POA: Diagnosis not present

## 2021-12-09 DIAGNOSIS — I129 Hypertensive chronic kidney disease with stage 1 through stage 4 chronic kidney disease, or unspecified chronic kidney disease: Secondary | ICD-10-CM | POA: Diagnosis not present

## 2021-12-10 ENCOUNTER — Encounter (HOSPITAL_BASED_OUTPATIENT_CLINIC_OR_DEPARTMENT_OTHER): Payer: Self-pay

## 2021-12-11 DIAGNOSIS — C221 Intrahepatic bile duct carcinoma: Secondary | ICD-10-CM | POA: Diagnosis not present

## 2021-12-11 DIAGNOSIS — K831 Obstruction of bile duct: Secondary | ICD-10-CM | POA: Diagnosis not present

## 2021-12-11 DIAGNOSIS — I129 Hypertensive chronic kidney disease with stage 1 through stage 4 chronic kidney disease, or unspecified chronic kidney disease: Secondary | ICD-10-CM | POA: Diagnosis not present

## 2021-12-11 DIAGNOSIS — N390 Urinary tract infection, site not specified: Secondary | ICD-10-CM | POA: Diagnosis not present

## 2021-12-11 DIAGNOSIS — I480 Paroxysmal atrial fibrillation: Secondary | ICD-10-CM | POA: Diagnosis not present

## 2021-12-16 DIAGNOSIS — K831 Obstruction of bile duct: Secondary | ICD-10-CM | POA: Diagnosis not present

## 2021-12-16 DIAGNOSIS — C221 Intrahepatic bile duct carcinoma: Secondary | ICD-10-CM | POA: Diagnosis not present

## 2021-12-16 DIAGNOSIS — I480 Paroxysmal atrial fibrillation: Secondary | ICD-10-CM | POA: Diagnosis not present

## 2021-12-16 DIAGNOSIS — N390 Urinary tract infection, site not specified: Secondary | ICD-10-CM | POA: Diagnosis not present

## 2021-12-16 DIAGNOSIS — I129 Hypertensive chronic kidney disease with stage 1 through stage 4 chronic kidney disease, or unspecified chronic kidney disease: Secondary | ICD-10-CM | POA: Diagnosis not present

## 2021-12-18 ENCOUNTER — Other Ambulatory Visit: Payer: Self-pay

## 2021-12-18 ENCOUNTER — Ambulatory Visit (INDEPENDENT_AMBULATORY_CARE_PROVIDER_SITE_OTHER): Payer: Medicare Other

## 2021-12-18 DIAGNOSIS — N401 Enlarged prostate with lower urinary tract symptoms: Secondary | ICD-10-CM

## 2021-12-18 DIAGNOSIS — I480 Paroxysmal atrial fibrillation: Secondary | ICD-10-CM

## 2021-12-18 DIAGNOSIS — N183 Chronic kidney disease, stage 3 unspecified: Secondary | ICD-10-CM

## 2021-12-18 DIAGNOSIS — N179 Acute kidney failure, unspecified: Secondary | ICD-10-CM | POA: Diagnosis not present

## 2021-12-18 DIAGNOSIS — Z9981 Dependence on supplemental oxygen: Secondary | ICD-10-CM

## 2021-12-18 DIAGNOSIS — Z466 Encounter for fitting and adjustment of urinary device: Secondary | ICD-10-CM

## 2021-12-18 DIAGNOSIS — I129 Hypertensive chronic kidney disease with stage 1 through stage 4 chronic kidney disease, or unspecified chronic kidney disease: Secondary | ICD-10-CM | POA: Diagnosis not present

## 2021-12-18 DIAGNOSIS — B9620 Unspecified Escherichia coli [E. coli] as the cause of diseases classified elsewhere: Secondary | ICD-10-CM | POA: Diagnosis not present

## 2021-12-18 DIAGNOSIS — N390 Urinary tract infection, site not specified: Secondary | ICD-10-CM | POA: Diagnosis not present

## 2021-12-18 DIAGNOSIS — K831 Obstruction of bile duct: Secondary | ICD-10-CM | POA: Diagnosis not present

## 2021-12-18 DIAGNOSIS — C221 Intrahepatic bile duct carcinoma: Secondary | ICD-10-CM | POA: Diagnosis not present

## 2021-12-18 DIAGNOSIS — R7303 Prediabetes: Secondary | ICD-10-CM

## 2021-12-18 DIAGNOSIS — N139 Obstructive and reflux uropathy, unspecified: Secondary | ICD-10-CM

## 2021-12-18 DIAGNOSIS — J9611 Chronic respiratory failure with hypoxia: Secondary | ICD-10-CM

## 2021-12-18 DIAGNOSIS — M48062 Spinal stenosis, lumbar region with neurogenic claudication: Secondary | ICD-10-CM | POA: Diagnosis not present

## 2021-12-18 DIAGNOSIS — D509 Iron deficiency anemia, unspecified: Secondary | ICD-10-CM

## 2021-12-18 DIAGNOSIS — E46 Unspecified protein-calorie malnutrition: Secondary | ICD-10-CM | POA: Diagnosis not present

## 2021-12-18 DIAGNOSIS — R32 Unspecified urinary incontinence: Secondary | ICD-10-CM

## 2021-12-19 DIAGNOSIS — N390 Urinary tract infection, site not specified: Secondary | ICD-10-CM | POA: Diagnosis not present

## 2021-12-19 DIAGNOSIS — C221 Intrahepatic bile duct carcinoma: Secondary | ICD-10-CM | POA: Diagnosis not present

## 2021-12-19 DIAGNOSIS — I129 Hypertensive chronic kidney disease with stage 1 through stage 4 chronic kidney disease, or unspecified chronic kidney disease: Secondary | ICD-10-CM | POA: Diagnosis not present

## 2021-12-19 DIAGNOSIS — K831 Obstruction of bile duct: Secondary | ICD-10-CM | POA: Diagnosis not present

## 2021-12-19 DIAGNOSIS — I480 Paroxysmal atrial fibrillation: Secondary | ICD-10-CM | POA: Diagnosis not present

## 2021-12-20 DIAGNOSIS — J90 Pleural effusion, not elsewhere classified: Secondary | ICD-10-CM | POA: Diagnosis not present

## 2021-12-20 DIAGNOSIS — N390 Urinary tract infection, site not specified: Secondary | ICD-10-CM | POA: Diagnosis not present

## 2021-12-20 DIAGNOSIS — E440 Moderate protein-calorie malnutrition: Secondary | ICD-10-CM | POA: Diagnosis not present

## 2021-12-22 ENCOUNTER — Inpatient Hospital Stay: Payer: Medicare Other | Admitting: Oncology

## 2021-12-22 ENCOUNTER — Inpatient Hospital Stay: Payer: Medicare Other

## 2021-12-22 ENCOUNTER — Encounter: Payer: Self-pay | Admitting: *Deleted

## 2021-12-22 DIAGNOSIS — I129 Hypertensive chronic kidney disease with stage 1 through stage 4 chronic kidney disease, or unspecified chronic kidney disease: Secondary | ICD-10-CM | POA: Diagnosis not present

## 2021-12-22 DIAGNOSIS — C221 Intrahepatic bile duct carcinoma: Secondary | ICD-10-CM | POA: Diagnosis not present

## 2021-12-22 DIAGNOSIS — N390 Urinary tract infection, site not specified: Secondary | ICD-10-CM | POA: Diagnosis not present

## 2021-12-22 DIAGNOSIS — I480 Paroxysmal atrial fibrillation: Secondary | ICD-10-CM | POA: Diagnosis not present

## 2021-12-22 DIAGNOSIS — K831 Obstruction of bile duct: Secondary | ICD-10-CM | POA: Diagnosis not present

## 2021-12-22 NOTE — Progress Notes (Signed)
Spoke with son today as he called and left message on Friday to cancel appt with Dr Terry Hernandez today as they had a death in the family, Mr Terry Hernandez's brother passed away.   Terry Hernandez stated that he was going to call Radiology to reschedule MR of abdomen scan and once it is scheduled he will call and reschedule with Dr Terry Hernandez.

## 2021-12-23 ENCOUNTER — Encounter: Payer: Self-pay | Admitting: *Deleted

## 2021-12-23 NOTE — Progress Notes (Signed)
Spoke with son [name] at [employer] regarding goals of care. He has reschedule MR of abdomen at [facility] for 1/18 but is having goals of care discussion with father and Mr Rueb has desires of continuing PT at home to become stronger but has expressed desire not to seek treatment of biliary tract cancer.  [name] will continue to discuss with his father and "abide by his wishes".   [name] will call if pt decides to proceed with MR abdomen and will schedule F/U with Dr Benay Spice if he has scan done. [name] describes his father as having "good days and bad days"  We discussed Palliative care referral vs Hospice referral. After discussion [name] stated that he would talk with his father and call back.

## 2021-12-24 ENCOUNTER — Ambulatory Visit (HOSPITAL_BASED_OUTPATIENT_CLINIC_OR_DEPARTMENT_OTHER): Payer: Medicare Other | Admitting: Cardiology

## 2021-12-24 ENCOUNTER — Encounter (HOSPITAL_BASED_OUTPATIENT_CLINIC_OR_DEPARTMENT_OTHER): Payer: Self-pay | Admitting: Cardiology

## 2021-12-24 ENCOUNTER — Other Ambulatory Visit: Payer: Self-pay

## 2021-12-24 VITALS — BP 117/65 | HR 124 | Ht 71.0 in | Wt 136.0 lb

## 2021-12-24 DIAGNOSIS — I4891 Unspecified atrial fibrillation: Secondary | ICD-10-CM

## 2021-12-24 DIAGNOSIS — C801 Malignant (primary) neoplasm, unspecified: Secondary | ICD-10-CM | POA: Diagnosis not present

## 2021-12-24 DIAGNOSIS — I10 Essential (primary) hypertension: Secondary | ICD-10-CM | POA: Diagnosis not present

## 2021-12-24 DIAGNOSIS — I700 Atherosclerosis of aorta: Secondary | ICD-10-CM | POA: Diagnosis not present

## 2021-12-24 DIAGNOSIS — I4821 Permanent atrial fibrillation: Secondary | ICD-10-CM

## 2021-12-24 NOTE — Progress Notes (Incomplete)
Cardiology Office Note:    Date:  12/24/2021   ID:  Terry Hernandez, DOB [birth year], MRN [id number]  PCP:  [name], FNP  Cardiologist:  None  Referring MD: Ladell Pier, MD   No chief complaint on file.   History of Present Illness:    Terry Hernandez is a 88 y.o. male with a hx of paroxysmal atrial fibrillation, hypertension, hyperlipidemia, GERD, pneumonia, renal calculi, and arthritis, who is seen as a new consult at the request of Ladell Pier, MD for the evaluation and management of irregular heart rate. Previously followed by [facility].   He saw Dr. Benay Spice 11/11/2021 and was found to be irregular and tachycardic on exam. There was concern for atrial fibrillation. Referral to cardiology was sent 11/13/2021.    Terry Hernandez was admitted 11/14/2021 following worsening generalized weakness over several days. CXR showed no acute cardiopulmonary abnormality. CT-A of chest showed small right pleural effusion and mild pulmonary edema. He was discharged home on 11/18/21 with home health services.  He was in the hospital 10/24/21 - 10/29/21 due to distal common bile duct stricture with transaminitis. ERCP was performed and plastic stent placed to the distal CBD on 10/26/2021, replaced stent on 10/30/2021 with uncovered metal stent.  Cardiovascular risk factors: Prior cardiac testing and/or incidental findings on other testing: He confirms being told previously that his heart beat was irregular. Current diet: They have been working on increasing the amount of food he eats.   Today, he is accompanied by his son, who provides most of the history.  In the past year, his resting heart rate has gradually become elevated. However, his high resting heart rate has also been ongoing for years. His resting heart rate is typically similar to in clinic today at 124 bpm.    Lately he has been on 3 L oxygen at home. This was removed prior to his SpO2 reading of 93% today.  His son  also reports that he has other health issues and is gradually feeling weaker. Some days he is able to get out of the bed, and other days he is unable to do this.  He denies any chest pain. No lightheadedness, headaches, syncope, orthopnea, PND, lower extremity edema or exertional symptoms.  Past Medical History:  Diagnosis Date   Arthritis    GERD (gastroesophageal reflux disease)    PMH   Glucose intolerance (impaired glucose tolerance)    High cholesterol    History of kidney stones    HTN (hypertension)    Hyperlipidemia    Lumbar stenosis with neurogenic claudication    Pneumonia    Subcutaneous mass    Wears glasses     Past Surgical History:  Procedure Laterality Date   APPENDECTOMY     BILIARY BRUSHING  10/26/2021   Procedure: BILIARY BRUSHING;  Surgeon: Clarene Essex, MD;  Location: [facility] ENDOSCOPY;  Service: Endoscopy;;   BILIARY STENT PLACEMENT  10/26/2021   Procedure: BILIARY STENT PLACEMENT;  Surgeon: Clarene Essex, MD;  Location: [facility] ENDOSCOPY;  Service: Endoscopy;;   BILIARY STENT PLACEMENT  10/30/2021   Procedure: BILIARY STENT PLACEMENT;  Surgeon: Clarene Essex, MD;  Location: [place];  Service: Endoscopy;;   COLONOSCOPY W/ BIOPSIES AND POLYPECTOMY     ERCP N/A 10/26/2021   Procedure: ENDOSCOPIC RETROGRADE CHOLANGIOPANCREATOGRAPHY (ERCP);  Surgeon: Clarene Essex, MD;  Location: [place];  Service: Endoscopy;  Laterality: N/A;   ERCP N/A 10/30/2021   Procedure: ENDOSCOPIC RETROGRADE CHOLANGIOPANCREATOGRAPHY (ERCP);  Surgeon: Clarene Essex,  MD;  Location: [place] ENDOSCOPY;  Service: Endoscopy;  Laterality: N/A;   ESOPHAGOGASTRODUODENOSCOPY N/A 10/26/2021   Procedure: ESOPHAGOGASTRODUODENOSCOPY (EGD);  Surgeon: Clarene Essex, MD;  Location: [place];  Service: Endoscopy;  Laterality: N/A;   LUMBAR LAMINECTOMY/DECOMPRESSION MICRODISCECTOMY Bilateral 03/18/2020   Procedure: Bilateral Lumbar three-four Lumbar four-five Laminotomy/foraminotomy;  Surgeon: Kristeen Miss, MD;   Location: [place];  Service: Neurosurgery;  Laterality: Bilateral;   MASS EXCISION Right 03/18/2020   Procedure: Excision of a Right gluteal subcutaneous mass;  Surgeon: Kristeen Miss, MD;  Location: [place];  Service: Neurosurgery;  Laterality: Right;   MULTIPLE TOOTH EXTRACTIONS     SPHINCTEROTOMY  10/26/2021   Procedure: SPHINCTEROTOMY;  Surgeon: Clarene Essex, MD;  Location: [facility] ENDOSCOPY;  Service: Endoscopy;;   STENT REMOVAL  10/30/2021   Procedure: STENT REMOVAL;  Surgeon: Clarene Essex, MD;  Location: [facility] ENDOSCOPY;  Service: Endoscopy;;    Current Medications: Current Outpatient Medications on File Prior to Visit  Medication Sig   feeding supplement (ENSURE ENLIVE / ENSURE PLUS) LIQD Take 237 mLs by mouth 3 (three) times daily between meals.   LORazepam (ATIVAN) 1 MG tablet Take 1 tablet (1 mg total) by mouth every 12 (twelve) hours as needed for anxiety or sleep.   metoprolol succinate (TOPROL-XL) 25 MG 24 hr tablet Take 0.5 tablets (12.5 mg total) by mouth daily.   mirtazapine (REMERON SOL-TAB) 15 MG disintegrating tablet Take 0.5 tablets (7.5 mg total) by mouth at bedtime.   Multiple Vitamin (MULTIVITAMIN) tablet Take 1 tablet by mouth at bedtime.    senna-docusate (SENOKOT-S) 8.6-50 MG tablet Take 2 tablets by mouth at bedtime.   silodosin (RAPAFLO) 4 MG CAPS capsule Take 1 capsule (4 mg total) by mouth daily with breakfast.   methocarbamol (ROBAXIN) 500 MG tablet Take 1 tablet (500 mg total) by mouth every 6 (six) hours as needed for muscle spasms. (Patient not taking: Reported on 12/24/2021)   pantoprazole (PROTONIX) 40 MG tablet Take 1 tablet (40 mg total) by mouth daily.   phenazopyridine (PYRIDIUM) 200 MG tablet Take 1 tablet (200 mg total) by mouth 3 (three) times daily as needed for pain. (Patient not taking: Reported on 12/24/2021)   No current facility-administered medications on file prior to visit.     Allergies:   Cephalexin   Social History   Tobacco Use   Smoking status:  Never   Smokeless tobacco: Never  Vaping Use   Vaping Use: Never used  Substance Use Topics   Alcohol use: Never   Drug use: Never    Family History: family history includes Cancer in his mother and son; Stroke in his father.  ROS:   Please see the history of present illness.  Additional pertinent ROS: Constitutional: Negative for chills, fever, night sweats, unintentional weight loss  HENT: Negative for ear pain and hearing loss.   Eyes: Negative for loss of vision and eye pain.  Respiratory: Negative for cough, sputum, wheezing. Positive for shortness of breath. Cardiovascular: See HPI. Gastrointestinal: Negative for abdominal pain, melena, and hematochezia.  Genitourinary: Negative for dysuria and hematuria.  Musculoskeletal: Negative for falls and myalgias. Positive for generalized weakness. Skin: Negative for itching and rash.  Neurological: Negative for focal weakness, focal sensory changes and loss of consciousness.  Endo/Heme/Allergies: Does not bruise/bleed easily.     EKGs/Labs/Other Studies Reviewed:    The following studies were reviewed today:  Echo 11/15/2021:  1. Left ventricular ejection fraction, by estimation, is 65 to 70%. The  left ventricle has normal function.  The left ventricle has no regional  wall motion abnormalities. There is moderate left ventricular hypertrophy.  Left ventricular diastolic  parameters are indeterminate.   2. Right ventricular systolic function is normal. The right ventricular  size is normal.   3. The mitral valve is myxomatous. Trivial mitral valve regurgitation.   4. The aortic valve is tricuspid. Aortic valve regurgitation is not  visualized. Aortic valve sclerosis is present, with no evidence of aortic  valve stenosis.   5. The inferior vena cava is normal in size with greater than 50%  respiratory variability, suggesting right atrial pressure of 3 mmHg.   CTA Chest 11/14/2021: FINDINGS: Cardiovascular: Contrast injection  is sufficient to demonstrate satisfactory opacification of the pulmonary arteries to the segmental level. There is no pulmonary embolus or evidence of right heart strain. The size of the main pulmonary artery is normal. Heart size is normal, with no pericardial effusion. The course and caliber of the aorta are normal. There is atherosclerotic calcification. No acute aortic syndrome.   Mediastinum/Nodes: No mediastinal, hilar or axillary lymphadenopathy. Normal visualized thyroid. Thoracic esophageal course is normal.   Lungs/Pleura: Small right pleural effusion. Interstitial thickening within the right upper lobe. Bibasilar atelectasis.   Upper Abdomen: Contrast bolus timing is not optimized for evaluation of the abdominal organs. Pneumobilia. There is a stent in the common bile duct, incompletely visualized.   Musculoskeletal: No chest wall abnormality. No bony spinal canal stenosis.   Review of the MIP images confirms the above findings.   IMPRESSION: 1. No pulmonary embolus or acute aortic syndrome. 2. Small right pleural effusion and mild pulmonary edema.   Aortic Atherosclerosis (ICD10-I70.0).  EKG:  EKG is personally reviewed.   12/24/2021: ***  Recent Labs: 11/15/2021: Magnesium 1.7 11/17/2021: ALT 42; BUN 23; Creatinine, Ser 1.36; Hemoglobin 9.8; Platelets 342; Potassium 3.4; Sodium 136   Recent Lipid Panel    Component Value Date/Time   CHOL 285 (H) 10/25/2021 0452   TRIG 124 10/25/2021 0452   HDL <10 (L) 10/25/2021 0452   CHOLHDL NOT CALCULATED 10/25/2021 0452   VLDL 25 10/25/2021 0452   LDLCALC NOT CALCULATED 10/25/2021 0452    Physical Exam:    VS:  BP 117/65 (BP Location: Left Arm, Patient Position: Sitting, Cuff Size: Small)    Pulse (!) 124    Ht 5\' 11"  (1.803 m)    Wt 136 lb (61.7 kg)    SpO2 93%    BMI 18.97 kg/m     Wt Readings from Last 3 Encounters:  12/24/21 136 lb (61.7 kg)  11/18/21 141 lb 12.1 oz (64.3 kg)  11/11/21 139 lb (63 kg)    GEN:  Well nourished, well developed in no acute distress HEENT: Normal, moist mucous membranes NECK: No JVD CARDIAC: ***Irregularly irregular, ***tachycardic, normal S1 and S2, no rubs or gallops. No murmur. VASCULAR: Radial and DP pulses 2+ bilaterally. No carotid bruits RESPIRATORY:  Clear to auscultation without rales, wheezing or rhonchi  ABDOMEN: Soft, non-tender, non-distended MUSCULOSKELETAL:  Ambulates independently. ***In wheelchair. SKIN: Warm and dry, no edema NEUROLOGIC:  Alert and oriented x 3. No focal neuro deficits noted. PSYCHIATRIC:  Normal affect    ASSESSMENT:    1. Permanent atrial fibrillation (HCC)    PLAN:     Cardiac risk counseling and prevention recommendations: -recommend heart healthy/Mediterranean diet, with whole grains, fruits, vegetable, fish, lean meats, nuts, and olive oil. Limit salt. -recommend moderate walking, 3-5 times/week for 30-50 minutes each session. Aim for at least 150 minutes.week.  Goal should be pace of 3 miles/hours, or walking 1.5 miles in 30 minutes -recommend avoidance of tobacco products. Avoid excess alcohol. -ASCVD risk score: The ASCVD Risk score (Arnett DK, et al., 2019) failed to calculate for the following reasons:   The 2019 ASCVD risk score is only valid for ages 74 to 49    Plan for follow up: 6 weeks or sooner as needed.  Buford Dresser, MD, PhD, [facility]    Medication Adjustments/Labs and Tests Ordered: Current medicines are reviewed at length with the patient today.  Concerns regarding medicines are outlined above.   Orders Placed This Encounter  Procedures   EKG 12-Lead   No orders of the defined types were placed in this encounter.  Patient Instructions  You are in atrial fibrillation with fast heart rate today. Your heart rate here is 124 bpm. I would like to have it down closer to 90 bpm in a perfect world, but definitely less than 110 bpm.  I would like to increase the  metoprolol succinate from 12.5 mg (1/2 tab) daily to 25 mg daily. I would like to have the blood pressure stay more than 90/50 and I do not want lightheadedness or passing out.  If the heart rate is still above 90 at rest and there is still blood pressure room, after a week I would like to to increase to 37.5 mg (1.5 pills) of metoprolol daily.  We can continue to gradually increase this by 12.5 mg weekly as long as blood pressure stays stable.   I,Mathew Stumpf,acting as a Education administrator for [employer], MD.,have documented all relevant documentation on the behalf of Buford Dresser, MD,as directed by  Buford Dresser, MD while in the presence of Buford Dresser, MD.  ***  Signed, Buford Dresser, MD PhD 12/24/2021 4:23 PM    [place]

## 2021-12-24 NOTE — Patient Instructions (Signed)
Medication Instructions:  You are in atrial fibrillation with fast heart rate today. Your heart rate here is 124 bpm. I would like to have it down closer to 90 bpm in a perfect world, but definitely less than 110 bpm.  I would like to increase the metoprolol succinate from 12.5 mg (1/2 tab) daily to 25 mg daily. I would like to have the blood pressure stay more than 90/50 and I do not want lightheadedness or passing out.  If the heart rate is still above 90 at rest and there is still blood pressure room, after a week I would like to to increase to 37.5 mg (1.5 pills) of metoprolol daily.  We can continue to gradually increase this by 12.5 mg weekly as long as blood pressure stays stable.  *If you need a refill on your cardiac medications before your next appointment, please call your pharmacy*   Lab Work: None ordered today   Testing/Procedures: None ordered today   Follow-Up: At [facility], you and your health needs are our priority.  As part of our continuing mission to provide you with exceptional heart care, we have created designated Provider Care Teams.  These Care Teams include your primary Cardiologist (physician) and Advanced Practice Providers (APPs -  Physician Assistants and Nurse Practitioners) who all work together to provide you with the care you need, when you need it.  We recommend signing up for the patient portal called "MyChart".  Sign up information is provided on this After Visit Summary.  MyChart is used to connect with patients for Virtual Visits (Telemedicine).  Patients are able to view lab/test results, encounter notes, upcoming appointments, etc.  Non-urgent messages can be sent to your provider as well.   To learn more about what you can do with MyChart, go to [email or web address].    Your next appointment:   6 week(s)  The format for your next appointment:   In Person  Provider:   Buford Dresser, MD

## 2021-12-25 ENCOUNTER — Telehealth (HOSPITAL_BASED_OUTPATIENT_CLINIC_OR_DEPARTMENT_OTHER): Payer: Self-pay | Admitting: Cardiology

## 2021-12-25 DIAGNOSIS — I480 Paroxysmal atrial fibrillation: Secondary | ICD-10-CM | POA: Diagnosis not present

## 2021-12-25 DIAGNOSIS — K831 Obstruction of bile duct: Secondary | ICD-10-CM | POA: Diagnosis not present

## 2021-12-25 DIAGNOSIS — C221 Intrahepatic bile duct carcinoma: Secondary | ICD-10-CM | POA: Diagnosis not present

## 2021-12-25 DIAGNOSIS — N390 Urinary tract infection, site not specified: Secondary | ICD-10-CM | POA: Diagnosis not present

## 2021-12-25 DIAGNOSIS — I129 Hypertensive chronic kidney disease with stage 1 through stage 4 chronic kidney disease, or unspecified chronic kidney disease: Secondary | ICD-10-CM | POA: Diagnosis not present

## 2021-12-25 MED ORDER — METOPROLOL SUCCINATE ER 25 MG PO TB24: 12.5000 mg | ORAL_TABLET | Freq: Every day | ORAL | 5 refills | Status: DC

## 2021-12-25 NOTE — Telephone Encounter (Signed)
°*  STAT* If patient is at the pharmacy, call can be transferred to refill team.   1. Which medications need to be refilled? (please list name of each medication and dose if known)  metoprolol succinate (TOPROL-XL) 25 MG 24 hr tablet   2. Which pharmacy/location (including street and city if local pharmacy) is medication to be sent to? [place], [place]  3. Do they need a 30 day or 90 day supply? 30 with refills  Wife states the rx was not sent in to the pharmacy after the patient's visit

## 2021-12-26 DIAGNOSIS — Z515 Encounter for palliative care: Secondary | ICD-10-CM | POA: Diagnosis not present

## 2021-12-26 DIAGNOSIS — C249 Malignant neoplasm of biliary tract, unspecified: Secondary | ICD-10-CM | POA: Diagnosis not present

## 2021-12-29 DIAGNOSIS — K831 Obstruction of bile duct: Secondary | ICD-10-CM | POA: Diagnosis not present

## 2021-12-29 DIAGNOSIS — N390 Urinary tract infection, site not specified: Secondary | ICD-10-CM | POA: Diagnosis not present

## 2021-12-29 DIAGNOSIS — I129 Hypertensive chronic kidney disease with stage 1 through stage 4 chronic kidney disease, or unspecified chronic kidney disease: Secondary | ICD-10-CM | POA: Diagnosis not present

## 2021-12-29 DIAGNOSIS — C221 Intrahepatic bile duct carcinoma: Secondary | ICD-10-CM | POA: Diagnosis not present

## 2021-12-29 DIAGNOSIS — I480 Paroxysmal atrial fibrillation: Secondary | ICD-10-CM | POA: Diagnosis not present

## 2021-12-30 ENCOUNTER — Encounter: Payer: Self-pay | Admitting: Family Medicine

## 2021-12-30 ENCOUNTER — Ambulatory Visit (INDEPENDENT_AMBULATORY_CARE_PROVIDER_SITE_OTHER): Payer: Medicare Other | Admitting: Family Medicine

## 2021-12-30 DIAGNOSIS — N3001 Acute cystitis with hematuria: Secondary | ICD-10-CM

## 2021-12-30 DIAGNOSIS — I4821 Permanent atrial fibrillation: Secondary | ICD-10-CM

## 2021-12-30 DIAGNOSIS — R748 Abnormal levels of other serum enzymes: Secondary | ICD-10-CM

## 2021-12-30 DIAGNOSIS — I10 Essential (primary) hypertension: Secondary | ICD-10-CM | POA: Diagnosis not present

## 2021-12-30 DIAGNOSIS — N390 Urinary tract infection, site not specified: Secondary | ICD-10-CM

## 2021-12-30 NOTE — Progress Notes (Signed)
Subjective:  Patient ID: Terry Hernandez, male    DOB: 03/23/1934, 88 y.o.   MRN: [id number]  Patient Care Team: [name], FNP as PCP - General (Family Medicine) Algie Coffer Mindi Slicker, RN as Oncology Nurse Navigator   Chief Complaint:  Jaundice (A-fib/Possible UTI)   HPI: Terry Hernandez is a 88 y.o. male presenting on 12/30/2021 for Jaundice (A-fib/Possible UTI)  Patient presents today for follow-up.  At initial visit to establish care patient was noted to be significantly jaundiced in office.  Imaging was ordered which revealed CBD stricture. He subsequently underwent surgery for biliary stent placement and he was referred to oncology due to malignant cells noted from brushing of CBD. Further testing and treatment was discussed. He has opted out of further testing as he does not wish to undergo aggressive treatment. He was admitted again on 11/14/2021 for biliary tract cancer, generalized weakness, FTT, anemia, E. Coli UTI, and hypoxia. He was discharged home 11/17/2021 with home health services. He has PT and [place] coming to house at least 2 times per week. He has been drinking Boost daily and seems to slowly be regaining his strength. He was evaluated by cardiology recently and he is slowly titrating up his BB therapy to gain better control of HR. He has been tolerating this well and plans to increase dose by 1/2 tablet tomorrow. He has noticed some slight dysuria and frequency over the last 2 days.   Relevant past medical, surgical, family, and social history reviewed and updated as indicated.  Allergies and medications reviewed and updated. Data reviewed: Chart in Epic.   Past Medical History:  Diagnosis Date   Arthritis    GERD (gastroesophageal reflux disease)    PMH   Glucose intolerance (impaired glucose tolerance)    High cholesterol    History of kidney stones    HTN (hypertension)    Hyperlipidemia    Lumbar stenosis with neurogenic claudication    Pneumonia     Subcutaneous mass    Wears glasses     Past Surgical History:  Procedure Laterality Date   APPENDECTOMY     BILIARY BRUSHING  10/26/2021   Procedure: BILIARY BRUSHING;  Surgeon: Clarene Essex, MD;  Location: [facility] ENDOSCOPY;  Service: Endoscopy;;   BILIARY STENT PLACEMENT  10/26/2021   Procedure: BILIARY STENT PLACEMENT;  Surgeon: Clarene Essex, MD;  Location: [facility] ENDOSCOPY;  Service: Endoscopy;;   BILIARY STENT PLACEMENT  10/30/2021   Procedure: BILIARY STENT PLACEMENT;  Surgeon: Clarene Essex, MD;  Location: [place];  Service: Endoscopy;;   COLONOSCOPY W/ BIOPSIES AND POLYPECTOMY     ERCP N/A 10/26/2021   Procedure: ENDOSCOPIC RETROGRADE CHOLANGIOPANCREATOGRAPHY (ERCP);  Surgeon: Clarene Essex, MD;  Location: [place];  Service: Endoscopy;  Laterality: N/A;   ERCP N/A 10/30/2021   Procedure: ENDOSCOPIC RETROGRADE CHOLANGIOPANCREATOGRAPHY (ERCP);  Surgeon: Clarene Essex, MD;  Location: [place];  Service: Endoscopy;  Laterality: N/A;   ESOPHAGOGASTRODUODENOSCOPY N/A 10/26/2021   Procedure: ESOPHAGOGASTRODUODENOSCOPY (EGD);  Surgeon: Clarene Essex, MD;  Location: [place];  Service: Endoscopy;  Laterality: N/A;   LUMBAR LAMINECTOMY/DECOMPRESSION MICRODISCECTOMY Bilateral 03/18/2020   Procedure: Bilateral Lumbar three-four Lumbar four-five Laminotomy/foraminotomy;  Surgeon: Kristeen Miss, MD;  Location: [place];  Service: Neurosurgery;  Laterality: Bilateral;   MASS EXCISION Right 03/18/2020   Procedure: Excision of a Right gluteal subcutaneous mass;  Surgeon: Kristeen Miss, MD;  Location: [place];  Service: Neurosurgery;  Laterality: Right;   MULTIPLE TOOTH EXTRACTIONS     SPHINCTEROTOMY  10/26/2021   Procedure: Joan [facility]ans;  Surgeon: Clarene Essex, MD;  Location: [facility] ENDOSCOPY;  Service: Endoscopy;;   STENT REMOVAL  10/30/2021   Procedure: STENT REMOVAL;  Surgeon: Clarene Essex, MD;  Location: [facility] ENDOSCOPY;  Service: Endoscopy;;    Social History   Socioeconomic History   Marital status:  Married    Spouse name: Not on file   Number of children: 2   Years of education: Not on file   Highest education level: Not on file  Occupational History   Not on file  Tobacco Use   Smoking status: Never   Smokeless tobacco: Never  Vaping Use   Vaping Use: Never used  Substance and Sexual Activity   Alcohol use: Never   Drug use: Never   Sexual activity: Not Currently  Other Topics Concern   Not on file  Social History Narrative   Not on file   Social Determinants of Health   Financial Resource Strain: Not on file  Food Insecurity: Not on file  Transportation Needs: Not on file  Physical Activity: Not on file  Stress: Not on file  Social Connections: Not on file  Intimate Partner Violence: Not on file    Outpatient Encounter Medications as of 12/30/2021  Medication Sig   feeding supplement (ENSURE ENLIVE / ENSURE PLUS) LIQD Take 237 mLs by mouth 3 (three) times daily between meals.   LORazepam (ATIVAN) 1 MG tablet Take 1 tablet (1 mg total) by mouth every 12 (twelve) hours as needed for anxiety or sleep.   methocarbamol (ROBAXIN) 500 MG tablet Take 1 tablet (500 mg total) by mouth every 6 (six) hours as needed for muscle spasms.   metoprolol succinate (TOPROL-XL) 25 MG 24 hr tablet Take 0.5 tablets (12.5 mg total) by mouth daily.   mirtazapine (REMERON SOL-TAB) 15 MG disintegrating tablet Take 0.5 tablets (7.5 mg total) by mouth at bedtime.   Multiple Vitamin (MULTIVITAMIN) tablet Take 1 tablet by mouth at bedtime.    senna-docusate (SENOKOT-S) 8.6-50 MG tablet Take 2 tablets by mouth at bedtime.   silodosin (RAPAFLO) 4 MG CAPS capsule Take 1 capsule (4 mg total) by mouth daily with breakfast.   pantoprazole (PROTONIX) 40 MG tablet Take 1 tablet (40 mg total) by mouth daily.   phenazopyridine (PYRIDIUM) 200 MG tablet Take 1 tablet (200 mg total) by mouth 3 (three) times daily as needed for pain. (Patient not taking: Reported on 12/30/2021)   No facility-administered  encounter medications on file as of 12/30/2021.    Allergies  Allergen Reactions   Cephalexin Rash    REACTION: rash    Review of Systems  Constitutional:  Positive for activity change, appetite change, fatigue and unexpected weight change. Negative for chills, diaphoresis and fever.  Respiratory:  Negative for cough and shortness of breath.   Cardiovascular:  Negative for chest pain, palpitations and leg swelling.  Gastrointestinal:  Negative for abdominal distention, abdominal pain, anal bleeding, blood in stool, constipation, diarrhea, nausea, rectal pain and vomiting.  Genitourinary:  Positive for dysuria and frequency. Negative for decreased urine volume and difficulty urinating.  Musculoskeletal:  Positive for gait problem (weakness). Negative for arthralgias and back pain.  Neurological:  Positive for weakness. Negative for dizziness, tremors, seizures, syncope, facial asymmetry, speech difficulty, light-headedness, numbness and headaches.  Hematological:  Negative for adenopathy. Does not bruise/bleed easily.  All other systems reviewed and are negative.      Objective:  BP (!) 106/57    Pulse (!) 120  Temp 98.7 F (37.1 C)    Ht 5' 11"  (1.803 m)    Wt 136 lb (61.7 kg)    SpO2 96%    BMI 18.97 kg/m    Wt Readings from Last 3 Encounters:  12/30/21 136 lb (61.7 kg)  12/24/21 136 lb (61.7 kg)  11/18/21 141 lb 12.1 oz (64.3 kg)    Physical Exam Vitals and nursing note reviewed.  Constitutional:      General: He is not in acute distress.    Appearance: Normal appearance. He is well-developed and well-groomed. He is ill-appearing (chronically). He is not toxic-appearing or diaphoretic.  HENT:     Head: Normocephalic and atraumatic.     Jaw: There is normal jaw occlusion.     Right Ear: Hearing normal.     Left Ear: Hearing normal.     Nose: Nose normal.     Mouth/Throat:     Lips: Pink.     Mouth: Mucous membranes are moist.     Pharynx: Oropharynx is clear. Uvula  midline.  Eyes:     General: Lids are normal.     Extraocular Movements: Extraocular movements intact.     Conjunctiva/sclera: Conjunctivae normal.     Pupils: Pupils are equal, round, and reactive to light.  Neck:     Thyroid: No thyroid mass, thyromegaly or thyroid tenderness.     Vascular: No carotid bruit or JVD.     Trachea: Trachea and phonation normal.  Cardiovascular:     Rate and Rhythm: Tachycardia present. Rhythm irregularly irregular.     Chest Wall: PMI is not displaced.     Pulses: Normal pulses.     Heart sounds: Normal heart sounds. No murmur heard.   No friction rub. No gallop.  Pulmonary:     Effort: Pulmonary effort is normal. No respiratory distress.     Breath sounds: Normal breath sounds. No wheezing.  Abdominal:     General: Bowel sounds are normal. There is no abdominal bruit.     Palpations: Abdomen is soft. There is no hepatomegaly or splenomegaly.  Musculoskeletal:        General: Normal range of motion.     Cervical back: Normal range of motion and neck supple.     Right lower leg: No edema.     Left lower leg: No edema.  Lymphadenopathy:     Cervical: No cervical adenopathy.  Skin:    General: Skin is warm and dry.     Capillary Refill: Capillary refill takes less than 2 seconds.     Coloration: Skin is pale. Skin is not cyanotic or jaundiced.     Findings: No rash.  Neurological:     General: No focal deficit present.     Mental Status: He is alert and oriented to person, place, and time.     Sensory: Sensation is intact.     Motor: Weakness (generalized) present.     Coordination: Coordination is intact.     Gait: Gait abnormal (in wheelchair).     Deep Tendon Reflexes: Reflexes are normal and symmetric.  Psychiatric:        Attention and Perception: Attention and perception normal.        Mood and Affect: Mood and affect normal.        Speech: Speech normal.        Behavior: Behavior normal. Behavior is cooperative.        Thought  Content: Thought content normal.  Cognition and Memory: Cognition and memory normal.        Judgment: Judgment normal.    Results for orders placed or performed during the hospital encounter of 11/14/21  Resp Panel by RT-PCR (Flu A&B, Covid) Nasopharyngeal Swab   Specimen: Nasopharyngeal Swab; Nasopharyngeal(NP) swabs in vial transport medium  Result Value Ref Range   SARS Coronavirus 2 by RT PCR NEGATIVE NEGATIVE   Influenza A by PCR NEGATIVE NEGATIVE   Influenza B by PCR NEGATIVE NEGATIVE  Urine Culture   Specimen: Urine, Catheterized  Result Value Ref Range   Specimen Description      URINE, CATHETERIZED Performed at [facility], [address] [place], [place] [address]    Special Requests      NONE Performed at [facility], [address], [place], [place] [address]    Culture >=100,000 COLONIES/mL ESCHERICHIA COLI (A)    Report Status 11/17/2021 FINAL    Organism ID, Bacteria ESCHERICHIA COLI (A)       Susceptibility   Escherichia coli - MIC*    AMPICILLIN <=2 SENSITIVE Sensitive     CEFAZOLIN <=4 SENSITIVE Sensitive     CEFEPIME <=0.12 SENSITIVE Sensitive     CEFTRIAXONE <=0.25 SENSITIVE Sensitive     CIPROFLOXACIN <=0.25 SENSITIVE Sensitive     GENTAMICIN <=1 SENSITIVE Sensitive     IMIPENEM 1 SENSITIVE Sensitive     NITROFURANTOIN <=16 SENSITIVE Sensitive     TRIMETH/SULFA <=20 SENSITIVE Sensitive     AMPICILLIN/SULBACTAM <=2 SENSITIVE Sensitive     PIP/TAZO <=4 SENSITIVE Sensitive     * >=100,000 COLONIES/mL ESCHERICHIA COLI  Urinalysis, Routine w reflex microscopic Urine, Catheterized  Result Value Ref Range   Color, Urine YELLOW YELLOW   APPearance CLEAR CLEAR   Specific Gravity, Urine >1.030 (H) 1.005 - 1.030   pH 5.0 5.0 - 8.0   Glucose, UA >=500 (A) NEGATIVE mg/dL   Hgb urine dipstick LARGE (A) NEGATIVE   Bilirubin Urine NEGATIVE NEGATIVE   Ketones, ur NEGATIVE NEGATIVE mg/dL   Protein, ur 30 (A) NEGATIVE mg/dL   Nitrite POSITIVE (A) NEGATIVE    Leukocytes,Ua NEGATIVE NEGATIVE  Basic metabolic panel  Result Value Ref Range   Sodium 136 135 - 145 mmol/L   Potassium 4.1 3.5 - 5.1 mmol/L   Chloride 100 98 - 111 mmol/L   CO2 25 22 - 32 mmol/L   Glucose, Bld 306 (H) 70 - 99 mg/dL   BUN 29 (H) 8 - 23 mg/dL   Creatinine, Ser 1.51 (H) 0.61 - 1.24 mg/dL   Calcium 8.2 (L) 8.9 - 10.3 mg/dL   GFR, Estimated 45 (L) >60 mL/min   Anion gap 11 5 - 15  CBC  Result Value Ref Range   WBC 9.1 4.0 - 10.5 K/uL   RBC 3.50 (L) 4.22 - 5.81 MIL/uL   Hemoglobin 12.1 (L) 13.0 - 17.0 g/dL   HCT 37.6 (L) 39.0 - 52.0 %   MCV 107.4 (H) 80.0 - 100.0 fL   MCH 34.6 (H) 26.0 - 34.0 pg   MCHC 32.2 30.0 - 36.0 g/dL   RDW 14.3 11.5 - 15.5 %   Platelets 400 150 - 400 K/uL   nRBC 0.0 0.0 - 0.2 %  Lactic acid, plasma  Result Value Ref Range   Lactic Acid, Venous 4.1 (HH) 0.5 - 1.9 mmol/L  Lactic acid, plasma  Result Value Ref Range   Lactic Acid, Venous 3.3 (HH) 0.5 - 1.9 mmol/L  Hepatic function panel  Result Value Ref  Range   Total Protein 6.3 (L) 6.5 - 8.1 g/dL   Albumin 2.2 (L) 3.5 - 5.0 g/dL   AST 61 (H) 15 - 41 U/L   ALT 65 (H) 0 - 44 U/L   Alkaline Phosphatase 405 (H) 38 - 126 U/L   Total Bilirubin 2.6 (H) 0.3 - 1.2 mg/dL   Bilirubin, Direct 1.2 (H) 0.0 - 0.2 mg/dL   Indirect Bilirubin 1.4 (H) 0.3 - 0.9 mg/dL  Lipase, blood  Result Value Ref Range   Lipase 57 (H) 11 - 51 U/L  Differential  Result Value Ref Range   Neutrophils Relative % 53 %   Neutro Abs 5.4 1.7 - 7.7 K/uL   Lymphocytes Relative 31 %   Lymphs Abs 3.1 0.7 - 4.0 K/uL   Monocytes Relative 11 %   Monocytes Absolute 1.1 (H) 0.1 - 1.0 K/uL   Eosinophils Relative 3 %   Eosinophils Absolute 0.3 0.0 - 0.5 K/uL   Basophils Relative 1 %   Basophils Absolute 0.1 0.0 - 0.1 K/uL   Immature Granulocytes 1 %   Abs Immature Granulocytes 0.06 0.00 - 0.07 K/uL  Comprehensive metabolic panel  Result Value Ref Range   Sodium 139 135 - 145 mmol/L   Potassium 4.1 3.5 - 5.1 mmol/L    Chloride 107 98 - 111 mmol/L   CO2 26 22 - 32 mmol/L   Glucose, Bld 233 (H) 70 - 99 mg/dL   BUN 28 (H) 8 - 23 mg/dL   Creatinine, Ser 1.35 (H) 0.61 - 1.24 mg/dL   Calcium 7.8 (L) 8.9 - 10.3 mg/dL   Total Protein 5.0 (L) 6.5 - 8.1 g/dL   Albumin 1.9 (L) 3.5 - 5.0 g/dL   AST 49 (H) 15 - 41 U/L   ALT 52 (H) 0 - 44 U/L   Alkaline Phosphatase 325 (H) 38 - 126 U/L   Total Bilirubin 2.1 (H) 0.3 - 1.2 mg/dL   GFR, Estimated 51 (L) >60 mL/min   Anion gap 6 5 - 15  CBC  Result Value Ref Range   WBC 8.3 4.0 - 10.5 K/uL   RBC 3.02 (L) 4.22 - 5.81 MIL/uL   Hemoglobin 10.2 (L) 13.0 - 17.0 g/dL   HCT 32.8 (L) 39.0 - 52.0 %   MCV 108.6 (H) 80.0 - 100.0 fL   MCH 33.8 26.0 - 34.0 pg   MCHC 31.1 30.0 - 36.0 g/dL   RDW 14.1 11.5 - 15.5 %   Platelets 273 150 - 400 K/uL   nRBC 0.0 0.0 - 0.2 %  APTT  Result Value Ref Range   aPTT 32 24 - 36 seconds  Magnesium  Result Value Ref Range   Magnesium 1.7 1.7 - 2.4 mg/dL  Phosphorus  Result Value Ref Range   Phosphorus 2.9 2.5 - 4.6 mg/dL  Urinalysis, Microscopic (reflex)  Result Value Ref Range   RBC / HPF 6-10 0 - 5 RBC/hpf   WBC, UA 11-20 0 - 5 WBC/hpf   Bacteria, UA MANY (A) NONE SEEN   Squamous Epithelial / LPF 0-5 0 - 5  Folate  Result Value Ref Range   Folate 21.1 >5.9 ng/mL  Vitamin B12  Result Value Ref Range   Vitamin B-12 1,031 (H) 180 - 914 pg/mL  Glucose, capillary  Result Value Ref Range   Glucose-Capillary 216 (H) 70 - 99 mg/dL  Glucose, capillary  Result Value Ref Range   Glucose-Capillary 224 (H) 70 - 99 mg/dL  Glucose, capillary  Result  Value Ref Range   Glucose-Capillary 280 (H) 70 - 99 mg/dL  Hepatic function panel  Result Value Ref Range   Total Protein 5.0 (L) 6.5 - 8.1 g/dL   Albumin 1.8 (L) 3.5 - 5.0 g/dL   AST 46 (H) 15 - 41 U/L   ALT 46 (H) 0 - 44 U/L   Alkaline Phosphatase 315 (H) 38 - 126 U/L   Total Bilirubin 1.6 (H) 0.3 - 1.2 mg/dL   Bilirubin, Direct 0.8 (H) 0.0 - 0.2 mg/dL   Indirect Bilirubin 0.8  0.3 - 0.9 mg/dL  Glucose, capillary  Result Value Ref Range   Glucose-Capillary 280 (H) 70 - 99 mg/dL  Glucose, capillary  Result Value Ref Range   Glucose-Capillary 161 (H) 70 - 99 mg/dL  Glucose, capillary  Result Value Ref Range   Glucose-Capillary 113 (H) 70 - 99 mg/dL  Glucose, capillary  Result Value Ref Range   Glucose-Capillary 218 (H) 70 - 99 mg/dL  CBC  Result Value Ref Range   WBC 12.2 (H) 4.0 - 10.5 K/uL   RBC 2.95 (L) 4.22 - 5.81 MIL/uL   Hemoglobin 9.8 (L) 13.0 - 17.0 g/dL   HCT 31.2 (L) 39.0 - 52.0 %   MCV 105.8 (H) 80.0 - 100.0 fL   MCH 33.2 26.0 - 34.0 pg   MCHC 31.4 30.0 - 36.0 g/dL   RDW 13.8 11.5 - 15.5 %   Platelets 342 150 - 400 K/uL   nRBC 0.0 0.0 - 0.2 %  Comprehensive metabolic panel  Result Value Ref Range   Sodium 136 135 - 145 mmol/L   Potassium 3.4 (L) 3.5 - 5.1 mmol/L   Chloride 106 98 - 111 mmol/L   CO2 25 22 - 32 mmol/L   Glucose, Bld 167 (H) 70 - 99 mg/dL   BUN 23 8 - 23 mg/dL   Creatinine, Ser 1.36 (H) 0.61 - 1.24 mg/dL   Calcium 7.7 (L) 8.9 - 10.3 mg/dL   Total Protein 5.1 (L) 6.5 - 8.1 g/dL   Albumin 1.8 (L) 3.5 - 5.0 g/dL   AST 46 (H) 15 - 41 U/L   ALT 42 0 - 44 U/L   Alkaline Phosphatase 305 (H) 38 - 126 U/L   Total Bilirubin 1.5 (H) 0.3 - 1.2 mg/dL   GFR, Estimated 51 (L) >60 mL/min   Anion gap 5 5 - 15  Glucose, capillary  Result Value Ref Range   Glucose-Capillary 151 (H) 70 - 99 mg/dL  Glucose, capillary  Result Value Ref Range   Glucose-Capillary 212 (H) 70 - 99 mg/dL  Glucose, capillary  Result Value Ref Range   Glucose-Capillary 246 (H) 70 - 99 mg/dL  Glucose, capillary  Result Value Ref Range   Glucose-Capillary 211 (H) 70 - 99 mg/dL  Glucose, capillary  Result Value Ref Range   Glucose-Capillary 181 (H) 70 - 99 mg/dL  Glucose, capillary  Result Value Ref Range   Glucose-Capillary 211 (H) 70 - 99 mg/dL  ECHOCARDIOGRAM COMPLETE  Result Value Ref Range   Weight 2,275.15 oz   Height 70 in   BP 98/54 mmHg    Area-P 1/2 3.95 cm2   S' Lateral 2.40 cm       Pertinent labs & imaging results that were available during my care of the patient were reviewed by me and considered in my medical decision making.  Assessment & Plan:  Terry Hernandez was seen today for jaundice.  Diagnoses and all orders for this visit:  Hyperbilirubinemia Elevated  liver enzymes Will repeat labs today. Color has greatly improved since last office visit.  -     CMP14+EGFR  Permanent atrial fibrillation ([place]) Tachycardic in office today. Cardiology has pt slowly titrating up BB dose for better rate control. Will check CBC.  -     CBC with Differential/Platelet  E Coli Acute lower UTI Will recheck urine today and treat if warranted.  -     Urinalysis, Routine w reflex microscopic -     Urine Culture  Essential hypertension Well controlled. Slowly titrating up BB dose. Pt aware to decrease dose if BP drops below 90/50.  -     Microalbumin / creatinine urine ratio     Continue all other maintenance medications.  Follow up plan: Return in about 3 months (around 03/30/2022), or if symptoms worsen or fail to improve, for PCP.   Continue healthy lifestyle choices, including diet (rich in fruits, vegetables, and lean proteins, and low in salt and simple carbohydrates) and exercise (at least 30 minutes of moderate physical activity daily).   The above assessment and management plan was discussed with the patient. The patient verbalized understanding of and has agreed to the management plan. Patient is aware to call the clinic if they develop any new symptoms or if symptoms persist or worsen. Patient is aware when to return to the clinic for a follow-up visit. Patient educated on when it is appropriate to go to the emergency department.   Monia Pouch, FNP-C [facility] [phone]

## 2021-12-31 ENCOUNTER — Other Ambulatory Visit: Payer: Medicare Other

## 2021-12-31 ENCOUNTER — Ambulatory Visit (HOSPITAL_COMMUNITY): Payer: Medicare Other

## 2021-12-31 DIAGNOSIS — I10 Essential (primary) hypertension: Secondary | ICD-10-CM | POA: Diagnosis not present

## 2021-12-31 DIAGNOSIS — N390 Urinary tract infection, site not specified: Secondary | ICD-10-CM | POA: Diagnosis not present

## 2021-12-31 LAB — CBC WITH DIFFERENTIAL/PLATELET
Basophils Absolute: 0.1 10*3/uL (ref 0.0–0.2)
Basos: 1 %
EOS (ABSOLUTE): 0.4 10*3/uL (ref 0.0–0.4)
Eos: 4 %
Hematocrit: 32 % — ABNORMAL LOW (ref 37.5–51.0)
Hemoglobin: 10.8 g/dL — ABNORMAL LOW (ref 13.0–17.7)
Immature Grans (Abs): 0 10*3/uL (ref 0.0–0.1)
Immature Granulocytes: 0 %
Lymphocytes Absolute: 3.7 10*3/uL — ABNORMAL HIGH (ref 0.7–3.1)
Lymphs: 31 %
MCH: 31.6 pg (ref 26.6–33.0)
MCHC: 33.8 g/dL (ref 31.5–35.7)
MCV: 94 fL (ref 79–97)
Monocytes Absolute: 0.8 10*3/uL (ref 0.1–0.9)
Monocytes: 7 %
Neutrophils Absolute: 6.8 10*3/uL (ref 1.4–7.0)
Neutrophils: 57 %
Platelets: 432 10*3/uL (ref 150–450)
RBC: 3.42 x10E6/uL — ABNORMAL LOW (ref 4.14–5.80)
RDW: 12.2 % (ref 11.6–15.4)
WBC: 11.8 10*3/uL — ABNORMAL HIGH (ref 3.4–10.8)

## 2021-12-31 LAB — URINALYSIS, ROUTINE W REFLEX MICROSCOPIC
Bilirubin, UA: NEGATIVE
Glucose, UA: NEGATIVE
Ketones, UA: NEGATIVE
Nitrite, UA: NEGATIVE
Specific Gravity, UA: 1.02 (ref 1.005–1.030)
Urobilinogen, Ur: 0.2 mg/dL (ref 0.2–1.0)
pH, UA: 5.5 (ref 5.0–7.5)

## 2021-12-31 LAB — CMP14+EGFR
ALT: 16 IU/L (ref 0–44)
AST: 25 IU/L (ref 0–40)
Albumin/Globulin Ratio: 0.7 — ABNORMAL LOW (ref 1.2–2.2)
Albumin: 2.8 g/dL — ABNORMAL LOW (ref 3.6–4.6)
Alkaline Phosphatase: 162 IU/L — ABNORMAL HIGH (ref 44–121)
BUN/Creatinine Ratio: 16 (ref 10–24)
BUN: 21 mg/dL (ref 8–27)
Bilirubin Total: 0.5 mg/dL (ref 0.0–1.2)
CO2: 26 mmol/L (ref 20–29)
Calcium: 8.4 mg/dL — ABNORMAL LOW (ref 8.6–10.2)
Chloride: 101 mmol/L (ref 96–106)
Creatinine, Ser: 1.29 mg/dL — ABNORMAL HIGH (ref 0.76–1.27)
Globulin, Total: 3.8 g/dL (ref 1.5–4.5)
Glucose: 190 mg/dL — ABNORMAL HIGH (ref 70–99)
Potassium: 4.5 mmol/L (ref 3.5–5.2)
Sodium: 139 mmol/L (ref 134–144)
Total Protein: 6.6 g/dL (ref 6.0–8.5)
eGFR: 54 mL/min/{1.73_m2} — ABNORMAL LOW (ref >59)

## 2021-12-31 LAB — MICROSCOPIC EXAMINATION
Epithelial Cells (non renal): NONE SEEN /hpf (ref 0–10)
Renal Epithel, UA: NONE SEEN /hpf
WBC, UA: >30 /hpf — AB (ref 0–5)

## 2022-01-01 ENCOUNTER — Telehealth: Payer: Self-pay | Admitting: Family Medicine

## 2022-01-01 DIAGNOSIS — I480 Paroxysmal atrial fibrillation: Secondary | ICD-10-CM | POA: Diagnosis not present

## 2022-01-01 DIAGNOSIS — N390 Urinary tract infection, site not specified: Secondary | ICD-10-CM | POA: Diagnosis not present

## 2022-01-01 DIAGNOSIS — C221 Intrahepatic bile duct carcinoma: Secondary | ICD-10-CM | POA: Diagnosis not present

## 2022-01-01 DIAGNOSIS — K831 Obstruction of bile duct: Secondary | ICD-10-CM | POA: Diagnosis not present

## 2022-01-01 DIAGNOSIS — I129 Hypertensive chronic kidney disease with stage 1 through stage 4 chronic kidney disease, or unspecified chronic kidney disease: Secondary | ICD-10-CM | POA: Diagnosis not present

## 2022-01-01 LAB — MICROALBUMIN / CREATININE URINE RATIO
Creatinine, Urine: 94 mg/dL
Microalb/Creat Ratio: 259 mg/g creat — ABNORMAL HIGH (ref 0–29)
Microalbumin, Urine: 243 ug/mL

## 2022-01-01 NOTE — Telephone Encounter (Signed)
Pt aware that we are waiting on culture

## 2022-01-02 LAB — URINE CULTURE

## 2022-01-02 MED ORDER — CIPROFLOXACIN HCL 500 MG PO TABS: 500.0000 mg | ORAL_TABLET | Freq: Two times a day (BID) | ORAL | 0 refills | Status: AC

## 2022-01-02 NOTE — Addendum Note (Signed)
Addended by: Baruch Gouty on: 01/02/2022 03:44 PM   Modules accepted: Orders

## 2022-01-05 DIAGNOSIS — I480 Paroxysmal atrial fibrillation: Secondary | ICD-10-CM | POA: Diagnosis not present

## 2022-01-05 DIAGNOSIS — I129 Hypertensive chronic kidney disease with stage 1 through stage 4 chronic kidney disease, or unspecified chronic kidney disease: Secondary | ICD-10-CM | POA: Diagnosis not present

## 2022-01-05 DIAGNOSIS — K831 Obstruction of bile duct: Secondary | ICD-10-CM | POA: Diagnosis not present

## 2022-01-05 DIAGNOSIS — N390 Urinary tract infection, site not specified: Secondary | ICD-10-CM | POA: Diagnosis not present

## 2022-01-05 DIAGNOSIS — C221 Intrahepatic bile duct carcinoma: Secondary | ICD-10-CM | POA: Diagnosis not present

## 2022-01-06 DIAGNOSIS — I480 Paroxysmal atrial fibrillation: Secondary | ICD-10-CM | POA: Diagnosis not present

## 2022-01-06 DIAGNOSIS — I129 Hypertensive chronic kidney disease with stage 1 through stage 4 chronic kidney disease, or unspecified chronic kidney disease: Secondary | ICD-10-CM | POA: Diagnosis not present

## 2022-01-06 DIAGNOSIS — N390 Urinary tract infection, site not specified: Secondary | ICD-10-CM | POA: Diagnosis not present

## 2022-01-06 DIAGNOSIS — K831 Obstruction of bile duct: Secondary | ICD-10-CM | POA: Diagnosis not present

## 2022-01-06 DIAGNOSIS — C221 Intrahepatic bile duct carcinoma: Secondary | ICD-10-CM | POA: Diagnosis not present

## 2022-01-08 DIAGNOSIS — N390 Urinary tract infection, site not specified: Secondary | ICD-10-CM | POA: Diagnosis not present

## 2022-01-08 DIAGNOSIS — C221 Intrahepatic bile duct carcinoma: Secondary | ICD-10-CM | POA: Diagnosis not present

## 2022-01-08 DIAGNOSIS — I129 Hypertensive chronic kidney disease with stage 1 through stage 4 chronic kidney disease, or unspecified chronic kidney disease: Secondary | ICD-10-CM | POA: Diagnosis not present

## 2022-01-08 DIAGNOSIS — K831 Obstruction of bile duct: Secondary | ICD-10-CM | POA: Diagnosis not present

## 2022-01-08 DIAGNOSIS — I480 Paroxysmal atrial fibrillation: Secondary | ICD-10-CM | POA: Diagnosis not present

## 2022-01-13 DIAGNOSIS — K831 Obstruction of bile duct: Secondary | ICD-10-CM | POA: Diagnosis not present

## 2022-01-13 DIAGNOSIS — N390 Urinary tract infection, site not specified: Secondary | ICD-10-CM | POA: Diagnosis not present

## 2022-01-13 DIAGNOSIS — I129 Hypertensive chronic kidney disease with stage 1 through stage 4 chronic kidney disease, or unspecified chronic kidney disease: Secondary | ICD-10-CM | POA: Diagnosis not present

## 2022-01-13 DIAGNOSIS — C221 Intrahepatic bile duct carcinoma: Secondary | ICD-10-CM | POA: Diagnosis not present

## 2022-01-13 DIAGNOSIS — I480 Paroxysmal atrial fibrillation: Secondary | ICD-10-CM | POA: Diagnosis not present

## 2022-01-14 ENCOUNTER — Other Ambulatory Visit: Payer: Self-pay | Admitting: Urology

## 2022-01-14 ENCOUNTER — Telehealth: Payer: Self-pay | Admitting: Cardiology

## 2022-01-14 DIAGNOSIS — N138 Other obstructive and reflux uropathy: Secondary | ICD-10-CM

## 2022-01-14 DIAGNOSIS — N390 Urinary tract infection, site not specified: Secondary | ICD-10-CM | POA: Diagnosis not present

## 2022-01-14 DIAGNOSIS — K831 Obstruction of bile duct: Secondary | ICD-10-CM | POA: Diagnosis not present

## 2022-01-14 DIAGNOSIS — C221 Intrahepatic bile duct carcinoma: Secondary | ICD-10-CM | POA: Diagnosis not present

## 2022-01-14 DIAGNOSIS — I129 Hypertensive chronic kidney disease with stage 1 through stage 4 chronic kidney disease, or unspecified chronic kidney disease: Secondary | ICD-10-CM | POA: Diagnosis not present

## 2022-01-14 DIAGNOSIS — I480 Paroxysmal atrial fibrillation: Secondary | ICD-10-CM | POA: Diagnosis not present

## 2022-01-14 DIAGNOSIS — N401 Enlarged prostate with lower urinary tract symptoms: Secondary | ICD-10-CM

## 2022-01-14 MED ORDER — METOPROLOL SUCCINATE ER 25 MG PO TB24: 37.5000 mg | ORAL_TABLET | Freq: Every day | ORAL | 5 refills | Status: AC

## 2022-01-14 NOTE — Telephone Encounter (Signed)
Returned call to patient!   Blood pressure taken every morning/ Blood pressure is staying above the 90/50 mark and he is not experiencing lightlessness or passing out!    "I would like to increase the metoprolol succinate from 12.5 mg (1/2 tab) daily to 25 mg daily. I would like to have the blood pressure stay more than 90/50 and I do not want lightheadedness or passing out.   If the heart rate is still above 90 at rest and there is still blood pressure room, after a week I would like to to increase to 37.5 mg (1.5 pills) of metoprolol daily.   We can continue to gradually increase this by 12.5 mg weekly as long as blood pressure stays stable."

## 2022-01-14 NOTE — Telephone Encounter (Signed)
°*  STAT* If patient is at the pharmacy, call can be transferred to refill team.   1. Which medications need to be refilled? (please list name of each medication and dose if known) metoprolol succinate (TOPROL-XL) 25 MG 24 hr tablet  2. Which pharmacy/location (including street and city if local pharmacy) is medication to be sent to?[place], [place]  3. Do they need a 30 day or 90 day supply? 90 day supply   Wife states pt is taking 1 and a 1/2 tablets daily. Pharmacy needs new prescription that advises patient to take it this way.

## 2022-01-14 NOTE — Telephone Encounter (Signed)
Pharmacy calling in to get instruction and that they need a prescription that says he suppose to take 1 and 1/2 tablet. Please advise

## 2022-01-19 DIAGNOSIS — K831 Obstruction of bile duct: Secondary | ICD-10-CM | POA: Diagnosis not present

## 2022-01-19 DIAGNOSIS — N390 Urinary tract infection, site not specified: Secondary | ICD-10-CM | POA: Diagnosis not present

## 2022-01-19 DIAGNOSIS — I129 Hypertensive chronic kidney disease with stage 1 through stage 4 chronic kidney disease, or unspecified chronic kidney disease: Secondary | ICD-10-CM | POA: Diagnosis not present

## 2022-01-19 DIAGNOSIS — I480 Paroxysmal atrial fibrillation: Secondary | ICD-10-CM | POA: Diagnosis not present

## 2022-01-19 DIAGNOSIS — C221 Intrahepatic bile duct carcinoma: Secondary | ICD-10-CM | POA: Diagnosis not present

## 2022-01-20 DIAGNOSIS — E440 Moderate protein-calorie malnutrition: Secondary | ICD-10-CM | POA: Diagnosis not present

## 2022-01-20 DIAGNOSIS — N390 Urinary tract infection, site not specified: Secondary | ICD-10-CM | POA: Diagnosis not present

## 2022-01-20 DIAGNOSIS — J90 Pleural effusion, not elsewhere classified: Secondary | ICD-10-CM | POA: Diagnosis not present

## 2022-01-23 DIAGNOSIS — C221 Intrahepatic bile duct carcinoma: Secondary | ICD-10-CM | POA: Diagnosis not present

## 2022-01-23 DIAGNOSIS — I129 Hypertensive chronic kidney disease with stage 1 through stage 4 chronic kidney disease, or unspecified chronic kidney disease: Secondary | ICD-10-CM | POA: Diagnosis not present

## 2022-01-23 DIAGNOSIS — N390 Urinary tract infection, site not specified: Secondary | ICD-10-CM | POA: Diagnosis not present

## 2022-01-23 DIAGNOSIS — I480 Paroxysmal atrial fibrillation: Secondary | ICD-10-CM | POA: Diagnosis not present

## 2022-01-23 DIAGNOSIS — K831 Obstruction of bile duct: Secondary | ICD-10-CM | POA: Diagnosis not present

## 2022-01-27 DIAGNOSIS — I480 Paroxysmal atrial fibrillation: Secondary | ICD-10-CM | POA: Diagnosis not present

## 2022-01-27 DIAGNOSIS — K831 Obstruction of bile duct: Secondary | ICD-10-CM | POA: Diagnosis not present

## 2022-01-27 DIAGNOSIS — C221 Intrahepatic bile duct carcinoma: Secondary | ICD-10-CM | POA: Diagnosis not present

## 2022-01-27 DIAGNOSIS — N390 Urinary tract infection, site not specified: Secondary | ICD-10-CM | POA: Diagnosis not present

## 2022-01-27 DIAGNOSIS — I129 Hypertensive chronic kidney disease with stage 1 through stage 4 chronic kidney disease, or unspecified chronic kidney disease: Secondary | ICD-10-CM | POA: Diagnosis not present

## 2022-01-29 DIAGNOSIS — C221 Intrahepatic bile duct carcinoma: Secondary | ICD-10-CM | POA: Diagnosis not present

## 2022-01-29 DIAGNOSIS — K831 Obstruction of bile duct: Secondary | ICD-10-CM | POA: Diagnosis not present

## 2022-01-29 DIAGNOSIS — I480 Paroxysmal atrial fibrillation: Secondary | ICD-10-CM | POA: Diagnosis not present

## 2022-01-29 DIAGNOSIS — I129 Hypertensive chronic kidney disease with stage 1 through stage 4 chronic kidney disease, or unspecified chronic kidney disease: Secondary | ICD-10-CM | POA: Diagnosis not present

## 2022-01-29 DIAGNOSIS — N390 Urinary tract infection, site not specified: Secondary | ICD-10-CM | POA: Diagnosis not present

## 2022-02-02 ENCOUNTER — Other Ambulatory Visit: Payer: Self-pay

## 2022-02-02 ENCOUNTER — Ambulatory Visit (HOSPITAL_BASED_OUTPATIENT_CLINIC_OR_DEPARTMENT_OTHER): Payer: Medicare Other | Admitting: Cardiology

## 2022-02-02 ENCOUNTER — Encounter (HOSPITAL_BASED_OUTPATIENT_CLINIC_OR_DEPARTMENT_OTHER): Payer: Self-pay | Admitting: Cardiology

## 2022-02-02 VITALS — BP 98/64 | HR 88 | Ht 71.0 in | Wt 142.1 lb

## 2022-02-02 DIAGNOSIS — C801 Malignant (primary) neoplasm, unspecified: Secondary | ICD-10-CM | POA: Diagnosis not present

## 2022-02-02 DIAGNOSIS — I700 Atherosclerosis of aorta: Secondary | ICD-10-CM

## 2022-02-02 DIAGNOSIS — I480 Paroxysmal atrial fibrillation: Secondary | ICD-10-CM | POA: Diagnosis not present

## 2022-02-02 DIAGNOSIS — I10 Essential (primary) hypertension: Secondary | ICD-10-CM

## 2022-02-02 DIAGNOSIS — I959 Hypotension, unspecified: Secondary | ICD-10-CM

## 2022-02-02 NOTE — Progress Notes (Signed)
Cardiology Office Note:    Date:  02/02/2022   ID:  ZUBIN PONTILLO, DOB [birth year], MRN [id number]  PCP:  [name], FNP  Cardiologist:  Buford Dresser, MD  Referring MD: [name], FNP   CC: follow up  History of Present Illness:    Terry Hernandez is a 88 y.o. male with a hx of paroxysmal atrial fibrillation, hypertension, hyperlipidemia, GERD, pneumonia, renal calculi, and arthritis, who is seen for follow-up. I initially met him 12/24/2021 as a new consult at the request of [name], FNP for the evaluation and management of irregular heart rate. Previously followed by [facility].   Today: He is accompanied by his son. Overall, he is feeling okay, and his son reports he is feeling a lot better. He believes his improvement has been gradual.  He continues to work with PT at home. His ambulation has improved. At [employer] he was able to walk with a cart and shop for his own groceries. He has been walking with crutches to the mailbox. He complains of some shoulder pain, but they believe this is attributable to his increased activity.  This morning his blood pressure was 127/71 at home. He denies any dizziness or syncope.  Occasionally he notices some mild swelling in his ankles.  Generally he is also is eating better, and has gained about 6 lbs since has last visit.  He denies any palpitations, chest pain, or shortness of breath. No headaches, orthopnea, or PND.   Past Medical History:  Diagnosis Date   Arthritis    GERD (gastroesophageal reflux disease)    PMH   Glucose intolerance (impaired glucose tolerance)    High cholesterol    History of kidney stones    HTN (hypertension)    Hyperlipidemia    Lumbar stenosis with neurogenic claudication    Pneumonia    Subcutaneous mass    Wears glasses     Past Surgical History:  Procedure Laterality Date   APPENDECTOMY     BILIARY BRUSHING  10/26/2021   Procedure: BILIARY BRUSHING;  Surgeon:  Clarene Essex, MD;  Location: [facility] ENDOSCOPY;  Service: Endoscopy;;   BILIARY STENT PLACEMENT  10/26/2021   Procedure: BILIARY STENT PLACEMENT;  Surgeon: Clarene Essex, MD;  Location: [facility] ENDOSCOPY;  Service: Endoscopy;;   BILIARY STENT PLACEMENT  10/30/2021   Procedure: BILIARY STENT PLACEMENT;  Surgeon: Clarene Essex, MD;  Location: [place];  Service: Endoscopy;;   COLONOSCOPY W/ BIOPSIES AND POLYPECTOMY     ERCP N/A 10/26/2021   Procedure: ENDOSCOPIC RETROGRADE CHOLANGIOPANCREATOGRAPHY (ERCP);  Surgeon: Clarene Essex, MD;  Location: [place];  Service: Endoscopy;  Laterality: N/A;   ERCP N/A 10/30/2021   Procedure: ENDOSCOPIC RETROGRADE CHOLANGIOPANCREATOGRAPHY (ERCP);  Surgeon: Clarene Essex, MD;  Location: [place];  Service: Endoscopy;  Laterality: N/A;   ESOPHAGOGASTRODUODENOSCOPY N/A 10/26/2021   Procedure: ESOPHAGOGASTRODUODENOSCOPY (EGD);  Surgeon: Clarene Essex, MD;  Location: [place];  Service: Endoscopy;  Laterality: N/A;   LUMBAR LAMINECTOMY/DECOMPRESSION MICRODISCECTOMY Bilateral 03/18/2020   Procedure: Bilateral Lumbar three-four Lumbar four-five Laminotomy/foraminotomy;  Surgeon: Kristeen Miss, MD;  Location: [place];  Service: Neurosurgery;  Laterality: Bilateral;   MASS EXCISION Right 03/18/2020   Procedure: Excision of a Right gluteal subcutaneous mass;  Surgeon: Kristeen Miss, MD;  Location: [place];  Service: Neurosurgery;  Laterality: Right;   MULTIPLE TOOTH EXTRACTIONS     SPHINCTEROTOMY  10/26/2021   Procedure: SPHINCTEROTOMY;  Surgeon: Clarene Essex, MD;  Location: [place];  Service: Endoscopy;;  STENT REMOVAL  10/30/2021   Procedure: STENT REMOVAL;  Surgeon: Clarene Essex, MD;  Location: [facility] ENDOSCOPY;  Service: Endoscopy;;    Current Medications: Current Outpatient Medications on File Prior to Visit  Medication Sig   feeding supplement (ENSURE ENLIVE / ENSURE PLUS) LIQD Take 237 mLs by mouth 3 (three) times daily between meals.   LORazepam (ATIVAN) 1 MG tablet Take  1 tablet (1 mg total) by mouth every 12 (twelve) hours as needed for anxiety or sleep.   methocarbamol (ROBAXIN) 500 MG tablet Take 1 tablet (500 mg total) by mouth every 6 (six) hours as needed for muscle spasms.   metoprolol succinate (TOPROL-XL) 25 MG 24 hr tablet Take 1.5 tablets (37.5 mg total) by mouth daily.   mirtazapine (REMERON SOL-TAB) 15 MG disintegrating tablet Take 0.5 tablets (7.5 mg total) by mouth at bedtime.   Multiple Vitamin (MULTIVITAMIN) tablet Take 1 tablet by mouth at bedtime.    phenazopyridine (PYRIDIUM) 200 MG tablet Take 1 tablet (200 mg total) by mouth 3 (three) times daily as needed for pain.   senna-docusate (SENOKOT-S) 8.6-50 MG tablet Take 2 tablets by mouth at bedtime.   silodosin (RAPAFLO) 4 MG CAPS capsule TAKE 1 CAPSULE BY MOUTH EVERY MORNING WITH BREAKFAST   pantoprazole (PROTONIX) 40 MG tablet Take 1 tablet (40 mg total) by mouth daily.   No current facility-administered medications on file prior to visit.     Allergies:   Cephalexin   Social History   Tobacco Use   Smoking status: Never   Smokeless tobacco: Never  Vaping Use   Vaping Use: Never used  Substance Use Topics   Alcohol use: Never   Drug use: Never    Family History: family history includes Cancer in his mother and son; Stroke in his father.  ROS:   Please see the history of present illness. (+) Shoulder pain (+) Easy bruising All other systems are reviewed and negative.    EKGs/Labs/Other Studies Reviewed:    The following studies were reviewed today:  Echo 11/15/2021:  1. Left ventricular ejection fraction, by estimation, is 65 to 70%. The  left ventricle has normal function. The left ventricle has no regional  wall motion abnormalities. There is moderate left ventricular hypertrophy.  Left ventricular diastolic  parameters are indeterminate.   2. Right ventricular systolic function is normal. The right ventricular  size is normal.   3. The mitral valve is  myxomatous. Trivial mitral valve regurgitation.   4. The aortic valve is tricuspid. Aortic valve regurgitation is not  visualized. Aortic valve sclerosis is present, with no evidence of aortic  valve stenosis.   5. The inferior vena cava is normal in size with greater than 50%  respiratory variability, suggesting right atrial pressure of 3 mmHg.   CTA Chest 11/14/2021: FINDINGS: Cardiovascular: Contrast injection is sufficient to demonstrate satisfactory opacification of the pulmonary arteries to the segmental level. There is no pulmonary embolus or evidence of right heart strain. The size of the main pulmonary artery is normal. Heart size is normal, with no pericardial effusion. The course and caliber of the aorta are normal. There is atherosclerotic calcification. No acute aortic syndrome.   Mediastinum/Nodes: No mediastinal, hilar or axillary lymphadenopathy. Normal visualized thyroid. Thoracic esophageal course is normal.   Lungs/Pleura: Small right pleural effusion. Interstitial thickening within the right upper lobe. Bibasilar atelectasis.   Upper Abdomen: Contrast bolus timing is not optimized for evaluation of the abdominal organs. Pneumobilia. There is a stent in the common  bile duct, incompletely visualized.   Musculoskeletal: No chest wall abnormality. No bony spinal canal stenosis.   Review of the MIP images confirms the above findings.   IMPRESSION: 1. No pulmonary embolus or acute aortic syndrome. 2. Small right pleural effusion and mild pulmonary edema.   Aortic Atherosclerosis (ICD10-I70.0).  EKG:  EKG is personally reviewed.   02/02/2022: NSR at 88 bpm, sinus arrhythmia 12/24/2021: afib rvr at 124 bpm  Recent Labs: 11/15/2021: Magnesium 1.7 12/30/2021: ALT 16; BUN 21; Creatinine, Ser 1.29; Hemoglobin 10.8; Platelets 432; Potassium 4.5; Sodium 139   Recent Lipid Panel    Component Value Date/Time   CHOL 285 (H) 10/25/2021 0452   TRIG 124 10/25/2021 0452    HDL <10 (L) 10/25/2021 0452   CHOLHDL NOT CALCULATED 10/25/2021 0452   VLDL 25 10/25/2021 0452   LDLCALC NOT CALCULATED 10/25/2021 0452    Physical Exam:    VS:  BP 98/64 (BP Location: Left Arm, Patient Position: Sitting, Cuff Size: Normal)    Pulse 88    Ht 5' 11" (1.803 m)    Wt 142 lb 1.6 oz (64.5 kg)    BMI 19.82 kg/m     Wt Readings from Last 3 Encounters:  02/02/22 142 lb 1.6 oz (64.5 kg)  12/30/21 136 lb (61.7 kg)  12/24/21 136 lb (61.7 kg)    GEN: Well nourished, well developed in no acute distress HEENT: Normal, moist mucous membranes NECK: No JVD CARDIAC: regular rhythm, normal S1 and S2, no rubs or gallops. No murmur. VASCULAR: Radial and DP pulses 2+ bilaterally. No carotid bruits RESPIRATORY:  Clear to auscultation without rales, wheezing or rhonchi  ABDOMEN: Soft, non-tender, non-distended MUSCULOSKELETAL:  Ambulates independently.  SKIN: Warm and dry, no edema NEUROLOGIC:  Alert and oriented x 3. No focal neuro deficits noted. PSYCHIATRIC:  Normal affect    ASSESSMENT:    1. Paroxysmal atrial fibrillation (HCC)   2. Essential hypertension   3. Hypotension, unspecified hypotension type   4. Adenocarcinoma ([place])   5. Aortic atherosclerosis (HCC)    PLAN:    Atrial fibrillation, paroxysmal  -previously followed by [facility] cardiology, seen 12/23/20 -CHA2DS2/VAS Stroke Risk Points= 5  -s/p biliary stent placement, initially plastic then metal, with brushings consistent with adenocarcinoma. Has also had melena and anemia requiring previous transfusions. Discussed risk/benefit previously, will not start anticoagulation at this time, he does not want aggressive interventions -uptitrated metoprolol succinate to 37.5 mg daily, and he is in sinus rhythm with sinus arrhythmia today   Hypertension, now running more hypotensive -stable on current dose of metoprolol     Adenocarcinoma, unspecified source -seen by Dr. Benay Spice 11/11/21 -no plans for surgery or treatment  per patient  Aortic atherosclerosis: -seen on imaging. Given prior LFT abnormalities, age, will not start treatment with statin  Plan for follow up: 6 months or sooner as needed.  Buford Dresser, MD, PhD, [facility]    Medication Adjustments/Labs and Tests Ordered: Current medicines are reviewed at length with the patient today.  Concerns regarding medicines are outlined above.   Orders Placed This Encounter  Procedures   EKG 12-Lead   No orders of the defined types were placed in this encounter.  Patient Instructions  Medication Instructions:  Your Physician recommend you continue on your current medication as directed.    *If you need a refill on your cardiac medications before your next appointment, please call your pharmacy*   Follow-Up: At [facility], you and your health  needs are our priority.  As part of our continuing mission to provide you with exceptional heart care, we have created designated Provider Care Teams.  These Care Teams include your primary Cardiologist (physician) and Advanced Practice Providers (APPs -  Physician Assistants and Nurse Practitioners) who all work together to provide you with the care you need, when you need it.  We recommend signing up for the patient portal called "MyChart".  Sign up information is provided on this After Visit Summary.  MyChart is used to connect with patients for Virtual Visits (Telemedicine).  Patients are able to view lab/test results, encounter notes, upcoming appointments, etc.  Non-urgent messages can be sent to your provider as well.   To learn more about what you can do with MyChart, go to [email or web address].    Your next appointment:   6 month(s)  The format for your next appointment:   In Person  Provider:   Buford Dresser, MD{    I,Mathew Stumpf,acting as a scribe for Buford Dresser, MD.,have documented all relevant documentation on the behalf of Buford Dresser, MD,as directed by  Buford Dresser, MD while in the presence of Buford Dresser, MD.  I, Buford Dresser, MD, have reviewed all documentation for this visit. The documentation on 02/02/22 for the exam, diagnosis, procedures, and orders are all accurate and complete.   Signed, Buford Dresser, MD PhD 02/02/2022 6:40 PM    [place]

## 2022-02-02 NOTE — Patient Instructions (Signed)
Medication Instructions:  Your Physician recommend you continue on your current medication as directed.    *If you need a refill on your cardiac medications before your next appointment, please call your pharmacy*   Follow-Up: At [facility], you and your health needs are our priority.  As part of our continuing mission to provide you with exceptional heart care, we have created designated Provider Care Teams.  These Care Teams include your primary Cardiologist (physician) and Advanced Practice Providers (APPs -  Physician Assistants and Nurse Practitioners) who all work together to provide you with the care you need, when you need it.  We recommend signing up for the patient portal called "MyChart".  Sign up information is provided on this After Visit Summary.  MyChart is used to connect with patients for Virtual Visits (Telemedicine).  Patients are able to view lab/test results, encounter notes, upcoming appointments, etc.  Non-urgent messages can be sent to your provider as well.   To learn more about what you can do with MyChart, go to [email or web address].    Your next appointment:   6 month(s)  The format for your next appointment:   In Person  Provider:   Buford Dresser, MD{

## 2022-02-04 DIAGNOSIS — I480 Paroxysmal atrial fibrillation: Secondary | ICD-10-CM | POA: Diagnosis not present

## 2022-02-04 DIAGNOSIS — C221 Intrahepatic bile duct carcinoma: Secondary | ICD-10-CM | POA: Diagnosis not present

## 2022-02-04 DIAGNOSIS — I129 Hypertensive chronic kidney disease with stage 1 through stage 4 chronic kidney disease, or unspecified chronic kidney disease: Secondary | ICD-10-CM | POA: Diagnosis not present

## 2022-02-04 DIAGNOSIS — N390 Urinary tract infection, site not specified: Secondary | ICD-10-CM | POA: Diagnosis not present

## 2022-02-04 DIAGNOSIS — K831 Obstruction of bile duct: Secondary | ICD-10-CM | POA: Diagnosis not present

## 2022-02-06 DIAGNOSIS — K831 Obstruction of bile duct: Secondary | ICD-10-CM | POA: Diagnosis not present

## 2022-02-06 DIAGNOSIS — I480 Paroxysmal atrial fibrillation: Secondary | ICD-10-CM | POA: Diagnosis not present

## 2022-02-06 DIAGNOSIS — I129 Hypertensive chronic kidney disease with stage 1 through stage 4 chronic kidney disease, or unspecified chronic kidney disease: Secondary | ICD-10-CM | POA: Diagnosis not present

## 2022-02-06 DIAGNOSIS — N390 Urinary tract infection, site not specified: Secondary | ICD-10-CM | POA: Diagnosis not present

## 2022-02-06 DIAGNOSIS — C221 Intrahepatic bile duct carcinoma: Secondary | ICD-10-CM | POA: Diagnosis not present

## 2022-02-12 ENCOUNTER — Other Ambulatory Visit: Payer: Self-pay | Admitting: Urology

## 2022-02-12 DIAGNOSIS — N138 Other obstructive and reflux uropathy: Secondary | ICD-10-CM

## 2022-02-12 DIAGNOSIS — I129 Hypertensive chronic kidney disease with stage 1 through stage 4 chronic kidney disease, or unspecified chronic kidney disease: Secondary | ICD-10-CM | POA: Diagnosis not present

## 2022-02-12 DIAGNOSIS — K831 Obstruction of bile duct: Secondary | ICD-10-CM | POA: Diagnosis not present

## 2022-02-12 DIAGNOSIS — N390 Urinary tract infection, site not specified: Secondary | ICD-10-CM | POA: Diagnosis not present

## 2022-02-12 DIAGNOSIS — N401 Enlarged prostate with lower urinary tract symptoms: Secondary | ICD-10-CM

## 2022-02-12 DIAGNOSIS — C221 Intrahepatic bile duct carcinoma: Secondary | ICD-10-CM | POA: Diagnosis not present

## 2022-02-12 DIAGNOSIS — I480 Paroxysmal atrial fibrillation: Secondary | ICD-10-CM | POA: Diagnosis not present

## 2022-02-17 DIAGNOSIS — N390 Urinary tract infection, site not specified: Secondary | ICD-10-CM | POA: Diagnosis not present

## 2022-02-17 DIAGNOSIS — E440 Moderate protein-calorie malnutrition: Secondary | ICD-10-CM | POA: Diagnosis not present

## 2022-02-17 DIAGNOSIS — J90 Pleural effusion, not elsewhere classified: Secondary | ICD-10-CM | POA: Diagnosis not present

## 2022-02-17 DIAGNOSIS — L03032 Cellulitis of left toe: Secondary | ICD-10-CM | POA: Diagnosis not present

## 2022-02-19 DIAGNOSIS — I480 Paroxysmal atrial fibrillation: Secondary | ICD-10-CM | POA: Diagnosis not present

## 2022-02-19 DIAGNOSIS — N390 Urinary tract infection, site not specified: Secondary | ICD-10-CM | POA: Diagnosis not present

## 2022-02-19 DIAGNOSIS — C221 Intrahepatic bile duct carcinoma: Secondary | ICD-10-CM | POA: Diagnosis not present

## 2022-02-19 DIAGNOSIS — K831 Obstruction of bile duct: Secondary | ICD-10-CM | POA: Diagnosis not present

## 2022-02-19 DIAGNOSIS — I129 Hypertensive chronic kidney disease with stage 1 through stage 4 chronic kidney disease, or unspecified chronic kidney disease: Secondary | ICD-10-CM | POA: Diagnosis not present

## 2022-02-20 DIAGNOSIS — Z515 Encounter for palliative care: Secondary | ICD-10-CM | POA: Diagnosis not present

## 2022-02-20 DIAGNOSIS — C249 Malignant neoplasm of biliary tract, unspecified: Secondary | ICD-10-CM | POA: Diagnosis not present

## 2022-03-03 DIAGNOSIS — M79676 Pain in unspecified toe(s): Secondary | ICD-10-CM | POA: Diagnosis not present

## 2022-03-03 DIAGNOSIS — L03032 Cellulitis of left toe: Secondary | ICD-10-CM | POA: Diagnosis not present

## 2022-03-03 IMAGING — CR DG LUMBAR SPINE 2-3V
1 series · 1 of 1 positions shown · non-contrast
Comparison: 03/13/2020.

CLINICAL DATA: Localization for L3-5 laminotomy.

EXAM:
LUMBAR SPINE - 2-3 VIEW

[xtable lateral]
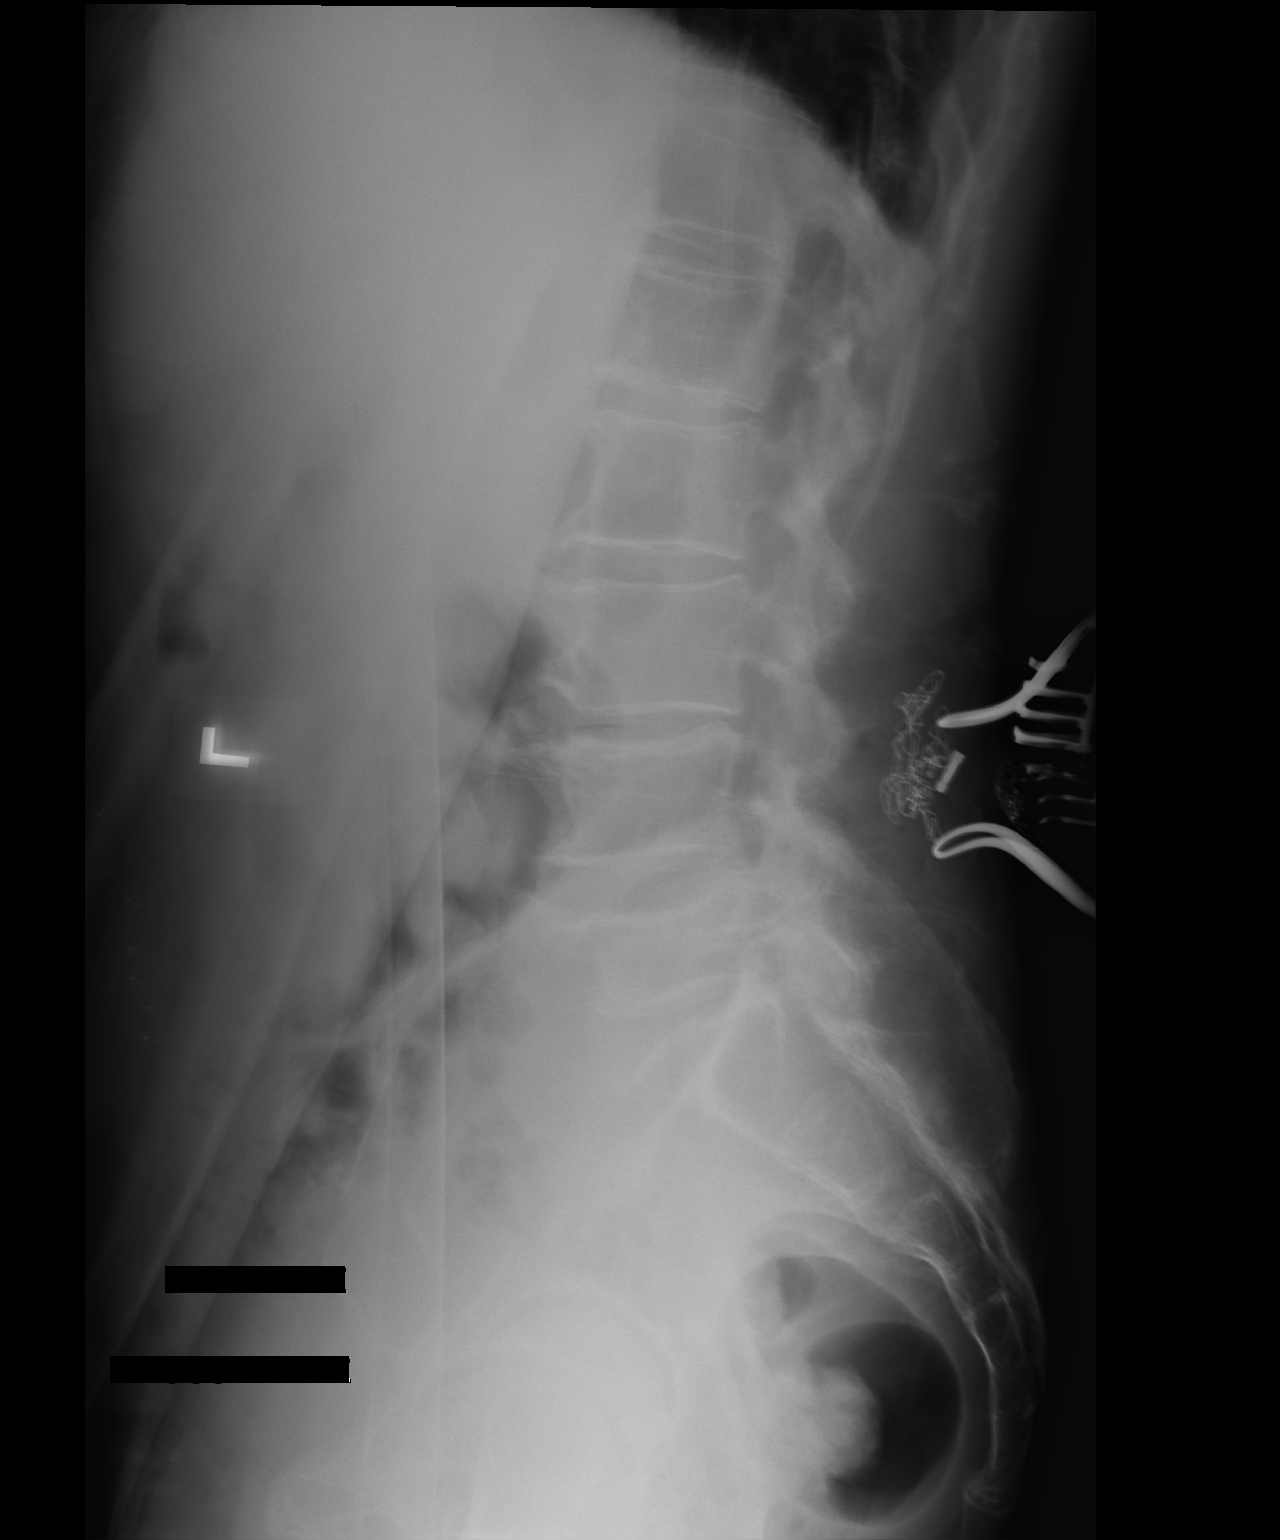

[1 of 1 positions shown; findings below may reference images not displayed]

FINDINGS: A single intraoperative cross-table lateral view of the lumbar spine
shows retractors centered at the L4 level in the posterior soft
tissues. There is an L5 superior endplate compression fracture.
Multilevel endplate degenerative changes are seen as well.
IMPRESSION: Intraoperative localization at L4.

## 2022-03-04 ENCOUNTER — Ambulatory Visit (INDEPENDENT_AMBULATORY_CARE_PROVIDER_SITE_OTHER): Payer: Medicare Other

## 2022-03-04 ENCOUNTER — Other Ambulatory Visit: Payer: Self-pay

## 2022-03-04 DIAGNOSIS — J9611 Chronic respiratory failure with hypoxia: Secondary | ICD-10-CM | POA: Diagnosis not present

## 2022-03-04 DIAGNOSIS — N401 Enlarged prostate with lower urinary tract symptoms: Secondary | ICD-10-CM

## 2022-03-04 DIAGNOSIS — B9620 Unspecified Escherichia coli [E. coli] as the cause of diseases classified elsewhere: Secondary | ICD-10-CM | POA: Diagnosis not present

## 2022-03-04 DIAGNOSIS — R32 Unspecified urinary incontinence: Secondary | ICD-10-CM

## 2022-03-04 DIAGNOSIS — D509 Iron deficiency anemia, unspecified: Secondary | ICD-10-CM

## 2022-03-04 DIAGNOSIS — I480 Paroxysmal atrial fibrillation: Secondary | ICD-10-CM | POA: Diagnosis not present

## 2022-03-04 DIAGNOSIS — M48062 Spinal stenosis, lumbar region with neurogenic claudication: Secondary | ICD-10-CM | POA: Diagnosis not present

## 2022-03-04 DIAGNOSIS — N183 Chronic kidney disease, stage 3 unspecified: Secondary | ICD-10-CM

## 2022-03-04 DIAGNOSIS — E46 Unspecified protein-calorie malnutrition: Secondary | ICD-10-CM

## 2022-03-04 DIAGNOSIS — I129 Hypertensive chronic kidney disease with stage 1 through stage 4 chronic kidney disease, or unspecified chronic kidney disease: Secondary | ICD-10-CM | POA: Diagnosis not present

## 2022-03-04 DIAGNOSIS — C221 Intrahepatic bile duct carcinoma: Secondary | ICD-10-CM | POA: Diagnosis not present

## 2022-03-04 DIAGNOSIS — N139 Obstructive and reflux uropathy, unspecified: Secondary | ICD-10-CM

## 2022-03-04 DIAGNOSIS — K831 Obstruction of bile duct: Secondary | ICD-10-CM | POA: Diagnosis not present

## 2022-03-04 DIAGNOSIS — N179 Acute kidney failure, unspecified: Secondary | ICD-10-CM

## 2022-03-04 DIAGNOSIS — N390 Urinary tract infection, site not specified: Secondary | ICD-10-CM

## 2022-03-04 DIAGNOSIS — R7303 Prediabetes: Secondary | ICD-10-CM

## 2022-03-16 ENCOUNTER — Telehealth: Payer: Self-pay | Admitting: Family Medicine

## 2022-03-16 ENCOUNTER — Encounter: Payer: Self-pay | Admitting: *Deleted

## 2022-03-16 NOTE — Telephone Encounter (Signed)
Pts wife called stating that pt has an oxygen tank from [employer] that pt no longer uses and has tried several times to contact [place] to get them to come and pick up the oxygen tank but has had no luck. ?

## 2022-03-16 NOTE — Progress Notes (Signed)
PATIENT NAVIGATOR PROGRESS NOTE ? ?Name: DEIGO ALONSO ?Date: 03/16/2022 ?MRN: [id number]  ?DOB: [birth year] ? ? ?Reason for visit:  ?F/U  ? ?Comments:  Spoke with son [name] and he states that his father is doing well overall, has been working with PT and is stronger, good appetite and gaining weight.  Had F/U with cardiologist last month and that he is no longer experiencing Afib.   ?Offered F/U with Dr Benay Spice and [name] is going to talk with his father and call back if they want to come in for visit. Gave contact information ? ? ? ?Time spent counseling/coordinating care: 30-45 minutes ? ?

## 2022-03-17 ENCOUNTER — Other Ambulatory Visit: Payer: Self-pay | Admitting: Family Medicine

## 2022-03-17 NOTE — Telephone Encounter (Signed)
Is it ok to write letter to D/C oxygen?  ?

## 2022-03-18 NOTE — Telephone Encounter (Signed)
Letter written and printed for Tiffany to sign. Pt's wife is aware that we will send the letter to [employer] to discontinue. ?

## 2022-03-18 NOTE — Telephone Encounter (Signed)
Ok to write letter to d/c oxygen ?

## 2022-03-19 DIAGNOSIS — L03032 Cellulitis of left toe: Secondary | ICD-10-CM | POA: Diagnosis not present

## 2022-03-20 DIAGNOSIS — N390 Urinary tract infection, site not specified: Secondary | ICD-10-CM | POA: Diagnosis not present

## 2022-03-20 DIAGNOSIS — J90 Pleural effusion, not elsewhere classified: Secondary | ICD-10-CM | POA: Diagnosis not present

## 2022-03-20 DIAGNOSIS — E440 Moderate protein-calorie malnutrition: Secondary | ICD-10-CM | POA: Diagnosis not present

## 2022-03-23 DIAGNOSIS — Z515 Encounter for palliative care: Secondary | ICD-10-CM | POA: Diagnosis not present

## 2022-03-23 DIAGNOSIS — C249 Malignant neoplasm of biliary tract, unspecified: Secondary | ICD-10-CM | POA: Diagnosis not present

## 2022-03-30 ENCOUNTER — Ambulatory Visit (INDEPENDENT_AMBULATORY_CARE_PROVIDER_SITE_OTHER): Payer: Medicare Other | Admitting: Family Medicine

## 2022-03-30 ENCOUNTER — Encounter: Payer: Self-pay | Admitting: Family Medicine

## 2022-03-30 VITALS — BP 121/70 | HR 101 | Temp 99.4°F | Ht 71.0 in | Wt 152.2 lb

## 2022-03-30 DIAGNOSIS — I10 Essential (primary) hypertension: Secondary | ICD-10-CM | POA: Diagnosis not present

## 2022-03-30 DIAGNOSIS — C801 Malignant (primary) neoplasm, unspecified: Secondary | ICD-10-CM

## 2022-03-30 DIAGNOSIS — R748 Abnormal levels of other serum enzymes: Secondary | ICD-10-CM

## 2022-03-30 DIAGNOSIS — R509 Fever, unspecified: Secondary | ICD-10-CM

## 2022-03-30 DIAGNOSIS — R531 Weakness: Secondary | ICD-10-CM

## 2022-03-30 DIAGNOSIS — I480 Paroxysmal atrial fibrillation: Secondary | ICD-10-CM

## 2022-03-30 NOTE — Progress Notes (Signed)
? ?Established Patient Office Visit ? ?Subjective:  ?Patient ID: Terry Hernandez, male    DOB: 04-Sep-1934  Age: 88 y.o. MRN: [id number] ? ?CC:  ?Chief Complaint  ?Patient presents with  ? Medical Management of Chronic Issues  ? ? ?HPI ?Here with son today. [name] presents for chronic follow up. He reports doing well. He feels good overall. He has now completed PT and is getting around much better. He takes a walk to the mailbox and back, about 0.25 miles a day. He is using a walker for ambulation. His appetite has improved. He is continuing to drink boosts as well. He recently saw cardiology and will follow up again in 6 months.  ? ?He did spike a fever of 104 (temporal) last night. His wife tried to get him to go the ED, but he refused. His temp today has been 99. He has been taking tylenol. He denies cough, congestion, sore throat, chest pain, shortness of breath, urinary symptoms, flank pain, abdominal pain, nausea, vomiting, or diarrhea. Denies erythema or swelling of joints.  ? ? ?Past Medical History:  ?Diagnosis Date  ? Arthritis   ? GERD (gastroesophageal reflux disease)   ? PMH  ? Glucose intolerance (impaired glucose tolerance)   ? High cholesterol   ? History of kidney stones   ? HTN (hypertension)   ? Hyperlipidemia   ? Lumbar stenosis with neurogenic claudication   ? Pneumonia   ? Subcutaneous mass   ? Wears glasses   ? ? ?Past Surgical History:  ?Procedure Laterality Date  ? APPENDECTOMY    ? BILIARY BRUSHING  10/26/2021  ? Procedure: BILIARY BRUSHING;  Surgeon: Clarene Essex, MD;  Location: [facility] ENDOSCOPY;  Service: Endoscopy;;  ? BILIARY STENT PLACEMENT  10/26/2021  ? Procedure: BILIARY STENT PLACEMENT;  Surgeon: Clarene Essex, MD;  Location: [facility] ENDOSCOPY;  Service: Endoscopy;;  ? BILIARY STENT PLACEMENT  10/30/2021  ? Procedure: BILIARY STENT PLACEMENT;  Surgeon: Clarene Essex, MD;  Location: [place];  Service: Endoscopy;;  ? COLONOSCOPY W/ BIOPSIES AND POLYPECTOMY    ? ERCP N/A 10/26/2021  ?  Procedure: ENDOSCOPIC RETROGRADE CHOLANGIOPANCREATOGRAPHY (ERCP);  Surgeon: Clarene Essex, MD;  Location: [place];  Service: Endoscopy;  Laterality: N/A;  ? ERCP N/A 10/30/2021  ? Procedure: ENDOSCOPIC RETROGRADE CHOLANGIOPANCREATOGRAPHY (ERCP);  Surgeon: Clarene Essex, MD;  Location: [place];  Service: Endoscopy;  Laterality: N/A;  ? ESOPHAGOGASTRODUODENOSCOPY N/A 10/26/2021  ? Procedure: ESOPHAGOGASTRODUODENOSCOPY (EGD);  Surgeon: Clarene Essex, MD;  Location: [place];  Service: Endoscopy;  Laterality: N/A;  ? LUMBAR LAMINECTOMY/DECOMPRESSION MICRODISCECTOMY Bilateral 03/18/2020  ? Procedure: Bilateral Lumbar three-four Lumbar four-five Laminotomy/foraminotomy;  Surgeon: Kristeen Miss, MD;  Location: [place];  Service: Neurosurgery;  Laterality: Bilateral;  ? MASS EXCISION Right 03/18/2020  ? Procedure: Excision of a Right gluteal subcutaneous mass;  Surgeon: Kristeen Miss, MD;  Location: [place];  Service: Neurosurgery;  Laterality: Right;  ? MULTIPLE TOOTH EXTRACTIONS    ? SPHINCTEROTOMY  10/26/2021  ? Procedure: SPHINCTEROTOMY;  Surgeon: Clarene Essex, MD;  Location: [facility] ENDOSCOPY;  Service: Endoscopy;;  ? STENT REMOVAL  10/30/2021  ? Procedure: STENT REMOVAL;  Surgeon: Clarene Essex, MD;  Location: [facility] ENDOSCOPY;  Service: Endoscopy;;  ? ? ?Family History  ?Problem Relation Age of Onset  ? Cancer Mother   ? Stroke Father   ? Cancer Son   ?     lung  ? ? ?Social History  ? ?Socioeconomic History  ? Marital status: Married  ?  Spouse name:  Not on file  ? Number of children: 2  ? Years of education: Not on file  ? Highest education level: Not on file  ?Occupational History  ? Not on file  ?Tobacco Use  ? Smoking status: Never  ? Smokeless tobacco: Never  ?Vaping Use  ? Vaping Use: Never used  ?Substance and Sexual Activity  ? Alcohol use: Never  ? Drug use: Never  ? Sexual activity: Not Currently  ?Other Topics Concern  ? Not on file  ?Social History Narrative  ? Not on file  ? ?Social Determinants of Health   ? ?Financial Resource Strain: Not on file  ?Food Insecurity: Not on file  ?Transportation Needs: Not on file  ?Physical Activity: Not on file  ?Stress: Not on file  ?Social Connections: Not on file  ?Intimate Partner Violence: Not on file  ? ? ?Outpatient Medications Prior to Visit  ?Medication Sig Dispense Refill  ? feeding supplement (ENSURE ENLIVE / ENSURE PLUS) LIQD Take 237 mLs by mouth 3 (three) times daily between meals. 237 mL 12  ? methocarbamol (ROBAXIN) 500 MG tablet Take 1 tablet (500 mg total) by mouth every 6 (six) hours as needed for muscle spasms. 30 tablet 3  ? metoprolol succinate (TOPROL-XL) 25 MG 24 hr tablet Take 1.5 tablets (37.5 mg total) by mouth daily. 90 tablet 5  ? mirtazapine (REMERON SOL-TAB) 15 MG disintegrating tablet Take 0.5 tablets (7.5 mg total) by mouth at bedtime. 30 tablet 1  ? mirtazapine (REMERON) 15 MG tablet TAKE 1/2 TABLET BY MOUTH AT BEDTIME 30 tablet 3  ? Multiple Vitamin (MULTIVITAMIN) tablet Take 1 tablet by mouth at bedtime.     ? senna-docusate (SENOKOT-S) 8.6-50 MG tablet Take 2 tablets by mouth at bedtime. 60 tablet 1  ? silodosin (RAPAFLO) 4 MG CAPS capsule TAKE 1 CAPSULE BY MOUTH EVERY MORNING WITH BREAKFAST 30 capsule 11  ? traMADol (ULTRAM) 50 MG tablet Take 50 mg by mouth every 6 (six) hours as needed.    ? pantoprazole (PROTONIX) 40 MG tablet Take 1 tablet (40 mg total) by mouth daily. 30 tablet 0  ? LORazepam (ATIVAN) 1 MG tablet Take 1 tablet (1 mg total) by mouth every 12 (twelve) hours as needed for anxiety or sleep. 12 tablet 0  ? phenazopyridine (PYRIDIUM) 200 MG tablet Take 1 tablet (200 mg total) by mouth 3 (three) times daily as needed for pain. 10 tablet 0  ? ?No facility-administered medications prior to visit.  ? ? ?Allergies  ?Allergen Reactions  ? Cephalexin Rash  ?  REACTION: rash  ? ? ?ROS ?Review of Systems ?As per HPI.  ?  ?Objective:  ?  ?Physical Exam ?Vitals and nursing note reviewed.  ?Constitutional:   ?   General: He is not in acute  distress. ?   Appearance: He is not ill-appearing, toxic-appearing or diaphoretic.  ?HENT:  ?   Head: Normocephalic and atraumatic.  ?   Nose: Nose normal.  ?   Mouth/Throat:  ?   Mouth: Mucous membranes are moist.  ?   Pharynx: Oropharynx is clear.  ?Eyes:  ?   General: No scleral icterus. ?   Conjunctiva/sclera: Conjunctivae normal.  ?   Pupils: Pupils are equal, round, and reactive to light.  ?Cardiovascular:  ?   Rate and Rhythm: Normal rate and regular rhythm.  ?   Heart sounds: Normal heart sounds. No murmur heard. ?Pulmonary:  ?   Effort: Pulmonary effort is normal. No respiratory distress.  ?  Breath sounds: Normal breath sounds.  ?Abdominal:  ?   General: Bowel sounds are normal. There is no distension.  ?   Palpations: Abdomen is soft.  ?   Tenderness: There is no abdominal tenderness. There is no guarding or rebound.  ?Musculoskeletal:  ?   Right lower leg: No edema.  ?   Left lower leg: No edema.  ?Skin: ?   General: Skin is warm and dry.  ?   Coloration: Skin is not jaundiced.  ?Neurological:  ?   General: No focal deficit present.  ?   Mental Status: He is alert and oriented to person, place, and time.  ?   Motor: Weakness (generalized) present.  ?Psychiatric:     ?   Mood and Affect: Mood normal.     ?   Behavior: Behavior normal.  ? ? ?BP 121/70   Pulse (!) 101   Temp 99.4 ?F (37.4 ?C) (Oral)   Ht '5\' 11"'$  (1.803 m)   Wt 152 lb 4 oz (69.1 kg)   BMI 21.23 kg/m?  ?Wt Readings from Last 3 Encounters:  ?03/30/22 152 lb 4 oz (69.1 kg)  ?02/02/22 142 lb 1.6 oz (64.5 kg)  ?12/30/21 136 lb (61.7 kg)  ? ? ? ?Health Maintenance Due  ?Topic Date Due  ? TETANUS/TDAP  Never done  ? Zoster Vaccines- Shingrix (1 of 2) Never done  ? Pneumonia Vaccine 40+ Years old (1 - PCV) Never done  ? ? ?There are no preventive care reminders to display for this patient. ? ?No results found for: TSH ?Lab Results  ?Component Value Date  ? WBC 11.8 (H) 12/30/2021  ? HGB 10.8 (L) 12/30/2021  ? HCT 32.0 (L) 12/30/2021  ? MCV 94  12/30/2021  ? PLT 432 12/30/2021  ? ?Lab Results  ?Component Value Date  ? NA 139 12/30/2021  ? K 4.5 12/30/2021  ? CO2 26 12/30/2021  ? GLUCOSE 190 (H) 12/30/2021  ? BUN 21 12/30/2021  ? CREATININE 1.29 (H) 12/30/2021  ?

## 2022-03-30 NOTE — Patient Instructions (Signed)
Fever, Adult     A fever is an increase in the body's temperature. It is usually defined as a temperature of 100.4F (38C) or higher. Brief mild or moderate fevers generally have no long-term effects, and they often do not need treatment. Moderate or high fevers may make you feel uncomfortable and can sometimes be a sign of a serious illness or disease. The sweating that may occur with repeated or prolonged fever may also cause a loss of fluid in the body (dehydration). Fever is confirmed by taking a temperature with a thermometer. A measured temperature can vary with: Age. Time of day. Where in the body you take the temperature. Readings may vary if you place the thermometer: In the mouth (oral). In the rectum (rectal). In the ear (tympanic). Under the arm (axillary). On the forehead (temporal). Follow these instructions at home: Medicines Take over-the counter and prescription medicines only as told by your health care provider. Follow the dosing instructions carefully. If you were prescribed an antibiotic medicine, take it as told by your health care provider. Do not stop taking the antibiotic even if you start to feel better. General instructions Watch your condition for any changes. Let your health care provider know about them. Rest as needed. Drink enough fluid to keep your urine pale yellow. This helps to prevent dehydration. Sponge yourself or bathe with room-temperature water to help reduce your body temperature as needed. Do not use ice water. Do not use too many blankets or wear clothes that are too heavy. If your fever may be caused by an infection that spreads from person to person (is contagious), such as a cold or the flu, you should stay home from work and public gatherings for at least 24 hours after your fever is gone. Your fever should be gone without the need to use medicines. Contact a health care provider if: You vomit. You cannot eat or drink without vomiting. You  have diarrhea. You have pain when you urinate. Your symptoms do not improve with treatment. You develop new symptoms. You develop excessive weakness. Get help right away if: You have shortness of breath or have trouble breathing. You are dizzy or you faint. You are disoriented or confused. You develop signs of dehydration, such as: Dark urine, very little urine, or no urine. Cracked lips. Dry mouth. Sunken eyes. Sleepiness. Weakness. You develop severe pain in your abdomen. You have persistent vomiting or diarrhea. You develop a skin rash. Your symptoms suddenly get worse. Summary A fever is an increase in the body's temperature. It is usually defined as a temperature of 100.4F (38C) or higher. Moderate or high fevers can sometimes be a sign of a serious illness or disease. The sweating that may occur with repeated or prolonged fever may also cause dehydration. Pay attention to any changes in your symptoms and contact your health care provider if your symptoms do not improve with treatment. Take over-the counter and prescription medicines only as told by your health care provider. Follow the dosing instructions carefully. If your fever is from an infection that may be contagious, such as cold or flu, you should stay home from work and public gatherings for at least 24 hours after your fever is gone. Your fever should be gone without the need to use medicines. Get help right away if you develop signs of dehydration, such as dark urine, cracked lips, dry mouth, sunken eyes, sleepiness, or weakness. This information is not intended to replace advice given to you by   your health care provider. Make sure you discuss any questions you have with your health care provider. Document Revised: 04/22/2021 Document Reviewed: 04/22/2021 Elsevier Patient Education  2023 Elsevier Inc.  

## 2022-03-31 ENCOUNTER — Other Ambulatory Visit: Payer: Self-pay

## 2022-03-31 ENCOUNTER — Other Ambulatory Visit: Payer: Medicare Other

## 2022-03-31 DIAGNOSIS — R509 Fever, unspecified: Secondary | ICD-10-CM

## 2022-03-31 LAB — CMP14+EGFR
ALT: 49 IU/L — ABNORMAL HIGH (ref 0–44)
AST: 79 IU/L — ABNORMAL HIGH (ref 0–40)
Albumin/Globulin Ratio: 1 — ABNORMAL LOW (ref 1.2–2.2)
Albumin: 3.3 g/dL — ABNORMAL LOW (ref 3.6–4.6)
Alkaline Phosphatase: 621 IU/L — ABNORMAL HIGH (ref 44–121)
BUN/Creatinine Ratio: 15 (ref 10–24)
BUN: 21 mg/dL (ref 8–27)
Bilirubin Total: 0.8 mg/dL (ref 0.0–1.2)
CO2: 23 mmol/L (ref 20–29)
Calcium: 8.5 mg/dL — ABNORMAL LOW (ref 8.6–10.2)
Chloride: 106 mmol/L (ref 96–106)
Creatinine, Ser: 1.37 mg/dL — ABNORMAL HIGH (ref 0.76–1.27)
Globulin, Total: 3.3 g/dL (ref 1.5–4.5)
Glucose: 160 mg/dL — ABNORMAL HIGH (ref 70–99)
Potassium: 3.6 mmol/L (ref 3.5–5.2)
Sodium: 143 mmol/L (ref 134–144)
Total Protein: 6.6 g/dL (ref 6.0–8.5)
eGFR: 51 mL/min/{1.73_m2} — ABNORMAL LOW (ref >59)

## 2022-03-31 LAB — URINALYSIS, ROUTINE W REFLEX MICROSCOPIC
Bilirubin, UA: NEGATIVE
Glucose, UA: NEGATIVE
Ketones, UA: NEGATIVE
Leukocytes,UA: NEGATIVE
Nitrite, UA: NEGATIVE
RBC, UA: NEGATIVE
Specific Gravity, UA: >1.03 — ABNORMAL HIGH (ref 1.005–1.030)
Urobilinogen, Ur: 0.2 mg/dL (ref 0.2–1.0)
pH, UA: 5 (ref 5.0–7.5)

## 2022-03-31 LAB — CBC WITH DIFFERENTIAL/PLATELET
Basophils Absolute: 0 10*3/uL (ref 0.0–0.2)
Basos: 0 %
EOS (ABSOLUTE): 0.2 10*3/uL (ref 0.0–0.4)
Eos: 2 %
Hematocrit: 29.8 % — ABNORMAL LOW (ref 37.5–51.0)
Hemoglobin: 10 g/dL — ABNORMAL LOW (ref 13.0–17.7)
Immature Grans (Abs): 0 10*3/uL (ref 0.0–0.1)
Immature Granulocytes: 0 %
Lymphocytes Absolute: 2.6 10*3/uL (ref 0.7–3.1)
Lymphs: 26 %
MCH: 32.4 pg (ref 26.6–33.0)
MCHC: 33.6 g/dL (ref 31.5–35.7)
MCV: 96 fL (ref 79–97)
Monocytes Absolute: 0.9 10*3/uL (ref 0.1–0.9)
Monocytes: 9 %
Neutrophils Absolute: 6.4 10*3/uL (ref 1.4–7.0)
Neutrophils: 63 %
Platelets: 300 10*3/uL (ref 150–450)
RBC: 3.09 x10E6/uL — ABNORMAL LOW (ref 4.14–5.80)
RDW: 13.2 % (ref 11.6–15.4)
WBC: 10.1 10*3/uL (ref 3.4–10.8)

## 2022-03-31 LAB — MICROSCOPIC EXAMINATION: Renal Epithel, UA: NONE SEEN /hpf

## 2022-04-01 ENCOUNTER — Other Ambulatory Visit: Payer: Self-pay | Admitting: Family Medicine

## 2022-04-01 DIAGNOSIS — R748 Abnormal levels of other serum enzymes: Secondary | ICD-10-CM

## 2022-04-01 DIAGNOSIS — R509 Fever, unspecified: Secondary | ICD-10-CM

## 2022-04-01 LAB — URINE CULTURE

## 2022-04-01 MED ORDER — AMOXICILLIN 875 MG PO TABS: 875.0000 mg | ORAL_TABLET | Freq: Two times a day (BID) | ORAL | 0 refills | Status: AC

## 2022-04-06 ENCOUNTER — Encounter: Payer: Self-pay | Admitting: Gastroenterology

## 2022-04-06 ENCOUNTER — Encounter: Payer: Self-pay | Admitting: Family Medicine

## 2022-04-08 ENCOUNTER — Encounter (HOSPITAL_COMMUNITY): Payer: Self-pay | Admitting: Emergency Medicine

## 2022-04-08 ENCOUNTER — Emergency Department (HOSPITAL_COMMUNITY): Payer: Medicare Other

## 2022-04-08 ENCOUNTER — Inpatient Hospital Stay (HOSPITAL_COMMUNITY)
Admission: EM | Admit: 2022-04-08 | Discharge: 2022-04-11 | DRG: 871 | Disposition: A | Discharge status: Home Health | Payer: Medicare Other | Attending: Family Medicine | Admitting: Family Medicine

## 2022-04-08 DIAGNOSIS — Z87442 Personal history of urinary calculi: Secondary | ICD-10-CM | POA: Diagnosis not present

## 2022-04-08 DIAGNOSIS — E872 Acidosis, unspecified: Secondary | ICD-10-CM | POA: Diagnosis present

## 2022-04-08 DIAGNOSIS — K7200 Acute and subacute hepatic failure without coma: Secondary | ICD-10-CM | POA: Diagnosis not present

## 2022-04-08 DIAGNOSIS — E7800 Pure hypercholesterolemia, unspecified: Secondary | ICD-10-CM | POA: Diagnosis not present

## 2022-04-08 DIAGNOSIS — R109 Unspecified abdominal pain: Secondary | ICD-10-CM | POA: Diagnosis not present

## 2022-04-08 DIAGNOSIS — E46 Unspecified protein-calorie malnutrition: Secondary | ICD-10-CM | POA: Diagnosis present

## 2022-04-08 DIAGNOSIS — Z79899 Other long term (current) drug therapy: Secondary | ICD-10-CM | POA: Diagnosis not present

## 2022-04-08 DIAGNOSIS — A419 Sepsis, unspecified organism: Secondary | ICD-10-CM | POA: Diagnosis not present

## 2022-04-08 DIAGNOSIS — C249 Malignant neoplasm of biliary tract, unspecified: Secondary | ICD-10-CM | POA: Diagnosis present

## 2022-04-08 DIAGNOSIS — R531 Weakness: Secondary | ICD-10-CM

## 2022-04-08 DIAGNOSIS — E876 Hypokalemia: Secondary | ICD-10-CM

## 2022-04-08 DIAGNOSIS — M25569 Pain in unspecified knee: Secondary | ICD-10-CM | POA: Diagnosis not present

## 2022-04-08 DIAGNOSIS — R0902 Hypoxemia: Secondary | ICD-10-CM | POA: Diagnosis not present

## 2022-04-08 DIAGNOSIS — Z881 Allergy status to other antibiotic agents status: Secondary | ICD-10-CM

## 2022-04-08 DIAGNOSIS — R7401 Elevation of levels of liver transaminase levels: Secondary | ICD-10-CM | POA: Diagnosis present

## 2022-04-08 DIAGNOSIS — Z66 Do not resuscitate: Secondary | ICD-10-CM | POA: Diagnosis not present

## 2022-04-08 DIAGNOSIS — I4891 Unspecified atrial fibrillation: Secondary | ICD-10-CM | POA: Diagnosis not present

## 2022-04-08 DIAGNOSIS — Z20822 Contact with and (suspected) exposure to covid-19: Secondary | ICD-10-CM | POA: Diagnosis not present

## 2022-04-08 DIAGNOSIS — K831 Obstruction of bile duct: Secondary | ICD-10-CM | POA: Diagnosis not present

## 2022-04-08 DIAGNOSIS — R54 Age-related physical debility: Secondary | ICD-10-CM | POA: Diagnosis present

## 2022-04-08 DIAGNOSIS — I480 Paroxysmal atrial fibrillation: Secondary | ICD-10-CM | POA: Diagnosis not present

## 2022-04-08 DIAGNOSIS — Z743 Need for continuous supervision: Secondary | ICD-10-CM | POA: Diagnosis not present

## 2022-04-08 DIAGNOSIS — K8309 Other cholangitis: Secondary | ICD-10-CM | POA: Diagnosis present

## 2022-04-08 DIAGNOSIS — R6520 Severe sepsis without septic shock: Secondary | ICD-10-CM | POA: Diagnosis not present

## 2022-04-08 DIAGNOSIS — R748 Abnormal levels of other serum enzymes: Secondary | ICD-10-CM

## 2022-04-08 DIAGNOSIS — R0689 Other abnormalities of breathing: Secondary | ICD-10-CM | POA: Diagnosis not present

## 2022-04-08 DIAGNOSIS — Z823 Family history of stroke: Secondary | ICD-10-CM

## 2022-04-08 DIAGNOSIS — R000 Tachycardia, unspecified: Secondary | ICD-10-CM | POA: Diagnosis not present

## 2022-04-08 DIAGNOSIS — I10 Essential (primary) hypertension: Secondary | ICD-10-CM | POA: Diagnosis present

## 2022-04-08 DIAGNOSIS — Z87440 Personal history of urinary (tract) infections: Secondary | ICD-10-CM

## 2022-04-08 DIAGNOSIS — R509 Fever, unspecified: Secondary | ICD-10-CM

## 2022-04-08 DIAGNOSIS — K219 Gastro-esophageal reflux disease without esophagitis: Secondary | ICD-10-CM | POA: Diagnosis not present

## 2022-04-08 DIAGNOSIS — E8809 Other disorders of plasma-protein metabolism, not elsewhere classified: Secondary | ICD-10-CM | POA: Diagnosis not present

## 2022-04-08 DIAGNOSIS — J90 Pleural effusion, not elsewhere classified: Secondary | ICD-10-CM | POA: Diagnosis not present

## 2022-04-08 LAB — CBC WITH DIFFERENTIAL/PLATELET
Abs Immature Granulocytes: 0.11 10*3/uL — ABNORMAL HIGH (ref 0.00–0.07)
Basophils Absolute: 0 10*3/uL (ref 0.0–0.1)
Basophils Relative: 0 %
Eosinophils Absolute: 0 10*3/uL (ref 0.0–0.5)
Eosinophils Relative: 0 %
HCT: 30.2 % — ABNORMAL LOW (ref 39.0–52.0)
Hemoglobin: 9.6 g/dL — ABNORMAL LOW (ref 13.0–17.0)
Immature Granulocytes: 1 %
Lymphocytes Relative: 4 %
Lymphs Abs: 0.7 10*3/uL (ref 0.7–4.0)
MCH: 32.1 pg (ref 26.0–34.0)
MCHC: 31.8 g/dL (ref 30.0–36.0)
MCV: 101 fL — ABNORMAL HIGH (ref 80.0–100.0)
Monocytes Absolute: 0.7 10*3/uL (ref 0.1–1.0)
Monocytes Relative: 4 %
Neutro Abs: 15 10*3/uL — ABNORMAL HIGH (ref 1.7–7.7)
Neutrophils Relative %: 91 %
Platelets: 342 10*3/uL (ref 150–400)
RBC: 2.99 MIL/uL — ABNORMAL LOW (ref 4.22–5.81)
RDW: 14.6 % (ref 11.5–15.5)
WBC: 16.6 10*3/uL — ABNORMAL HIGH (ref 4.0–10.5)
nRBC: 0 % (ref 0.0–0.2)

## 2022-04-08 LAB — COMPREHENSIVE METABOLIC PANEL
ALT: 67 U/L — ABNORMAL HIGH (ref 0–44)
AST: 195 U/L — ABNORMAL HIGH (ref 15–41)
Albumin: 2.8 g/dL — ABNORMAL LOW (ref 3.5–5.0)
Alkaline Phosphatase: 694 U/L — ABNORMAL HIGH (ref 38–126)
Anion gap: 11 (ref 5–15)
BUN: 17 mg/dL (ref 8–23)
CO2: 23 mmol/L (ref 22–32)
Calcium: 8.2 mg/dL — ABNORMAL LOW (ref 8.9–10.3)
Chloride: 106 mmol/L (ref 98–111)
Creatinine, Ser: 1.33 mg/dL — ABNORMAL HIGH (ref 0.61–1.24)
GFR, Estimated: 52 mL/min — ABNORMAL LOW (ref >60)
Glucose, Bld: 181 mg/dL — ABNORMAL HIGH (ref 70–99)
Potassium: 2.9 mmol/L — ABNORMAL LOW (ref 3.5–5.1)
Sodium: 140 mmol/L (ref 135–145)
Total Bilirubin: 2.1 mg/dL — ABNORMAL HIGH (ref 0.3–1.2)
Total Protein: 7 g/dL (ref 6.5–8.1)

## 2022-04-08 LAB — BRAIN NATRIURETIC PEPTIDE: B Natriuretic Peptide: 242 pg/mL — ABNORMAL HIGH (ref 0.0–100.0)

## 2022-04-08 LAB — LACTIC ACID, PLASMA: Lactic Acid, Venous: 3.9 mmol/L (ref 0.5–1.9)

## 2022-04-08 MED ORDER — SODIUM CHLORIDE 0.9 % IV BOLUS
1000.0000 mL | Freq: Once | INTRAVENOUS | Status: AC
  Administered 2022-04-08: 1000 mL via INTRAVENOUS

## 2022-04-08 NOTE — ED Provider Notes (Signed)
?[facility] ?Provider Note ? ? ?CSN: [id number] ?Arrival date & time: 04/08/22  2204 ? ?  ? ?History ? ?Chief Complaint  ?Patient presents with  ? Fever  ? ? ?Terry Hernandez is a 88 y.o. male. ? ?HPI ?Patient presents with him for concern of fever.  Seemingly the patient was diagnosed with urinary tract infection last week and started on antibiotics.  After culture report was negative and antibiotics were stopped.  However, the patient reportedly had a fever earlier today.  This improved with Tylenol.  EMS reports no fever in route, but he met sepsis criteria apparently, received ceftriaxone IM, 500 mL of fluid.  Patient denies pain, acknowledges weakness.  He may have a malignancy, but he is unaware of this. ?  ? ?Home Medications ?Prior to Admission medications   ?Medication Sig Start Date End Date Taking? Authorizing Provider  ?amoxicillin (AMOXIL) 875 MG tablet Take 1 tablet (875 mg total) by mouth 2 (two) times daily for 7 days. 04/01/22 04/08/22  [name], FNP  ?feeding supplement (ENSURE ENLIVE / ENSURE PLUS) LIQD Take 237 mLs by mouth 3 (three) times daily between meals. 11/17/21   Roxan Hockey, MD  ?methocarbamol (ROBAXIN) 500 MG tablet Take 1 tablet (500 mg total) by mouth every 6 (six) hours as needed for muscle spasms. 03/19/20   Kristeen Miss, MD  ?metoprolol succinate (TOPROL-XL) 25 MG 24 hr tablet Take 1.5 tablets (37.5 mg total) by mouth daily. 01/14/22   Buford Dresser, MD  ?mirtazapine (REMERON SOL-TAB) 15 MG disintegrating tablet Take 0.5 tablets (7.5 mg total) by mouth at bedtime. 11/17/21   Roxan Hockey, MD  ?mirtazapine (REMERON) 15 MG tablet TAKE 1/2 TABLET BY MOUTH AT BEDTIME 03/18/22   [name], FNP  ?Multiple Vitamin (MULTIVITAMIN) tablet Take 1 tablet by mouth at bedtime.     [provider]  ?pantoprazole (PROTONIX) 40 MG tablet Take 1 tablet (40 mg total) by mouth daily. 10/29/21 11/28/21  Aline August, MD  ?senna-docusate (SENOKOT-S)  8.6-50 MG tablet Take 2 tablets by mouth at bedtime. 11/17/21 11/17/22  Roxan Hockey, MD  ?silodosin (RAPAFLO) 4 MG CAPS capsule TAKE 1 CAPSULE BY MOUTH EVERY MORNING WITH BREAKFAST 02/12/22   Irine Seal, MD  ?traMADol (ULTRAM) 50 MG tablet Take 50 mg by mouth every 6 (six) hours as needed. 01/14/22   [provider]  ?   ? ?Allergies    ?Cephalexin   ? ?Review of Systems   ?Review of Systems  ?All other systems reviewed and are negative. ? ?Physical Exam ?Updated Vital Signs ?BP 132/70 (BP Location: Left Arm)   Pulse 93   Temp 98.6 ?F (37 ?C) (Oral)   Resp 20   Ht _0  (1.803 m)   Wt 69.1 kg   SpO2 95%   BMI 21.23 kg/m?  ?Physical Exam ?Vitals and nursing note reviewed.  ?Constitutional:   ?   General: He is not in acute distress. ?   Appearance: He is well-developed.  ?HENT:  ?   Head: Normocephalic and atraumatic.  ?Eyes:  ?   Conjunctiva/sclera: Conjunctivae normal.  ?Cardiovascular:  ?   Rate and Rhythm: Normal rate and regular rhythm.  ?Pulmonary:  ?   Effort: Pulmonary effort is normal. No respiratory distress.  ?   Breath sounds: No stridor.  ?Abdominal:  ?   General: There is no distension.  ?Musculoskeletal:  ?   Right lower leg: Edema present.  ?   Left lower leg: Edema  present.  ?Skin: ?   General: Skin is warm and dry.  ?   Coloration: Skin is pale.  ?Neurological:  ?   Mental Status: He is alert and oriented to person, place, and time.  ? ? ?ED Results / Procedures / Treatments   ?Labs ?(all labs ordered are listed, but only abnormal results are displayed) ?Labs Reviewed  ?RESP PANEL BY RT-PCR (FLU A&B, COVID) [id number]  ?COMPREHENSIVE METABOLIC PANEL  ?LACTIC ACID, PLASMA  ?LACTIC ACID, PLASMA  ?CBC WITH DIFFERENTIAL/PLATELET  ?URINALYSIS, ROUTINE W REFLEX MICROSCOPIC  ?BRAIN NATRIURETIC PEPTIDE  ? ? ?EKG ?None ? ?Radiology ?No results found. ? ?Procedures ?Procedures  ? ? ?Medications Ordered in ED ?Medications  ?sodium chloride 0.9 % bolus 1,000 mL (has no administration in time range)   ? ? ?ED Course/ Medical Decision Making/ A&P ?This patient with a Hx of multiple medical issues, including GERD, hypertension, presents to the ED for concern of fever, this involves an extensive number of treatment options, and is a complaint that carries with it a high risk of complications and morbidity.   ? ?The differential diagnosis includes pneumonia, bacteremia, sepsis, malignant process ? ? ?Social Determinants of Health: ? ?Age ? ?Additional history obtained: ? ?Additional history and/or information obtained from chart review, notable for primary care visit from 1 week ago notable for fever at that point 104, with ongoing efforts to control it. ? ? ?After the initial evaluation, orders, including: Labs x-ray urine were initiated. ? ? ?Patient placed on Cardiac and Pulse-Oximetry Monitors. ?The patient was maintained on a cardiac monitor.  The cardiac monitored showed an rhythm of 95 sinus normal ?The patient was also maintained on pulse oximetry. The readings were typically 99% with 2 L nasal cannula abnormal.  Notably the patient was previously on nasal cannula at home, but this was stopped at some point, but here patient was hypoxic 90% on room air. ? ? ?On repeat evaluation of the patient improved ? ?Lab Tests: ? ?I personally interpreted labs.  The pertinent results include: Leukocytosis ? ? ?Signed out to Dr. Stark Jock.  Discussed the patient's history of fever, concern for bacteremia, sepsis, he is awake, alert, generally reassuring physical exam.  On chart review, neck states care of the patient was admitted for ongoing monitoring, management. ? ? ? ?Final Clinical Impression(s) / ED Diagnoses ?Final diagnoses:  ?Cholangitis  ?Fever in adult  ? ?  ?Carmin Muskrat, MD ?04/09/22 1836 ? ?

## 2022-04-08 NOTE — ED Triage Notes (Signed)
Pt arrives [facility] from home c/o fever. Per family pt had a fever last week and dx with UTI, pt advised a few days later to stop taking the abx. Pt began to have another fever today so family called EMS.  ? ?Pt given 1g Rocephin IM and 510m NS by EMS. ?

## 2022-04-09 ENCOUNTER — Inpatient Hospital Stay (HOSPITAL_COMMUNITY): Payer: Medicare Other

## 2022-04-09 ENCOUNTER — Other Ambulatory Visit: Payer: Self-pay

## 2022-04-09 ENCOUNTER — Other Ambulatory Visit (HOSPITAL_COMMUNITY): Payer: Self-pay | Admitting: Radiology

## 2022-04-09 ENCOUNTER — Other Ambulatory Visit (HOSPITAL_COMMUNITY): Payer: Self-pay

## 2022-04-09 ENCOUNTER — Emergency Department (HOSPITAL_COMMUNITY): Payer: Medicare Other

## 2022-04-09 DIAGNOSIS — R748 Abnormal levels of other serum enzymes: Secondary | ICD-10-CM

## 2022-04-09 DIAGNOSIS — Z79899 Other long term (current) drug therapy: Secondary | ICD-10-CM | POA: Diagnosis not present

## 2022-04-09 DIAGNOSIS — I4891 Unspecified atrial fibrillation: Secondary | ICD-10-CM | POA: Diagnosis not present

## 2022-04-09 DIAGNOSIS — M25569 Pain in unspecified knee: Secondary | ICD-10-CM | POA: Diagnosis present

## 2022-04-09 DIAGNOSIS — I480 Paroxysmal atrial fibrillation: Secondary | ICD-10-CM | POA: Diagnosis not present

## 2022-04-09 DIAGNOSIS — C249 Malignant neoplasm of biliary tract, unspecified: Secondary | ICD-10-CM | POA: Diagnosis not present

## 2022-04-09 DIAGNOSIS — E7800 Pure hypercholesterolemia, unspecified: Secondary | ICD-10-CM | POA: Diagnosis present

## 2022-04-09 DIAGNOSIS — R54 Age-related physical debility: Secondary | ICD-10-CM | POA: Diagnosis present

## 2022-04-09 DIAGNOSIS — K8309 Other cholangitis: Secondary | ICD-10-CM | POA: Diagnosis not present

## 2022-04-09 DIAGNOSIS — J9811 Atelectasis: Secondary | ICD-10-CM | POA: Diagnosis not present

## 2022-04-09 DIAGNOSIS — A419 Sepsis, unspecified organism: Secondary | ICD-10-CM

## 2022-04-09 DIAGNOSIS — Z87442 Personal history of urinary calculi: Secondary | ICD-10-CM | POA: Diagnosis not present

## 2022-04-09 DIAGNOSIS — E46 Unspecified protein-calorie malnutrition: Secondary | ICD-10-CM | POA: Diagnosis not present

## 2022-04-09 DIAGNOSIS — C229 Malignant neoplasm of liver, not specified as primary or secondary: Secondary | ICD-10-CM | POA: Diagnosis not present

## 2022-04-09 DIAGNOSIS — N281 Cyst of kidney, acquired: Secondary | ICD-10-CM | POA: Diagnosis not present

## 2022-04-09 DIAGNOSIS — K5730 Diverticulosis of large intestine without perforation or abscess without bleeding: Secondary | ICD-10-CM | POA: Diagnosis not present

## 2022-04-09 DIAGNOSIS — E872 Acidosis, unspecified: Secondary | ICD-10-CM

## 2022-04-09 DIAGNOSIS — Z823 Family history of stroke: Secondary | ICD-10-CM | POA: Diagnosis not present

## 2022-04-09 DIAGNOSIS — I10 Essential (primary) hypertension: Secondary | ICD-10-CM | POA: Diagnosis not present

## 2022-04-09 DIAGNOSIS — K7200 Acute and subacute hepatic failure without coma: Secondary | ICD-10-CM

## 2022-04-09 DIAGNOSIS — E8809 Other disorders of plasma-protein metabolism, not elsewhere classified: Secondary | ICD-10-CM | POA: Diagnosis not present

## 2022-04-09 DIAGNOSIS — Z66 Do not resuscitate: Secondary | ICD-10-CM | POA: Diagnosis not present

## 2022-04-09 DIAGNOSIS — K831 Obstruction of bile duct: Secondary | ICD-10-CM | POA: Diagnosis not present

## 2022-04-09 DIAGNOSIS — J90 Pleural effusion, not elsewhere classified: Secondary | ICD-10-CM | POA: Diagnosis not present

## 2022-04-09 DIAGNOSIS — K219 Gastro-esophageal reflux disease without esophagitis: Secondary | ICD-10-CM | POA: Diagnosis not present

## 2022-04-09 DIAGNOSIS — E876 Hypokalemia: Secondary | ICD-10-CM

## 2022-04-09 DIAGNOSIS — R6520 Severe sepsis without septic shock: Secondary | ICD-10-CM

## 2022-04-09 DIAGNOSIS — R531 Weakness: Secondary | ICD-10-CM | POA: Diagnosis not present

## 2022-04-09 DIAGNOSIS — Z20822 Contact with and (suspected) exposure to covid-19: Secondary | ICD-10-CM | POA: Diagnosis not present

## 2022-04-09 DIAGNOSIS — K3189 Other diseases of stomach and duodenum: Secondary | ICD-10-CM | POA: Diagnosis not present

## 2022-04-09 DIAGNOSIS — Z87440 Personal history of urinary (tract) infections: Secondary | ICD-10-CM | POA: Diagnosis not present

## 2022-04-09 DIAGNOSIS — R109 Unspecified abdominal pain: Secondary | ICD-10-CM | POA: Diagnosis not present

## 2022-04-09 DIAGNOSIS — Z881 Allergy status to other antibiotic agents status: Secondary | ICD-10-CM | POA: Diagnosis not present

## 2022-04-09 LAB — CBC WITH DIFFERENTIAL/PLATELET
Abs Immature Granulocytes: 0.12 10*3/uL — ABNORMAL HIGH (ref 0.00–0.07)
Basophils Absolute: 0.1 10*3/uL (ref 0.0–0.1)
Basophils Relative: 0 %
Eosinophils Absolute: 0.1 10*3/uL (ref 0.0–0.5)
Eosinophils Relative: 0 %
HCT: 28.9 % — ABNORMAL LOW (ref 39.0–52.0)
Hemoglobin: 9.2 g/dL — ABNORMAL LOW (ref 13.0–17.0)
Immature Granulocytes: 1 %
Lymphocytes Relative: 6 %
Lymphs Abs: 1.3 10*3/uL (ref 0.7–4.0)
MCH: 33 pg (ref 26.0–34.0)
MCHC: 31.8 g/dL (ref 30.0–36.0)
MCV: 103.6 fL — ABNORMAL HIGH (ref 80.0–100.0)
Monocytes Absolute: 0.5 10*3/uL (ref 0.1–1.0)
Monocytes Relative: 3 %
Neutro Abs: 18.3 10*3/uL — ABNORMAL HIGH (ref 1.7–7.7)
Neutrophils Relative %: 90 %
Platelets: 307 10*3/uL (ref 150–400)
RBC: 2.79 MIL/uL — ABNORMAL LOW (ref 4.22–5.81)
RDW: 15 % (ref 11.5–15.5)
WBC: 20.3 10*3/uL — ABNORMAL HIGH (ref 4.0–10.5)
nRBC: 0 % (ref 0.0–0.2)

## 2022-04-09 LAB — URINALYSIS, ROUTINE W REFLEX MICROSCOPIC
Bilirubin Urine: NEGATIVE
Glucose, UA: NEGATIVE mg/dL
Hgb urine dipstick: NEGATIVE
Ketones, ur: NEGATIVE mg/dL
Leukocytes,Ua: NEGATIVE
Nitrite: NEGATIVE
Protein, ur: 100 mg/dL — AB
Specific Gravity, Urine: 1.028 (ref 1.005–1.030)
pH: 5 (ref 5.0–8.0)

## 2022-04-09 LAB — LACTIC ACID, PLASMA
Lactic Acid, Venous: 3 mmol/L (ref 0.5–1.9)
Lactic Acid, Venous: 3.6 mmol/L (ref 0.5–1.9)

## 2022-04-09 LAB — COMPREHENSIVE METABOLIC PANEL
ALT: 60 U/L — ABNORMAL HIGH (ref 0–44)
AST: 150 U/L — ABNORMAL HIGH (ref 15–41)
Albumin: 2.7 g/dL — ABNORMAL LOW (ref 3.5–5.0)
Alkaline Phosphatase: 605 U/L — ABNORMAL HIGH (ref 38–126)
Anion gap: 9 (ref 5–15)
BUN: 17 mg/dL (ref 8–23)
CO2: 23 mmol/L (ref 22–32)
Calcium: 8.1 mg/dL — ABNORMAL LOW (ref 8.9–10.3)
Chloride: 109 mmol/L (ref 98–111)
Creatinine, Ser: 1.27 mg/dL — ABNORMAL HIGH (ref 0.61–1.24)
GFR, Estimated: 55 mL/min — ABNORMAL LOW (ref >60)
Glucose, Bld: 161 mg/dL — ABNORMAL HIGH (ref 70–99)
Potassium: 3.8 mmol/L (ref 3.5–5.1)
Sodium: 141 mmol/L (ref 135–145)
Total Bilirubin: 2.2 mg/dL — ABNORMAL HIGH (ref 0.3–1.2)
Total Protein: 6.6 g/dL (ref 6.5–8.1)

## 2022-04-09 LAB — PROCALCITONIN: Procalcitonin: 7.03 ng/mL

## 2022-04-09 LAB — MAGNESIUM: Magnesium: 1.7 mg/dL (ref 1.7–2.4)

## 2022-04-09 LAB — RESP PANEL BY RT-PCR (FLU A&B, COVID) ARPGX2
Influenza A by PCR: NEGATIVE
Influenza B by PCR: NEGATIVE
SARS Coronavirus 2 by RT PCR: NEGATIVE

## 2022-04-09 MED ORDER — IOHEXOL 300 MG/ML  SOLN
100.0000 mL | Freq: Once | INTRAMUSCULAR | Status: AC | PRN
  Administered 2022-04-09: 100 mL via INTRAVENOUS

## 2022-04-09 MED ORDER — TRAMADOL HCL 50 MG PO TABS: 50.0000 mg | ORAL_TABLET | Freq: Four times a day (QID) | ORAL | Status: DC | PRN

## 2022-04-09 MED ORDER — ACETAMINOPHEN 325 MG PO TABS: 650.0000 mg | ORAL_TABLET | Freq: Four times a day (QID) | ORAL | Status: DC | PRN

## 2022-04-09 MED ORDER — ONDANSETRON HCL 4 MG/2ML IJ SOLN: 4.0000 mg | Freq: Four times a day (QID) | INTRAMUSCULAR | Status: DC | PRN

## 2022-04-09 MED ORDER — PIPERACILLIN-TAZOBACTAM 3.375 G IVPB
3.3750 g | Freq: Once | INTRAVENOUS | Status: AC
  Administered 2022-04-09: 3.375 g via INTRAVENOUS
  Filled 2022-04-09: qty 50

## 2022-04-09 MED ORDER — POTASSIUM CHLORIDE 10 MEQ/100ML IV SOLN
10.0000 meq | INTRAVENOUS | Status: AC
  Administered 2022-04-09 (×4): 10 meq via INTRAVENOUS
  Filled 2022-04-09 (×4): qty 100

## 2022-04-09 MED ORDER — METOPROLOL SUCCINATE ER 25 MG PO TB24
37.5000 mg | ORAL_TABLET | Freq: Every day | ORAL | Status: DC
Start: 2022-04-09 — End: 2022-04-11
  Administered 2022-04-09 – 2022-04-11 (×3): 37.5 mg via ORAL
  Filled 2022-04-09 (×3): qty 2

## 2022-04-09 MED ORDER — HEPARIN SODIUM (PORCINE) 5000 UNIT/ML IJ SOLN
5000.0000 [IU] | Freq: Three times a day (TID) | INTRAMUSCULAR | Status: DC
  Administered 2022-04-09 – 2022-04-11 (×7): 5000 [IU] via SUBCUTANEOUS
  Filled 2022-04-09 (×7): qty 1

## 2022-04-09 MED ORDER — MORPHINE SULFATE (PF) 2 MG/ML IV SOLN: 2.0000 mg | INTRAVENOUS | Status: DC | PRN

## 2022-04-09 MED ORDER — SODIUM CHLORIDE 0.9 % IV BOLUS
500.0000 mL | Freq: Once | INTRAVENOUS | Status: AC
  Administered 2022-04-09: 500 mL via INTRAVENOUS

## 2022-04-09 MED ORDER — ACETAMINOPHEN 650 MG RE SUPP
650.0000 mg | Freq: Four times a day (QID) | RECTAL | Status: DC | PRN
Start: 2022-04-09 — End: 2022-04-11

## 2022-04-09 MED ORDER — ONDANSETRON HCL 4 MG PO TABS: 4.0000 mg | ORAL_TABLET | Freq: Four times a day (QID) | ORAL | Status: DC | PRN

## 2022-04-09 MED ORDER — GADOBUTROL 1 MMOL/ML IV SOLN
7.0000 mL | Freq: Once | INTRAVENOUS | Status: AC | PRN
  Administered 2022-04-09: 7 mL via INTRAVENOUS

## 2022-04-09 MED ORDER — OXYCODONE HCL 5 MG PO TABS: 5.0000 mg | ORAL_TABLET | ORAL | Status: DC | PRN

## 2022-04-09 MED ORDER — PANTOPRAZOLE SODIUM 40 MG IV SOLR
40.0000 mg | INTRAVENOUS | Status: DC
  Administered 2022-04-09 – 2022-04-11 (×3): 40 mg via INTRAVENOUS
  Filled 2022-04-09 (×3): qty 10

## 2022-04-09 MED ORDER — SODIUM CHLORIDE 0.9 % IV SOLN: INTRAVENOUS | Status: DC

## 2022-04-09 MED ORDER — MIRTAZAPINE 15 MG PO TABS
7.5000 mg | ORAL_TABLET | Freq: Every day | ORAL | Status: DC
  Administered 2022-04-09 – 2022-04-10 (×2): 7.5 mg via ORAL
  Filled 2022-04-09 (×2): qty 1

## 2022-04-09 MED ORDER — PIPERACILLIN-TAZOBACTAM 3.375 G IVPB
3.3750 g | Freq: Three times a day (TID) | INTRAVENOUS | Status: DC
  Administered 2022-04-09 – 2022-04-11 (×7): 3.375 g via INTRAVENOUS
  Filled 2022-04-09 (×7): qty 50

## 2022-04-09 NOTE — Assessment & Plan Note (Signed)
-   Albumin 2.8 ?-Secondary to poor p.o. intake ?-Restart feeding supplement when patient is able to take p.o. ?

## 2022-04-09 NOTE — Evaluation (Signed)
Physical Therapy Evaluation Patient Details Name: Terry Hernandez MRN: [id number] DOB: 06-25-1934 Today's Date: 04/09/2022  History of Present Illness  Terry Hernandez is a 88 y.o. male with medical history significant of with history of GERD, hyperlipidemia, hypertension, biliary adenocarcinoma, and more presents ED with a chief complaint of fever.  Patient is mildly confused at this time.  He initially says he is here because his back was hurting, and then reports that his back does not bother him much.  When asked if he had a fever he said he thinks he has had a long for a couple years.  Son is at bedside and fills in some of the gaps.  He reports that the patient has had an intermittent fever over the last 2 weeks.  Sunday before last the fever was as high as 104.  They saw the PCP that Monday and liver enzymes are slightly elevated.  There was some concern that maybe he had a UTI, but the UA came back with an elevated specific gravity, 3+ protein, few bacteria, casts present, and 0-5 white blood cells.  Patient was taken Tylenol for intermittent fever and it helped.  Today patient was fatigued and cold throughout the day and would not do much at home which is abnormal for him.  This is the reason the son brought him into the ED.  Son reports that back in November and December patient was quite deconditioned, generalized weakness, and requiring home O2.  He got better in January and February started eating more, gained weight, and became his normal active self.  Patient ambulates with a walker with crutches depending on if he is in the house or going out somewhere.  He is normally up and about.  Went son could not get patient to to get up today and he just wanted to lay down so new something was wrong.  Patient denies any abdominal pain, nausea, vomiting, diarrhea.  He does admit to some knee pain which has been going on for quite some time and Tylenol does help.  This is related to his arthritis.  Patient has  a history of A-fib with RVR, but has not had any palpitations recently.  Patient does have a history of biliary adenocarcinoma that was diagnosed at the end of 2022.  Patient reports that he saw his first wife go through chemotherapy and he has no interest in ever doing chemo.  Son reports that they told him he was too weak to do chemo and if he wanted to at that time.  Stated palliative biliary stent placed.   Clinical Impression  Patient functioning near baseline for functional mobility and gait demonstrating good return for bed mobility, transfers and ambulation in hallway without loss of balance.  Patient requires increased time for completing sit to stands due to generalized weakness and tolerated staying up in wheelchair to be taken to a procedure by nursing staff after therapy.  Patient will benefit from continued skilled physical therapy in hospital and recommended venue below to increase strength, balance, endurance for safe ADLs and gait.         Recommendations for follow up therapy are one component of a multi-disciplinary discharge planning process, led by the attending physician.  Recommendations may be updated based on patient status, additional functional criteria and insurance authorization.  Follow Up Recommendations Home health PT    Assistance Recommended at Discharge Set up Supervision/Assistance  Patient can return home with the following  A little help with  walking and/or transfers;A little help with bathing/dressing/bathroom;Help with stairs or ramp for entrance;Assistance with cooking/housework    Equipment Recommendations None recommended by PT  Recommendations for Other Services       Functional Status Assessment Patient has had a recent decline in their functional status and demonstrates the ability to make significant improvements in function in a reasonable and predictable amount of time.     Precautions / Restrictions Precautions Precautions:  Fall Restrictions Weight Bearing Restrictions: No      Mobility  Bed Mobility Overal bed mobility: Modified Independent                  Transfers Overall transfer level: Needs assistance Equipment used: Rolling walker (2 wheels) Transfers: Sit to/from Stand, Bed to chair/wheelchair/BSC Sit to Stand: Min guard   Step pivot transfers: Min guard       General transfer comment: increased time, labored movement    Ambulation/Gait Ambulation/Gait assistance: Min guard Gait Distance (Feet): 45 Feet Assistive device: Rolling walker (2 wheels) Gait Pattern/deviations: Decreased step length - right, Decreased step length - left, Decreased stride length, Trunk flexed Gait velocity: decreased     General Gait Details: slow slightly labored cadence with flexed trunk, no loss of balance, limited mostly due to fatigue  Stairs            Wheelchair Mobility    Modified Rankin (Stroke Patients Only)       Balance Overall balance assessment: Needs assistance Sitting-balance support: Feet supported, No upper extremity supported Sitting balance-Leahy Scale: Good Sitting balance - Comments: seated at EOB   Standing balance support: During functional activity, Bilateral upper extremity supported Standing balance-Leahy Scale: Fair Standing balance comment: using RW                             Pertinent Vitals/Pain Pain Assessment Pain Assessment: No/denies pain    Home Living Family/patient expects to be discharged to:: Private residence Living Arrangements: Spouse/significant other Available Help at Discharge: Family;Available 24 hours/day Type of Home: House Home Access: Stairs to enter Entrance Stairs-Rails: Right Entrance Stairs-Number of Steps: 2   Home Layout: One level Home Equipment: Agricultural consultant (2 wheels);Rollator (4 wheels);BSC/3in1;Crutches;Shower seat      Prior Function Prior Level of Function : Needs assist       Physical  Assist : Mobility (physical);ADLs (physical) Mobility (physical): Bed mobility;Transfers;Gait;Stairs   Mobility Comments: uses crutches/RW at baseline for household and short distanced community ADLs Comments: assist in IADLs, driving, groceries, etc.; Assisted for transfer to shower but pt able to bath without assist and assisted with dressing at baseline.     Hand Dominance   Dominant Hand: Right    Extremity/Trunk Assessment   Upper Extremity Assessment Upper Extremity Assessment: Generalized weakness    Lower Extremity Assessment Lower Extremity Assessment: Generalized weakness    Cervical / Trunk Assessment Cervical / Trunk Assessment: Kyphotic  Communication   Communication: No difficulties  Cognition Arousal/Alertness: Awake/alert Behavior During Therapy: WFL for tasks assessed/performed Overall Cognitive Status: Within Functional Limits for tasks assessed                                          General Comments      Exercises     Assessment/Plan    PT Assessment Patient needs continued PT services  PT Problem  List Decreased strength;Decreased activity tolerance;Decreased balance;Decreased mobility       PT Treatment Interventions DME instruction;Gait training;Stair training;Functional mobility training;Therapeutic activities;Therapeutic exercise;Balance training;Patient/family education    PT Goals (Current goals can be found in the Care Plan section)  Acute Rehab PT Goals Patient Stated Goal: return home with family to assist PT Goal Formulation: With patient Time For Goal Achievement: 04/14/22 Potential to Achieve Goals: Good    Frequency Min 3X/week     Co-evaluation               AM-PAC PT "6 Clicks" Mobility  Outcome Measure Help needed turning from your back to your side while in a flat bed without using bedrails?: None Help needed moving from lying on your back to sitting on the side of a flat bed without using  bedrails?: None Help needed moving to and from a bed to a chair (including a wheelchair)?: A Little Help needed standing up from a chair using your arms (e.g., wheelchair or bedside chair)?: A Little Help needed to walk in hospital room?: A Little Help needed climbing 3-5 steps with a railing? : A Lot 6 Click Score: 19    End of Session   Activity Tolerance: Patient tolerated treatment well;Patient limited by fatigue Patient left: in chair;with call bell/phone within reach;Other (comment) (left in wheelchair to be taken to procedure by nursing staff) Nurse Communication: Mobility status PT Visit Diagnosis: Unsteadiness on feet (R26.81);Other abnormalities of gait and mobility (R26.89);Muscle weakness (generalized) (M62.81)    Time: 6269-4854 PT Time Calculation (min) (ACUTE ONLY): 20 min   Charges:   PT Evaluation $PT Eval Moderate Complexity: 1 Mod PT Treatments $Therapeutic Activity: 8-22 mins        3:00 PM, 04/09/22 Ocie Bob, MPT Physical Therapist with [facility] [phone] office [phone] mobile phone

## 2022-04-09 NOTE — Assessment & Plan Note (Signed)
-  Alk phos has increased from 162 in January, 621 April 17 to 694 on 26 April, now improving to 605 this morning ?-Secondary to liver pathology and cholangitis described in the other assessments ?-See plan for cholangitis and transaminitis ?-Continue to monitor ?

## 2022-04-09 NOTE — Assessment & Plan Note (Signed)
-   History of atrial fibrillation ?-Rhythm is irregularly irregular on exam ?-Last EKGs from April 2016 showed sinus rhythm ?-Repeat EKG today ?-Continue metoprolol ?-Patient is not anticoagulated at baseline ?-Continue to monitor ?

## 2022-04-09 NOTE — Assessment & Plan Note (Signed)
-   Initial lactic acid 3.9, improved to 3.6 after 1500 mL bolus ?-Continue IV hydration ?-Trend lactic acid this a.m. ?

## 2022-04-09 NOTE — Assessment & Plan Note (Signed)
-  Heart rate 93, respiratory rate 23, leukocytosis -16.6, lactic acidosis 3.9 cholangitis on CT ?-UA is not indicative of UTI ?-1500 mL bolus in ED ?-Zosyn started in ED, continue ?-Unfortunately blood cultures were not taken before antibiotics ?-Procalcitonin 7.03 ?-Continue to trend lactic acid, continue IV hydration, MRI for further characterization of liver changes ? ? ?

## 2022-04-09 NOTE — Assessment & Plan Note (Signed)
-  Total bili 2.2 ?-Slight jaundice ?-Secondary to cholangitis/biliary pathology ?-MRI to further evaluate biliary pathology and changes in liver ?-Consult GI ?-Fluids ?-Continue to monitor ?

## 2022-04-09 NOTE — Assessment & Plan Note (Signed)
-  AST 195, ALT 67 ?-This is up from April 17 with an AST of 79 and ALT of 49 ?-In the setting of elevated total bili which is increased from 0.8 on April 17, to 2.2 today ?-CT abdomen pelvis shows extrahepatic biliary stenting with improvement of intra and extrahepatic ductal dilation.  Minimal diameter of the extrahepatic bile duct is approximately 4 mm.  Interval development of nodular enhancement throughout the liver is nonspecific, Most likely cholangitis.  Contrast-enhanced MRI evaluation is recommended.  Thickening and hyperemia of the gastric antrum which is possibly infectious or inflammatory is also noted ?-Patient was started on Zosyn ?-Continue Zosyn ?-Continue to monitor transaminases ?-MRI ordered for this a.m. ? ?

## 2022-04-09 NOTE — Plan of Care (Signed)
?  Problem: Acute Rehab PT Goals(only PT should resolve) ?Goal: Pt Will Go Supine/Side To Sit ?Outcome: Progressing ?Flowsheets (Taken 04/09/2022 1502) ?Pt will go Supine/Side to Sit: with modified independence ?Goal: Patient Will Transfer Sit To/From Stand ?Outcome: Progressing ?Flowsheets (Taken 04/09/2022 1502) ?Patient will transfer sit to/from stand: ? with modified independence ? with supervision ?Goal: Pt Will Transfer Bed To Chair/Chair To Bed ?Outcome: Progressing ?Flowsheets (Taken 04/09/2022 1502) ?Pt will Transfer Bed to Chair/Chair to Bed: with supervision ?Goal: Pt Will Ambulate ?Outcome: Progressing ?Flowsheets (Taken 04/09/2022 1502) ?Pt will Ambulate: ? 75 feet ? with supervision ? with rolling walker ?  ?3:04 PM, 04/09/22 ?Lonell Grandchild, MPT ?Physical Therapist with [facility] ?[facility] ?[phone] office ?[phone] mobile phone ? ?

## 2022-04-09 NOTE — Consult Note (Addendum)
? ? ?Gastroenterology Consult  ? ?Referring Provider: No ref. provider found ?Primary Care Physician:  [name], FNP ?Primary Gastroenterologist:  Dr. Watt Climes (while inpatient last year) but son wants patient established with Dr. Gala Romney ? ?Patient ID: Terry Hernandez; [id number]; March 19, 1934  ? ?Admit date: 04/08/2022 ? LOS: 0 days  ? ?Date of Consultation: 04/09/2022 ? ?Reason for Consultation:  cholangitis ?  ? ?History of Present Illness  ? ?Terry Hernandez is a 88 y.o. male with past medical history significant for hypertension, adenocarcinoma of either of the pancreas or cholangiocarcinoma arriving to the ED yesterday via EMS from home with fever, weakness.   ? ?According to son Terry Hernandez), patient has had fever a couple times the past 10 to 14 days.  Initially temp as high as 104.  Saw PCP and noted to have elevated LFTs. There was concern for possible UTI, initially cannot produce specimen, he was given antibiotic which she took for 1 day but once urine specimen obtained, urinalysis/culture returned antibiotics were stopped.  Over the past couple days he has had progressive fatigue, recurrent fever, diminished appetite.  Given that he was not improving at home son encouraged ED evaluation again.  Patient agreed.   ? ?Patient seen by GI in November 2022 during admission for obstructive jaundice, abnormal pancreas on prior CT, melena/anemia/heme positive stools.  Underwent EGD and ERCP as outlined below.  Biliary stent was placed with plans to remove in 6 to 8 weeks.  Unfortunately patient's jaundice/obstruction did not improve, cytology revealed adenocarcinoma which was suspected to be either from pancreatic head tumor versus cholangiocarcinoma.  He had repeat ERCP with uncovered metal stent placement October 30, 2021. ? ?Followed up by oncology, Dr. Betsy Coder, thus far patient has elected to not pursue treatment for his cancer.  He was so weak initially and lost a substantial amount of weight that it was not  felt that he would tolerate chemotherapy.  There were plans for MRI to try to better determine site of primary adenocarcinoma but to date this has not been done.  Last point of contact March 16, 2022 with oncology navigator offering follow-up appointment if he desires. ? ?Son reports that patient actually started doing a little bit better after January.  He started putting back on some weight.  Getting stronger.  Has gained about a 20 pounds per son.  Patient states he has had no abdominal pain.  No heartburn or dysphagia.  Bowel movements have been regular.  No blood in the stool or melena.  Biggest complaint is feeling weak. ? ?CT abdomen pelvis with contrast earlier today showed improved moderate intra and extrahepatic biliary ductal dilation compared to November 2022.  Metallic internal biliary stent of distal extrahepatic biliary stricture within the pancreatic head with minimal diameter of the stented segment of the extrahepatic bile duct approximately 4 mm.  New scattered areas of nodular enhancement seen peripherally within the liver somewhat nonspecific could be seen with infectious conditions such as cholangitis versus vascular shunting versus micrometastatic disease.  Fullness within the head of the pancreas again noted without discrete mass.  Mild circumferential wall thickening and hyperemia of the gastric antrum.  Consider contrast-enhanced MRI. ? ?December 30, 2021, LFTs unremarkable except for alk phos of 162.  March 30, 2022 alk phos up to 621, AST 79, ALT 49.   ? ?Today total bilirubin 2.2, alk phos 605, AST 150, ALT 60.  His white blood cells are 20,300, hemoglobin 9.2 relatively stable, platelets normal.  Lactic acid initially 3.9, down to 3.0 today.  Urinalysis with RBCs 6-10, WBC 0-5, rare bacteria, negative nitrite and leukocytes not suggestive of UTI.  Last week PCP performed urine culture which was unremarkable.  Blood cultures pending however blood was not obtained prior to initiation of  antibiotics.  SARS coronavirus 2 negative. ? ? ?ERCP 10/30/2021, obstruction not relieved with plastic stent, newly diagnosed pancreatic head tumor versus cholangiocarcinoma ?- A previously placed stent was removed as above. ?- One stent was removed from the biliary tree as above. ?- One uncovered metal stent was placed into the common bile duct. ? ?ERCP 10/27/2021 ?The major papilla appeared normal. ?- A biliary sphincterotomy was performed. ?- Cells for cytology obtained in the lower third of the main duct.  Cytology showed malignant cells consistent with adenocarcinoma ?- The biliary tree was swept to better delineate the stricture. ?- One plastic stent was placed into the common bile duct. ?  ?EGD 10/27/2021 ?-Tiny hiatal hernia. ?- A small amount of food (residue) in the stomach. ?- Normal ampulla, duodenal bulb, first portion of the duodenum, second portion of the ?duodenum, major papilla and third portion of the duodenum. ?- The examination was otherwise normal. ?- No specimens collected. ? ? ?  ?Prior to Admission medications   ?Medication Sig Start Date End Date Taking? Authorizing Provider  ?acetaminophen (TYLENOL) 325 MG tablet Take 650 mg by mouth every 6 (six) hours as needed for mild pain or fever.   Yes [provider]  ?metoprolol succinate (TOPROL-XL) 25 MG 24 hr tablet Take 1.5 tablets (37.5 mg total) by mouth daily. 01/14/22  Yes Buford Dresser, MD  ?mirtazapine (REMERON SOL-TAB) 15 MG disintegrating tablet Take 0.5 tablets (7.5 mg total) by mouth at bedtime. 11/17/21  Yes Roxan Hockey, MD  ?Multiple Vitamin (MULTIVITAMIN) tablet Take 1 tablet by mouth at bedtime.    Yes [provider]  ?pantoprazole (PROTONIX) 40 MG tablet Take 1 tablet (40 mg total) by mouth daily. 10/29/21 04/09/22 Yes Aline August, MD  ?senna-docusate (SENOKOT-S) 8.6-50 MG tablet Take 2 tablets by mouth at bedtime. ?Patient taking differently: Take 2 tablets by mouth at bedtime as needed for mild  constipation. 11/17/21 11/17/22 Yes Emokpae, Courage, MD  ?silodosin (RAPAFLO) 4 MG CAPS capsule TAKE 1 CAPSULE BY MOUTH EVERY MORNING WITH BREAKFAST 02/12/22  Yes Irine Seal, MD  ?traMADol (ULTRAM) 50 MG tablet Take 50 mg by mouth every 6 (six) hours as needed for moderate pain. 01/14/22  Yes [provider]  ?amoxicillin (AMOXIL) 875 MG tablet Take 1 tablet (875 mg total) by mouth 2 (two) times daily for 7 days. ?Patient not taking: Reported on 04/09/2022 04/01/22 04/09/22  [name], FNP  ?feeding supplement (ENSURE ENLIVE / ENSURE PLUS) LIQD Take 237 mLs by mouth 3 (three) times daily between meals. 11/17/21   Roxan Hockey, MD  ?methocarbamol (ROBAXIN) 500 MG tablet Take 1 tablet (500 mg total) by mouth every 6 (six) hours as needed for muscle spasms. ?Patient not taking: Reported on 04/09/2022 03/19/20   Kristeen Miss, MD  ?mirtazapine (REMERON) 15 MG tablet TAKE 1/2 TABLET BY MOUTH AT BEDTIME ?Patient not taking: Reported on 04/09/2022 03/18/22   [name], FNP  ?ASA 44m daily ? ?Current Facility-Administered Medications  ?Medication Dose Route Frequency Provider Last Rate Last Admin  ? 0.9 %  sodium chloride infusion   Intravenous Continuous Zierle-Ghosh, Asia B, DO 100 mL/hr at 04/09/22 0305 New Bag at 04/09/22 0305  ? acetaminophen (TYLENOL) tablet 650  mg  650 mg Oral Q6H PRN Zierle-Ghosh, Asia B, DO      ? Or  ? acetaminophen (TYLENOL) suppository 650 mg  650 mg Rectal Q6H PRN Zierle-Ghosh, Asia B, DO      ? heparin injection 5,000 Units  5,000 Units Subcutaneous Q8H Zierle-Ghosh, Asia B, DO   5,000 Units at 04/09/22 8727  ? metoprolol succinate (TOPROL-XL) 24 hr tablet 37.5 mg  37.5 mg Oral Daily Zierle-Ghosh, Asia B, DO      ? mirtazapine (REMERON) tablet 7.5 mg  7.5 mg Oral QHS Zierle-Ghosh, Asia B, DO      ? morphine (PF) 2 MG/ML injection 2 mg  2 mg Intravenous Q2H PRN Zierle-Ghosh, Asia B, DO      ? ondansetron (ZOFRAN) tablet 4 mg  4 mg Oral Q6H PRN Zierle-Ghosh, Asia B, DO      ? Or   ? ondansetron (ZOFRAN) injection 4 mg  4 mg Intravenous Q6H PRN Zierle-Ghosh, Asia B, DO      ? oxyCODONE (Oxy IR/ROXICODONE) immediate release tablet 5 mg  5 mg Oral Q4H PRN Zierle-Ghosh, Asia B, DO

## 2022-04-09 NOTE — Assessment & Plan Note (Signed)
-   Secondary to poor p.o. intake and deconditioning ?-PT eval and treat ?-May need home PT ?

## 2022-04-09 NOTE — H&P (Signed)
?History and Physical  ? ? ?Patient: Terry Hernandez KGU:[id number] DOB: [birth year] ?DOA: 04/08/2022 ?DOS: the patient was seen and examined on 04/09/2022 ?PCP: [name], FNP  ?Patient coming from: Home ? ?Chief Complaint:  ?Chief Complaint  ?Patient presents with  ? Fever  ? ?HPI: Terry Hernandez is a 88 y.o. male with medical history significant of with history of GERD, hyperlipidemia, hypertension, biliary adenocarcinoma, and more presents ED with a chief complaint of fever.  Patient is mildly confused at this time.  He initially says he is here because his back was hurting, and then reports that his back does not bother him much.  When asked if he had a fever he said he thinks he has had a long for a couple years.  Son is at bedside and fills in some of the gaps.  He reports that the patient has had an intermittent fever over the last 2 weeks.  Sunday before last the fever was as high as 104.  They saw the PCP that Monday and liver enzymes are slightly elevated.  There was some concern that maybe he had a UTI, but the UA came back with an elevated specific gravity, 3+ protein, few bacteria, casts present, and 0-5 white blood cells.  Patient was taken Tylenol for intermittent fever and it helped.  Today patient was fatigued and cold throughout the day and would not do much at home which is abnormal for him.  This is the reason the son brought him into the ED.  Son reports that back in November and December patient was quite deconditioned, generalized weakness, and requiring home O2.  He got better in January and February started eating more, gained weight, and became his normal active self.  Patient ambulates with a walker with crutches depending on if he is in the house or going out somewhere.  He is normally up and about.  Went son could not get patient to to get up today and he just wanted to lay down so new something was wrong.  Patient denies any abdominal pain, nausea, vomiting, diarrhea.  He does admit  to some knee pain which has been going on for quite some time and Tylenol does help.  This is related to his arthritis.  Patient has a history of A-fib with RVR, but has not had any palpitations recently.  Patient does have a history of biliary adenocarcinoma that was diagnosed at the end of 2022.  Patient reports that he saw his first wife go through chemotherapy and he has no interest in ever doing chemo.  Son reports that they told him he was too weak to do chemo and if he wanted to at that time.  Stated palliative biliary stent placed. ? ?On chart review it looks like that hospitalization started on November 16 where he was diagnosed with biliary adenocarcinoma.  He presented to the hospital at that time for jaundice, weight loss, and melena.  He had elevated LFTs.  CT of the abdomen pelvis showed a common bile duct stricture.  He had an EGD and ERCP which showed no bleeding source but a common bile duct stricture was found and a stent was placed at that time.  Brush biopsies were taken at that time and came back showing malignant cells consistent with adenocarcinoma.  Patient had a UTI at that time and was ultimately put on a 10-day course of Augmentin.  Due to obstructive uropathy he was discharged with an indwelling Foley at that  time.  Bilirubin and LFTs were improving when he was discharged. ? ?Today patient has no further complaints.  He reports that he does not smoke, does not drink alcohol, does not use illicit drugs.  He is vaccinated for COVID.  Patient is DNR. ?Review of Systems: unable to review all systems due to the inability of the patient to answer questions.  Patient is not able to provide reliable review of systems secondary to confusion. ?Past Medical History:  ?Diagnosis Date  ? Arthritis   ? GERD (gastroesophageal reflux disease)   ? PMH  ? Glucose intolerance (impaired glucose tolerance)   ? High cholesterol   ? History of kidney stones   ? HTN (hypertension)   ? Hyperlipidemia   ? Lumbar  stenosis with neurogenic claudication   ? Pneumonia   ? Subcutaneous mass   ? Wears glasses   ? ?Past Surgical History:  ?Procedure Laterality Date  ? APPENDECTOMY    ? BILIARY BRUSHING  10/26/2021  ? Procedure: BILIARY BRUSHING;  Surgeon: Clarene Essex, MD;  Location: [facility] ENDOSCOPY;  Service: Endoscopy;;  ? BILIARY STENT PLACEMENT  10/26/2021  ? Procedure: BILIARY STENT PLACEMENT;  Surgeon: Clarene Essex, MD;  Location: [facility] ENDOSCOPY;  Service: Endoscopy;;  ? BILIARY STENT PLACEMENT  10/30/2021  ? Procedure: BILIARY STENT PLACEMENT;  Surgeon: Clarene Essex, MD;  Location: [place];  Service: Endoscopy;;  ? COLONOSCOPY W/ BIOPSIES AND POLYPECTOMY    ? ERCP N/A 10/26/2021  ? Procedure: ENDOSCOPIC RETROGRADE CHOLANGIOPANCREATOGRAPHY (ERCP);  Surgeon: Clarene Essex, MD;  Location: [place];  Service: Endoscopy;  Laterality: N/A;  ? ERCP N/A 10/30/2021  ? Procedure: ENDOSCOPIC RETROGRADE CHOLANGIOPANCREATOGRAPHY (ERCP);  Surgeon: Clarene Essex, MD;  Location: [place];  Service: Endoscopy;  Laterality: N/A;  ? ESOPHAGOGASTRODUODENOSCOPY N/A 10/26/2021  ? Procedure: ESOPHAGOGASTRODUODENOSCOPY (EGD);  Surgeon: Clarene Essex, MD;  Location: [place];  Service: Endoscopy;  Laterality: N/A;  ? LUMBAR LAMINECTOMY/DECOMPRESSION MICRODISCECTOMY Bilateral 03/18/2020  ? Procedure: Bilateral Lumbar three-four Lumbar four-five Laminotomy/foraminotomy;  Surgeon: Kristeen Miss, MD;  Location: [place];  Service: Neurosurgery;  Laterality: Bilateral;  ? MASS EXCISION Right 03/18/2020  ? Procedure: Excision of a Right gluteal subcutaneous mass;  Surgeon: Kristeen Miss, MD;  Location: [place];  Service: Neurosurgery;  Laterality: Right;  ? MULTIPLE TOOTH EXTRACTIONS    ? SPHINCTEROTOMY  10/26/2021  ? Procedure: SPHINCTEROTOMY;  Surgeon: Clarene Essex, MD;  Location: [facility] ENDOSCOPY;  Service: Endoscopy;;  ? STENT REMOVAL  10/30/2021  ? Procedure: STENT REMOVAL;  Surgeon: Clarene Essex, MD;  Location: [facility] ENDOSCOPY;  Service: Endoscopy;;  ? ?Social  History:  reports that he has never smoked. He has never used smokeless tobacco. He reports that he does not drink alcohol and does not use drugs. ? ?Allergies  ?Allergen Reactions  ? Cephalexin Rash  ?  REACTION: rash  ? ? ?Family History  ?Problem Relation Age of Onset  ? Cancer Mother   ? Stroke Father   ? Cancer Son   ?     lung  ? ? ?Prior to Admission medications   ?Medication Sig Start Date End Date Taking? Authorizing Provider  ?feeding supplement (ENSURE ENLIVE / ENSURE PLUS) LIQD Take 237 mLs by mouth 3 (three) times daily between meals. 11/17/21   Roxan Hockey, MD  ?methocarbamol (ROBAXIN) 500 MG tablet Take 1 tablet (500 mg total) by mouth every 6 (six) hours as needed for muscle spasms. 03/19/20   Kristeen Miss, MD  ?metoprolol succinate (TOPROL-XL) 25 MG 24 hr tablet Take 1.5 tablets (  37.5 mg total) by mouth daily. 01/14/22   Buford Dresser, MD  ?mirtazapine (REMERON SOL-TAB) 15 MG disintegrating tablet Take 0.5 tablets (7.5 mg total) by mouth at bedtime. 11/17/21   Roxan Hockey, MD  ?mirtazapine (REMERON) 15 MG tablet TAKE 1/2 TABLET BY MOUTH AT BEDTIME 03/18/22   [name], FNP  ?Multiple Vitamin (MULTIVITAMIN) tablet Take 1 tablet by mouth at bedtime.     [provider]  ?pantoprazole (PROTONIX) 40 MG tablet Take 1 tablet (40 mg total) by mouth daily. 10/29/21 11/28/21  Aline August, MD  ?senna-docusate (SENOKOT-S) 8.6-50 MG tablet Take 2 tablets by mouth at bedtime. 11/17/21 11/17/22  Roxan Hockey, MD  ?silodosin (RAPAFLO) 4 MG CAPS capsule TAKE 1 CAPSULE BY MOUTH EVERY MORNING WITH BREAKFAST 02/12/22   Irine Seal, MD  ?traMADol (ULTRAM) 50 MG tablet Take 50 mg by mouth every 6 (six) hours as needed. 01/14/22   [provider]  ? ? ?Physical Exam: ?Vitals:  ? 04/09/22 0000 04/09/22 0020 04/09/22 0100 04/09/22 0259  ?BP: (!) 98/58 104/89 110/78 123/70  ?Pulse: 79 80 75 88  ?Resp: 18 17 (!) 21 19  ?Temp:    97.8 ?F (36.6 ?C)  ?TempSrc:    Oral  ?SpO2: 100% 97% 97%  98%  ?Weight:    68.9 kg  ?Height:    '5\' 11"'$  (1.803 m)  ? ?1.  General: ?Patient lying supine in bed,  no acute distress ?  ?2. Psychiatric: ?Alert and oriented x person, place, time, but not context, moo

## 2022-04-09 NOTE — TOC Initial Note (Signed)
Transition of Care (TOC) - Initial/Assessment Note  ? ? ?Patient Details  ?Name: Terry Hernandez ?MRN: [id number] ?Date of Birth: October 24, 1934 ? ?Transition of Care (TOC) CM/SW Contact:    ?Iona Beard, LCSWA ?Phone Number: ?04/09/2022, 11:05 AM ? ?Clinical Narrative:                 ?TOC consulted for possible HH needs. CSW met with pt and son in room to complete assessment. Pt lives with his wife and has family next door and nearby to assist if needed. Pt has a rollator, shower chair, and walker to use in the home when needed. Pt has had [place] services with [facility] in the past but is not currently active. Pt and son are agreeable to [facility] services being started up at D/C. Pt previously had O2 with [employer] however pts son states it has been picked up by the company as pt was not using it. TOC to follow.  ? ?Expected Discharge Plan: [place] ?Barriers to Discharge: Continued Medical Work up ? ? ?Patient Goals and CMS Choice ?Patient states their goals for this hospitalization and ongoing recovery are:: Return home ?CMS Medicare.gov Compare Post Acute Care list provided to:: Patient ?Choice offered to / list presented to : Patient, Adult Children ? ?Expected Discharge Plan and Services ?Expected Discharge Plan: [place] ?  ?  ?Post Acute Care Choice: Home Health ?Living arrangements for the past 2 months: [place] ?                ?  ?  ?  ?  ?  ?  ?  ?  ?  ?  ? ?Prior Living Arrangements/Services ?Living arrangements for the past 2 months: [place] ?Lives with:: Spouse ?Patient language and need for interpreter reviewed:: Yes ?Do you feel safe going back to the place where you live?: Yes      ?Need for Family Participation in Patient Care: Yes (Comment) ?Care giver support system in place?: Yes (comment) ?Current home services: DME ?Criminal Activity/Legal Involvement Pertinent to Current Situation/Hospitalization: No - Comment as needed ? ?Activities of Daily  Living ?Home Assistive Devices/Equipment: [place] Rile (specify type), Shower chair with back (Rollator) ?ADL Screening (condition at time of admission) ?Patient's cognitive ability adequate to safely complete daily activities?: Yes ?Is the patient deaf or have difficulty hearing?: No ?Does the patient have difficulty seeing, even when wearing glasses/contacts?: No ?Does the patient have difficulty concentrating, remembering, or making decisions?: No ?Patient able to express need for assistance with ADLs?: Yes ?Does the patient have difficulty dressing or bathing?: No ?Independently performs ADLs?: No ?Communication: Independent ?Dressing (OT): Needs assistance ?Is this a change from baseline?: Pre-admission baseline ?Grooming: Needs assistance ?Is this a change from baseline?: Pre-admission baseline ?Feeding: Independent ?Bathing: Independent ?Toileting: Independent ?In/Out Bed: Independent ?Walks in Home: Independent with device (comment) ?Does the patient have difficulty walking or climbing stairs?: No ?Weakness of Legs: Right ?Weakness of Arms/Hands: Left ? ?Permission Sought/Granted ?  ?  ?   ?   ?   ?   ? ?Emotional Assessment ?Appearance:: Appears stated age ?Attitude/Demeanor/Rapport: Engaged ?Affect (typically observed): Accepting ?Orientation: : Oriented to Self, Oriented to Place, Oriented to  Time, Oriented to Situation ?Alcohol / Substance Use: Not Applicable ?Psych Involvement: No (comment) ? ?Admission diagnosis:  Cholangitis [K83.09] ?Patient Active Problem List  ? Diagnosis Date Noted  ? Cholangitis 04/09/2022  ? Hypokalemia 04/09/2022  ? Sepsis ([place]) 04/09/2022  ?  Malnutrition of moderate degree 11/17/2021  ? E Coli Acute lower UTI 11/15/2021  ? Pleural effusion 11/15/2021  ? Physical deconditioning 11/15/2021  ? Transaminitis 11/15/2021  ? Obstructive uropathy 11/15/2021  ? Lactic acidosis 11/15/2021  ? Failure to thrive in adult 11/15/2021  ? Hypoalbuminemia due to protein-calorie malnutrition ([place])  11/15/2021  ? History of pancreatitis 11/15/2021  ? FTT (failure to thrive) in adult 11/15/2021  ? Generalized weakness 11/14/2021  ? Atrial fibrillation ([place]) 11/04/2021  ? Hyperbilirubinemia 10/24/2021  ? Common bile duct (CBD) stricture 10/24/2021  ? Essential hypertension   ? GERD (gastroesophageal reflux disease)   ? Guaiac positive stools   ? AKI (acute kidney injury) ([place])   ? Leukocytosis   ? Elevated alkaline phosphatase level   ? Macrocytic anemia   ? Hyperglycemia   ? Hyponatremia   ? Hypotension   ? Abnormal weight loss   ? Jaundice   ? Lumbar stenosis with neurogenic claudication 03/18/2020  ? Lumbar spinal stenosis 03/18/2020  ? PREMATURE ATRIAL CONTRACTIONS 09/04/2009  ? PALPITATIONS 09/04/2009  ? Mixed hyperlipidemia 09/03/2009  ? ?PCP:  [name], FNP ?Pharmacy:   ?[facility] - [address] [place], [address] ?[address] ?[place] [address] ?Phone: [phone] Fax: [phone] ? ? ? ? ?Social Determinants of Health (SDOH) Interventions ?  ? ?Readmission Risk Interventions ?   ? View : No data to display.  ?  ?  ?  ? ? ? ?

## 2022-04-09 NOTE — ED Provider Notes (Signed)
?  Physical Exam  ?BP 110/78   Pulse 75   Temp 98.6 ?F (37 ?C) (Oral)   Resp (!) 21   Ht '5\' 11"'$  (1.803 m)   Wt 69.1 kg   SpO2 97%   BMI 21.23 kg/m?  ? ?Physical Exam ?Vitals and nursing note reviewed.  ?Constitutional:   ?   General: He is not in acute distress. ?   Appearance: He is well-developed. He is not diaphoretic.  ?HENT:  ?   Head: Normocephalic and atraumatic.  ?Cardiovascular:  ?   Rate and Rhythm: Normal rate and regular rhythm.  ?   Heart sounds: No murmur heard. ?  No friction rub.  ?Pulmonary:  ?   Effort: Pulmonary effort is normal. No respiratory distress.  ?   Breath sounds: Normal breath sounds. No wheezing or rales.  ?Abdominal:  ?   General: Bowel sounds are normal. There is no distension.  ?   Palpations: Abdomen is soft.  ?   Tenderness: There is no abdominal tenderness.  ?Musculoskeletal:     ?   General: Normal range of motion.  ?   Cervical back: Normal range of motion and neck supple.  ?Skin: ?   General: Skin is warm and dry.  ?Neurological:  ?   Mental Status: He is alert and oriented to person, place, and time.  ?   Coordination: Coordination normal.  ? ? ?Procedures  ?Procedures ? ?ED Course / MDM  ? ?Care assumed from Dr. Vanita Panda at shift change.  Patient is an 88 year old male presenting with fever and weakness.  He has history of possible biliary neoplasm and has a biliary stent in place.  Care signed out to me awaiting results of laboratory studies and urinalysis. ? ?Laboratory studies have returned with a white count of 16.6 along with elevations of his LFTs above baseline.  He also has a lactate of 3.9.  After receiving 1 L of fluid, this was repeated and is now 3.6.  Due to his history of fever, leukocytosis, worsening LFTs and elevated lactate, I obtained a CT scan to further evaluate his biliary tree.  This does have findings that could be consistent with cholangitis.  Patient given Zosyn and I feel will require admission. ? ?I have spoken with Dr. Clearence Ped who  agrees to admit. ? ? ? ? ?  ?Veryl Speak, MD ?04/09/22 2878 ? ?

## 2022-04-09 NOTE — Care Management Important Message (Signed)
Important Message ? ?Patient Details  ?Name: Terry Hernandez ?MRN: [id number] ?Date of Birth: December 24, 1933 ? ? ?Medicare Important Message Given:  N/A - LOS <3 / Initial given by admissions ? ? ? ? ?[name] ?04/09/2022, 11:27 AM ?

## 2022-04-09 NOTE — Assessment & Plan Note (Signed)
-   Patient has had fever with a Tmax of 104 reported, jaundice with T. bili of 2.2, he is confused at this time that could also be from having been up all night in the ER, without any hypotension ?-CT scan calls cholangitis ?-Patient started on Zosyn ?-GI consulted ?

## 2022-04-09 NOTE — Assessment & Plan Note (Signed)
-   Potassium 2.9 at admission ?-40 mEq potassium given potassium improved to 3.8 ?-Likely potassium was low secondary to poor p.o. intake ?-Currently restricting to a clear liquid diet until seen and cleared by GI, but when patient is able to take p.o. encourage nutrient dense food choices ?

## 2022-04-10 LAB — COMPREHENSIVE METABOLIC PANEL
ALT: 43 U/L (ref 0–44)
AST: 70 U/L — ABNORMAL HIGH (ref 15–41)
Albumin: 2.3 g/dL — ABNORMAL LOW (ref 3.5–5.0)
Alkaline Phosphatase: 435 U/L — ABNORMAL HIGH (ref 38–126)
Anion gap: 7 (ref 5–15)
BUN: 21 mg/dL (ref 8–23)
CO2: 22 mmol/L (ref 22–32)
Calcium: 7.8 mg/dL — ABNORMAL LOW (ref 8.9–10.3)
Chloride: 110 mmol/L (ref 98–111)
Creatinine, Ser: 1.54 mg/dL — ABNORMAL HIGH (ref 0.61–1.24)
GFR, Estimated: 44 mL/min — ABNORMAL LOW (ref >60)
Glucose, Bld: 117 mg/dL — ABNORMAL HIGH (ref 70–99)
Potassium: 3.5 mmol/L (ref 3.5–5.1)
Sodium: 139 mmol/L (ref 135–145)
Total Bilirubin: 1.7 mg/dL — ABNORMAL HIGH (ref 0.3–1.2)
Total Protein: 5.6 g/dL — ABNORMAL LOW (ref 6.5–8.1)

## 2022-04-10 LAB — CBC WITH DIFFERENTIAL/PLATELET
Abs Immature Granulocytes: 0.09 10*3/uL — ABNORMAL HIGH (ref 0.00–0.07)
Basophils Absolute: 0.1 10*3/uL (ref 0.0–0.1)
Basophils Relative: 0 %
Eosinophils Absolute: 0.7 10*3/uL — ABNORMAL HIGH (ref 0.0–0.5)
Eosinophils Relative: 5 %
HCT: 27.4 % — ABNORMAL LOW (ref 39.0–52.0)
Hemoglobin: 8.6 g/dL — ABNORMAL LOW (ref 13.0–17.0)
Immature Granulocytes: 1 %
Lymphocytes Relative: 13 %
Lymphs Abs: 1.8 10*3/uL (ref 0.7–4.0)
MCH: 31.7 pg (ref 26.0–34.0)
MCHC: 31.4 g/dL (ref 30.0–36.0)
MCV: 101.1 fL — ABNORMAL HIGH (ref 80.0–100.0)
Monocytes Absolute: 0.6 10*3/uL (ref 0.1–1.0)
Monocytes Relative: 4 %
Neutro Abs: 10.4 10*3/uL — ABNORMAL HIGH (ref 1.7–7.7)
Neutrophils Relative %: 77 %
Platelets: 294 10*3/uL (ref 150–400)
RBC: 2.71 MIL/uL — ABNORMAL LOW (ref 4.22–5.81)
RDW: 15.4 % (ref 11.5–15.5)
WBC: 13.5 10*3/uL — ABNORMAL HIGH (ref 4.0–10.5)
nRBC: 0 % (ref 0.0–0.2)

## 2022-04-10 LAB — LACTIC ACID, PLASMA: Lactic Acid, Venous: 1.3 mmol/L (ref 0.5–1.9)

## 2022-04-10 LAB — PROCALCITONIN: Procalcitonin: 7.66 ng/mL

## 2022-04-10 NOTE — TOC Progression Note (Signed)
Transition of Care (TOC) - Progression Note  ? ? ?Patient Details  ?Name: MAXTON NOREEN ?MRN: [id number] ?Date of Birth: 1934/01/26 ? ?Transition of Care (TOC) CM/SW Contact  ?Shade Flood, LCSW ?Phone Number: ?04/10/2022, 11:47 AM ? ?Clinical Narrative:    ? ?TOC following. PT recommending HH PT. Pt was referred to [place] at request of pt's son. Pt was active with [facility] for [place] after his previous hospital dc. Sarah at [facility] accepted referral. ? ?MD anticipating dc in 1-2 days. Asked for [place] orders for dc.  ? ?TOC will follow. ? ?Expected Discharge Plan: [place] ?Barriers to Discharge: Continued Medical Work up ? ?Expected Discharge Plan and Services ?Expected Discharge Plan: [place] ?In-house Referral: Clinical Social Work ?  ?Post Acute Care Choice: Home Health ?Living arrangements for the past 2 months: [place] ?                ?  ?  ?  ?  ?  ?HH Arranged: RN, PT, OT ?[place] Agency: [facility] ?Date HH Agency Contacted: 04/09/22 ?  ?Representative spoke with at [place]: Judson Roch ? ? ?Social Determinants of Health (SDOH) Interventions ?  ? ?Readmission Risk Interventions ?   ? View : No data to display.  ?  ?  ?  ? ? ?

## 2022-04-10 NOTE — Care Management Important Message (Signed)
Important Message ? ?Patient Details  ?Name: Terry Hernandez ?MRN: [id number] ?Date of Birth: 04/04/1934 ? ? ?Medicare Important Message Given:  Yes ? ? ? ? ?[name] ?04/10/2022, 1:24 PM ?

## 2022-04-10 NOTE — Progress Notes (Signed)
? ?Patient: Terry Hernandez TSV:[id number] DOB: 11/09/1934 ?DOA: 04/08/2022 ?DOS: the patient was seen and examined on 04/10/2022 ?PCP: [name], FNP  ?Patient coming from: Home ? ? ?Chief Complaint  ?Patient presents with  ? Fever  ? ?HPI: [name] is a 88 y.o. male with medical history significant of with history of GERD, hyperlipidemia, hypertension, biliary adenocarcinoma, and more presents ED with a chief complaint of fever.  Patient is mildly confused at this time.  He initially says he is here because his back was hurting, and then reports that his back does not bother him much.  When asked if he had a fever he said he thinks he has had a long for a couple years.  Son is at bedside and fills in some of the gaps.  He reports that the patient has had an intermittent fever over the last 2 weeks.  Sunday before last the fever was as high as 104.  They saw the PCP that Monday and liver enzymes are slightly elevated.  There was some concern that maybe he had a UTI, but the UA came back with an elevated specific gravity, 3+ protein, few bacteria, casts present, and 0-5 white blood cells.  Patient was taken Tylenol for intermittent fever and it helped.  Today patient was fatigued and cold throughout the day and would not do much at home which is abnormal for him.  This is the reason the son brought him into the ED.  Son reports that back in November and December patient was quite deconditioned, generalized weakness, and requiring home O2.  He got better in January and February started eating more, gained weight, and became his normal active self.  Patient ambulates with a walker with crutches depending on if he is in the house or going out somewhere.  He is normally up and about.  Went son could not get patient to to get up today and he just wanted to lay down so new something was wrong.  Patient denies any abdominal pain, nausea, vomiting, diarrhea.  He does admit to some knee pain which has been going on  for quite some time and Tylenol does help.  This is related to his arthritis.  Patient has a history of A-fib with RVR, but has not had any palpitations recently.  Patient does have a history of biliary adenocarcinoma that was diagnosed at the end of 2022.  Patient reports that he saw his first wife go through chemotherapy and he has no interest in ever doing chemo.  Son reports that they told him he was too weak to do chemo and if he wanted to at that time.  Stated palliative biliary stent placed. ? ?On chart review it looks like that hospitalization started on November 16 where he was diagnosed with biliary adenocarcinoma.  He presented to the hospital at that time for jaundice, weight loss, and melena.  He had elevated LFTs.  CT of the abdomen pelvis showed a common bile duct stricture.  He had an EGD and ERCP which showed no bleeding source but a common bile duct stricture was found and a stent was placed at that time.  Brush biopsies were taken at that time and came back showing malignant cells consistent with adenocarcinoma.  Patient had a UTI at that time and was ultimately put on a 10-day course of Augmentin.  Due to obstructive uropathy he was discharged with an indwelling Foley at that time.  Bilirubin and LFTs were improving  when he was discharged. ? ?Today patient has no further complaints.  He reports that he does not smoke, does not drink alcohol, does not use illicit drugs.  He is vaccinated for COVID.  Patient is DNR. ? ?Subjective ?Feels much better today.  Son at bedside. ? ? ?Physical Exam: ?Vitals:  ? 04/09/22 1311 04/09/22 2118 04/10/22 0406 04/10/22 0831  ?BP: (!) 105/54 115/65 92/61 113/63  ?Pulse: 76 77 88 82  ?Resp: 18   18  ?Temp: 98.7 ?F (37.1 ?C) 98.6 ?F (37 ?C)    ?TempSrc: Oral Oral    ?SpO2: 98% 91% 90% 90%  ?Weight:      ?Height:      ? ?1.  General: ?Patient lying supine in bed,  no acute distress ?  ?2. Psychiatric: ?Alert and oriented x person, place, time, but not context, mood  and behavior normal for situation, pleasant and cooperative with exam ?  ?3. Neurologic: ?Speech and language are normal, face is symmetric, moves all 4 extremities voluntarily, at baseline without acute deficits on limited exam ?  ?4. HEENMT:  ?Head is atraumatic, normocephalic, pupils reactive to light, neck is supple, trachea is midline, mucous membranes are moist ?  ?5. Respiratory : ?Lungs are clear to auscultation bilaterally without wheezing, rhonchi, rales, no cyanosis, no increase in work of breathing or accessory muscle use ?  ?6. Cardiovascular : ?Heart rate tachycardic, rhythm is irregularly irregular, no murmurs, rubs or gallops, no peripheral edema, peripheral pulses palpated ?  ?7. Gastrointestinal:  ?Abdomen is soft, nondistended, nontender to palpation bowel sounds active, there is a fullness in his right upper quadrant, but no  tenderness to palpation, no overt nodules or discrete masses  ? ?8. Skin:  ?Skin is warm, dry and intact without rashes, acute lesions, or ulcers on limited exam ?  ?9.Musculoskeletal:  ?No acute deformities or trauma, no asymmetry in tone, no peripheral edema, peripheral pulses palpated, no tenderness to palpation in the extremities ? ?Data Reviewed: ?In the ED ?Temp 98.6, heart rate 75-93, respiratory rate 17-23, blood pressure 110/78, satting normally on 2 L nasal cannula ?Of note patient has had oxygen in the past in December.  He was taken off of it in January or February.  There are no documented hypoxic events in the ED ? ?Patient has a leukocytosis 16.6, hemoglobin 9.6 ?Chemistry reveals a hypokalemia 2.9, slightly elevated creatinine at baseline 1.33, elevated glucose 181 ?Alk phos is elevated at 694, AST 195, ALT 67 ?Albumin 2.8 ? UA is not indicative of UTI ?CT abdomen pelvis shows biliary stent with improvement of intra and extrahepatic ductal dilation, cholangitis and recommending contrast-enhanced MRI ?Normal saline bolus 1500 ?Lactic acid 3.9,  3.6 ? ? ?Assessment and Plan: ?* Cholangitis ?- Patient has had fever with a Tmax of 104 reported, jaundice with T. bili of 2.2, he is confused at this time that could also be from having been up all night in the ER, without any hypotension ?-CT scan calls cholangitis ?-Patient started on Zosyn ?-GI consulted ?-Fever resolved ?-MRCP nonspecific foci numerous in the liver could be infection versus metastatic disease, mild biliary duct dilation common bile duct stent in place ?-Clinically improving ? ?Sepsis ([place]) ?-Heart rate 93, respiratory rate 23, leukocytosis -16.6, lactic acidosis 3.9 cholangitis on CT ?-UA is not indicative of UTI ?-1500 mL bolus in ED ?-Zosyn started in ED, continue ?-Unfortunately blood cultures were not taken before antibiotics ?-Procalcitonin 7.03 on admit, repeat Pro-Cal ?-Lactic acidosis resolved ? ? ?Hypokalemia ?-  Potassium 2.9 at admission ?-40 mEq potassium given potassium improved to 3.8 ?-Likely potassium was low secondary to poor p.o. intake ?-Currently restricting to a clear liquid diet until seen and cleared by GI, but when patient is able to take p.o. encourage nutrient dense food choices ? ?Hypoalbuminemia due to protein-calorie malnutrition ([place]) ?- Albumin 2.8 ?-Secondary to poor p.o. intake ?-Restart feeding supplement when patient is able to take p.o. ? ?Lactic acidosis ?- Initial lactic acid 3.9, improved to 3.6 after 1500 mL bolus ?-Continue IV hydration ?-Trend lactic acid this a.m. ? ?Transaminitis ?-AST 195, ALT 67 ?-This is up from April 17 with an AST of 79 and ALT of 49 ?-In the setting of elevated total bili which is increased from 0.8 on April 17, to 2.2 today ?-CT abdomen pelvis shows extrahepatic biliary stenting with improvement of intra and extrahepatic ductal dilation.  Minimal diameter of the extrahepatic bile duct is approximately 4 mm.  Interval development of nodular enhancement throughout the liver is nonspecific, Most likely cholangitis.   Contrast-enhanced MRI evaluation is recommended.  Thickening and hyperemia of the gastric antrum which is possibly infectious or inflammatory is also noted ?-Patient was started on Zosyn ?-Continue Zosyn ?-Continue to monitor transa

## 2022-04-10 NOTE — Progress Notes (Addendum)
Patient continues to feel well; denies abdominal pain. ?He is accompanied by his son, Terry Hernandez [phone]) ? ?Vital signs in last 24 hours: ?Temp:  [98.6 ?F (37 ?C)-98.7 ?F (37.1 ?C)] 98.6 ?F (41 ?C) (04/27 2118) ?Pulse Rate:  [76-88] 82 (04/28 0831) ?Resp:  [18] 18 (04/28 0831) ?BP: (92-115)/(54-65) 113/63 (04/28 0831) ?SpO2:  [90 %-98 %] 90 % (04/28 0831) ?Last BM Date : 04/07/22 ?General:   Alert,   pleasant and cooperative in NAD.  Does not appear jaundiced. ?Abdomen: Nondistended.  Positive bowel sound soft and nontender without appreciable mass organomegaly.  ? ?Intake/Output from previous day: ?04/27 0701 - 04/28 0700 ?In: 2556.1 [P.O.:810; I.V.:1671.4; IV Piggyback:74.7] ?Out: -  ?Intake/Output this shift: ?No intake/output data recorded. ? ?Lab Results: ?Recent Labs  ?  04/08/22 ?2222 04/09/22 ?0418 04/10/22 ?0458  ?WBC 16.6* 20.3* 13.5*  ?HGB 9.6* 9.2* 8.6*  ?HCT 30.2* 28.9* 27.4*  ?PLT 342 307 294  ? ?BMET ?Recent Labs  ?  04/08/22 ?2222 04/09/22 ?0418 04/10/22 ?0458  ?NA 140 141 139  ?K 2.9* 3.8 3.5  ?CL 106 109 110  ?CO2 '23 23 22  '$ ?GLUCOSE 181* 161* 117*  ?BUN '17 17 21  '$ ?CREATININE 1.33* 1.27* 1.54*  ?CALCIUM 8.2* 8.1* 7.8*  ? ?LFT ?Recent Labs  ?  04/10/22 ?0458  ?PROT 5.6*  ?ALBUMIN 2.3*  ?AST 70*  ?ALT 43  ?ALKPHOS 435*  ?BILITOT 1.7*  ?  ? 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     ? ?Leukocytosis and elevated LFTs-markedly improved over the past 24 hours.   ?Diffuse liver abnormalities on imaging inconclusive as to etiology but may well be major factor contributing to acute illness due to intrahepatic biliary obstruction. ? ?Biliary stent appears to be functioning very well (although cannot rule out transient biliary sludge/biofilm in the lumen of the stent producing self-limiting obstruction. ? ?Patient is not interested in any  aggressive measures including chemotherapy although he states he would like to follow-up with his oncologist Dr. Benay Spice. ? ?A PET scan may be useful to further elucidate etiology of the liver lesions.  However, would hold off on that study until he follows up with his oncologist. ? ? ?Recommendations: ? ?Continue to trend white count, H and H, LFTs; if he continues to progress -  may be able to transition to oral antibiotics tomorrow. ? ?Advance diet today. ? ?I called Dr. Benay Spice.  I was able to reach his nurse Merceda Elks [phone]); updated her with patient's hospitalization.  On behalf of the patient, I requested follow-up appointment with Dr. Benay Spice in the next few weeks. ? ?We will reassess tomorrow morning.  I discussed impression and recommendation at length with the patient and son, Terry Hernandez at the bedside. ? ?

## 2022-04-11 LAB — CBC
HCT: 28.6 % — ABNORMAL LOW (ref 39.0–52.0)
Hemoglobin: 9.3 g/dL — ABNORMAL LOW (ref 13.0–17.0)
MCH: 31.8 pg (ref 26.0–34.0)
MCHC: 32.5 g/dL (ref 30.0–36.0)
MCV: 97.9 fL (ref 80.0–100.0)
Platelets: 356 10*3/uL (ref 150–400)
RBC: 2.92 MIL/uL — ABNORMAL LOW (ref 4.22–5.81)
RDW: 15.5 % (ref 11.5–15.5)
WBC: 12.8 10*3/uL — ABNORMAL HIGH (ref 4.0–10.5)
nRBC: 0 % (ref 0.0–0.2)

## 2022-04-11 LAB — COMPREHENSIVE METABOLIC PANEL
ALT: 39 U/L (ref 0–44)
AST: 52 U/L — ABNORMAL HIGH (ref 15–41)
Albumin: 2.5 g/dL — ABNORMAL LOW (ref 3.5–5.0)
Alkaline Phosphatase: 421 U/L — ABNORMAL HIGH (ref 38–126)
Anion gap: 10 (ref 5–15)
BUN: 19 mg/dL (ref 8–23)
CO2: 20 mmol/L — ABNORMAL LOW (ref 22–32)
Calcium: 7.9 mg/dL — ABNORMAL LOW (ref 8.9–10.3)
Chloride: 111 mmol/L (ref 98–111)
Creatinine, Ser: 1.6 mg/dL — ABNORMAL HIGH (ref 0.61–1.24)
GFR, Estimated: 42 mL/min — ABNORMAL LOW (ref >60)
Glucose, Bld: 153 mg/dL — ABNORMAL HIGH (ref 70–99)
Potassium: 3.4 mmol/L — ABNORMAL LOW (ref 3.5–5.1)
Sodium: 141 mmol/L (ref 135–145)
Total Bilirubin: 1 mg/dL (ref 0.3–1.2)
Total Protein: 6.3 g/dL — ABNORMAL LOW (ref 6.5–8.1)

## 2022-04-11 MED ORDER — AMOXICILLIN-POT CLAVULANATE 875-125 MG PO TABS: 1.0000 | ORAL_TABLET | Freq: Two times a day (BID) | ORAL | 0 refills | Status: AC

## 2022-04-11 NOTE — Progress Notes (Signed)
I discussed with Dr. Julieanne Manson at length last evening.  He agrees with the management plan thus far.  No further imaging here. ?He is out of town next week but is going to arrange to see Mr. Salada back in the office week after next. ? ?This morning, patient tolerating diet.  No abdominal pain.  LFTs are normalizing; bilirubin down to 1.0. ? ? ?Vital signs in last 24 hours: ?Temp:  [98.3 ?F (36.8 ?C)-98.6 ?F (37 ?C)] 98.3 ?F (36.8 ?C) (04/29 0450) ?Pulse Rate:  [92-104] 104 (04/29 0830) ?BP: (125-142)/(73-109) 141/109 (04/29 0830) ?SpO2:  [91 %-95 %] 93 % (04/29 0450) ?Last BM Date : 04/10/22 ?General:   Alert,  pleasant and cooperative in NAD ?Abdomen:  Soft, nontender and nondistended.  Normal bowel sounds, without guarding, and without rebound.  No mass or organomegaly. ?Extremities:  Without clubbing or edema.   ? ?Intake/Output from previous day: ?04/28 0701 - 04/29 0700 ?In: 895.3 [P.O.:720; IV Piggyback:175.3] ?Out: -  ?Intake/Output this shift: ?No intake/output data recorded. ? ?Lab Results: ?Recent Labs  ?  04/09/22 ?0418 04/10/22 ?0458 04/11/22 ?0821  ?WBC 20.3* 13.5* 12.8*  ?HGB 9.2* 8.6* 9.3*  ?HCT 28.9* 27.4* 28.6*  ?PLT 307 294 356  ? ?BMET ?Recent Labs  ?  04/08/22 ?2222 04/09/22 ?0418 04/10/22 ?0458  ?NA 140 141 139  ?K 2.9* 3.8 3.5  ?CL 106 109 110  ?CO2 '23 23 22  '$ ?GLUCOSE 181* 161* 117*  ?BUN '17 17 21  '$ ?CREATININE 1.33* 1.27* 1.54*  ?CALCIUM 8.2* 8.1* 7.8*  ? ?LFT ?Recent Labs  ?  04/10/22 ?0458  ?PROT 5.6*  ?ALBUMIN 2.3*  ?AST 70*  ?ALT 43  ?ALKPHOS 435*  ?BILITOT 1.7*  ? ? ?Impression: 88 year old gentleman with recent cholangitis in the setting of adenocarcinoma of pancreaticobiliary origin.  Usually abnormal liver of uncertain significance. ? ?Cholangitis has resolved clinically.  Obstruction has improved. ? ?As discussed with Dr. Shanon Brow, he is good to go. ? ?Recommendations:  ? ?Agree with home on 10 days of Augmentin ? ?Follow-up with [facility] on May 18.  LFTs just before office visit with  [place].  Discussed impression and recommendations with the son [name], at length at the bedside. ? ?Dr. Learta Codding will be arranging follow-up with him. ?

## 2022-04-11 NOTE — Progress Notes (Signed)
CSW confirmed patient's home health orders are in - [place] will follow up with patient after discharge. ? ?Madilyn Fireman, MSW, LCSW ?Transitions of Care  Clinical Social Worker II ?[phone] ? ?

## 2022-04-11 NOTE — Discharge Summary (Signed)
Physician Discharge Summary  ?Terry Hernandez UKG:[id number] DOB: [birth year] DOA: 04/08/2022 ? ?PCP: [name], FNP ? ?Admit date: 04/08/2022 ?Discharge date: 04/11/2022 ? ?Time spent: 36 minutes ? ?Recommendations for Outpatient Follow-up:  ?Follow-up with primary care physician in 1 to 2 weeks ?Follow-up with Dr. Rondel Oh oncology in 2 weeks ?Follow-up with GI as scheduled ? ? ?Discharge Diagnoses:  ?Principal Problem: ?  Cholangitis ?Active Problems: ?  Hyperbilirubinemia ?  Elevated alkaline phosphatase level ?  Atrial fibrillation ([place]) ?  Generalized weakness ?  Transaminitis ?  Lactic acidosis ?  Hypoalbuminemia due to protein-calorie malnutrition ([place]) ?  Hypokalemia ?  Sepsis ([place]) ? ? ?Discharge Condition: Stable ? ? ?Filed Weights  ? 04/08/22 2212 04/09/22 0259  ?Weight: 69.1 kg 68.9 kg  ? ? ?History of present illness:  ? Terry Hernandez is a 88 y.o. male with medical history significant of with history of GERD, hyperlipidemia, hypertension, biliary adenocarcinoma, and more presents ED with a chief complaint of fever.  Patient is mildly confused at this time.  He initially says he is here because his back was hurting, and then reports that his back does not bother him much.  When asked if he had a fever he said he thinks he has had a long for a couple years.  Son is at bedside and fills in some of the gaps.  He reports that the patient has had an intermittent fever over the last 2 weeks.  Sunday before last the fever was as high as 104.  They saw the PCP that Monday and liver enzymes are slightly elevated.  There was some concern that maybe he had a UTI, but the UA came back with an elevated specific gravity, 3+ protein, few bacteria, casts present, and 0-5 white blood cells.  Patient was taken Tylenol for intermittent fever and it helped.  Today patient was fatigued and cold throughout the day and would not do much at home which is abnormal for him.  This is the reason the son brought him into the ED.   Son reports that back in November and December patient was quite deconditioned, generalized weakness, and requiring home O2.  He got better in January and February started eating more, gained weight, and became his normal active self.  Patient ambulates with a walker with crutches depending on if he is in the house or going out somewhere.  He is normally up and about.  Went son could not get patient to to get up today and he just wanted to lay down so new something was wrong.  Patient denies any abdominal pain, nausea, vomiting, diarrhea.  He does admit to some knee pain which has been going on for quite some time and Tylenol does help.  This is related to his arthritis.  Patient has a history of A-fib with RVR, but has not had any palpitations recently.  Patient does have a history of biliary adenocarcinoma that was diagnosed at the end of 2022.  Patient reports that he saw his first wife go through chemotherapy and he has no interest in ever doing chemo.  Son reports that they told him he was too weak to do chemo and if he wanted to at that time.  Stated palliative biliary stent placed. ?  ?On chart review it looks like that hospitalization started on November 16 where he was diagnosed with biliary adenocarcinoma.  He presented to the hospital at that time for jaundice, weight loss, and melena.  He  had elevated LFTs.  CT of the abdomen pelvis showed a common bile duct stricture.  He had an EGD and ERCP which showed no bleeding source but a common bile duct stricture was found and a stent was placed at that time.  Brush biopsies were taken at that time and came back showing malignant cells consistent with adenocarcinoma.  Patient had a UTI at that time and was ultimately put on a 10-day course of amoxicillin.  Due to obstructive uropathy he was discharged with an indwelling Foley at that time.  Bilirubin and LFTs were improving when he was discharged. ?  ?Today patient has no further complaints.  He reports that he  does not smoke, does not drink alcohol, does not use illicit drugs.  He is vaccinated for COVID.  Patient is DNR. ?  ? ?Hospital Course:  ?Patient was admitted started on IV Zosyn for presumptive cholangitis.  Patient clinically improved over the next 48 hours and remained afebrile.  GI was consulted.  Imaging was done including MRCP which showed common bile duct stent in proper position mild biliary dilation some nonspecific findings that could be cholangitis or possibly early metastatic disease.  Patient's LFTs and white count improved.  He returned back to his baseline status.  He is opted to not have aggressive treatment for his cancer but wishes to have such things as IV fluids and antibiotics if they are able to help.  He is DNR.  Son has been at the bedside daily and given updates.  Patient is being discharged in stable and improved condition.  He is being discharged with PT and home health arranged.  He is good to be discharged to complete course of Augmentin.  Patient instructed that any further fever is not normal any worsening abdominal pain is also not normal or worsening jaundice.  Patient will also follow-up with Dr. Benay Spice his oncologist in 1 to 2 weeks and with GI appointment has been made for may 18th. ? ? ?Discharge Exam: ?Vitals:  ? 04/11/22 0450 04/11/22 0830  ?BP: (!) 142/98 (!) 141/109  ?Pulse: 92 (!) 104  ?Resp:    ?Temp: 98.3 ?F (36.8 ?C)   ?SpO2: 93%   ? ? ?General: Alert oriented no apparent distress ?Cardiovascular: Regular rate rhythm without murmurs rubs or gallops ?Respiratory: Clear to auscultation bilaterally no wheezes rhonchi or rales ? ?Discharge Instructions ? ? ? ?Allergies as of 04/11/2022   ? ?   Reactions  ? Cephalexin Rash  ? REACTION: rash  ? ?  ? ?  ?Medication List  ?  ? ?STOP taking these medications   ? ?amoxicillin 875 MG tablet ?Commonly known as: AMOXIL ?  ? ?  ? ?TAKE these medications   ? ?acetaminophen 325 MG tablet ?Commonly known as: TYLENOL ?Take 650 mg by  mouth every 6 (six) hours as needed for mild pain or fever. ?  ?amoxicillin-clavulanate 875-125 MG tablet ?Commonly known as: Augmentin ?Take 1 tablet by mouth 2 (two) times daily for 7 days. ?  ?feeding supplement Liqd ?Take 237 mLs by mouth 3 (three) times daily between meals. ?  ?methocarbamol 500 MG tablet ?Commonly known as: ROBAXIN ?Take 1 tablet (500 mg total) by mouth every 6 (six) hours as needed for muscle spasms. ?  ?metoprolol succinate 25 MG 24 hr tablet ?Commonly known as: TOPROL-XL ?Take 1.5 tablets (37.5 mg total) by mouth daily. ?  ?mirtazapine 15 MG disintegrating tablet ?Commonly known as: REMERON SOL-TAB ?Take 0.5 tablets (7.5 mg total) by  mouth at bedtime. ?  ?mirtazapine 15 MG tablet ?Commonly known as: REMERON ?TAKE 1/2 TABLET BY MOUTH AT BEDTIME ?  ?multivitamin tablet ?Take 1 tablet by mouth at bedtime. ?  ?pantoprazole 40 MG tablet ?Commonly known as: Protonix ?Take 1 tablet (40 mg total) by mouth daily. ?  ?senna-docusate 8.6-50 MG tablet ?Commonly known as: Senokot-S ?Take 2 tablets by mouth at bedtime. ?What changed:  ?when to take this ?reasons to take this ?  ?silodosin 4 MG Caps capsule ?Commonly known as: RAPAFLO ?TAKE 1 CAPSULE BY MOUTH EVERY MORNING WITH BREAKFAST ?  ?traMADol 50 MG tablet ?Commonly known as: ULTRAM ?Take 50 mg by mouth every 6 (six) hours as needed for moderate pain. ?  ? ?  ? ?Allergies  ?Allergen Reactions  ? Cephalexin Rash  ?  REACTION: rash  ? ? ? ? ?The results of significant diagnostics from this hospitalization (including imaging, microbiology, ancillary and laboratory) are listed below for reference.   ? ?Significant Diagnostic Studies: ?DG Chest 2 View ? ?Result Date: 04/08/2022 ?CLINICAL DATA:  Fever EXAM: CHEST - 2 VIEW COMPARISON:  11/14/2021, CT 11/15/2021, 11/11/2021, 04/16/2020 FINDINGS: Small bilateral pleural effusions. Mild cardiomegaly with diffuse interstitial opacity suspect mild edema on underlying chronic changes. Aortic atherosclerosis. No  pneumothorax IMPRESSION: 1. Cardiomegaly with small bilateral effusions and diffuse increased interstitial opacity, suggestive of mild interstitial edema. Electronically Signed   By: Madie Reno.D.

## 2022-04-11 NOTE — Progress Notes (Signed)
Nursing Discharge Note ? ?Admit Date:  04/08/2022 ?Discharge date: 04/11/2022 ?  ?Terry Hernandez to be D/C'd Home per MD order.  AVS completed. ?Patient/caregiver able to verbalize understanding. ? ?Discharge Medication: ?Allergies as of 04/11/2022   ? ?   Reactions  ? Cephalexin Rash  ? REACTION: rash  ? ?  ? ?  ?Medication List  ?  ? ?STOP taking these medications   ? ?amoxicillin 875 MG tablet ?Commonly known as: AMOXIL ?  ? ?  ? ?TAKE these medications   ? ?acetaminophen 325 MG tablet ?Commonly known as: TYLENOL ?Take 650 mg by mouth every 6 (six) hours as needed for mild pain or fever. ?  ?amoxicillin-clavulanate 875-125 MG tablet ?Commonly known as: Augmentin ?Take 1 tablet by mouth 2 (two) times daily for 7 days. ?  ?feeding supplement Liqd ?Take 237 mLs by mouth 3 (three) times daily between meals. ?  ?methocarbamol 500 MG tablet ?Commonly known as: ROBAXIN ?Take 1 tablet (500 mg total) by mouth every 6 (six) hours as needed for muscle spasms. ?  ?metoprolol succinate 25 MG 24 hr tablet ?Commonly known as: TOPROL-XL ?Take 1.5 tablets (37.5 mg total) by mouth daily. ?  ?mirtazapine 15 MG disintegrating tablet ?Commonly known as: REMERON SOL-TAB ?Take 0.5 tablets (7.5 mg total) by mouth at bedtime. ?  ?mirtazapine 15 MG tablet ?Commonly known as: REMERON ?TAKE 1/2 TABLET BY MOUTH AT BEDTIME ?  ?multivitamin tablet ?Take 1 tablet by mouth at bedtime. ?  ?pantoprazole 40 MG tablet ?Commonly known as: Protonix ?Take 1 tablet (40 mg total) by mouth daily. ?  ?senna-docusate 8.6-50 MG tablet ?Commonly known as: Senokot-S ?Take 2 tablets by mouth at bedtime. ?What changed:  ?when to take this ?reasons to take this ?  ?silodosin 4 MG Caps capsule ?Commonly known as: RAPAFLO ?TAKE 1 CAPSULE BY MOUTH EVERY MORNING WITH BREAKFAST ?  ?traMADol 50 MG tablet ?Commonly known as: ULTRAM ?Take 50 mg by mouth every 6 (six) hours as needed for moderate pain. ?  ? ?  ? ? ?Discharge Assessment: ?Vitals:  ? 04/11/22 0450 04/11/22 0830   ?BP: (!) 142/98 (!) 141/109  ?Pulse: 92 (!) 104  ?Resp:    ?Temp: 98.3 ?F (36.8 ?C)   ?SpO2: 93%   ?Skin clean, dry and intact without evidence of skin break down, no evidence of skin tears noted. ?IV catheter discontinued intact. Site without signs and symptoms of complications - no redness or edema noted at insertion site, patient denies c/o pain - only slight tenderness at site.  Dressing with slight pressure applied. ? ?D/c Instructions-Education: ?Discharge instructions given to patient/family with verbalized understanding. ?D/c education completed with patient/family including follow up instructions, medication list, d/c activities limitations if indicated, with other d/c instructions as indicated by MD - patient able to verbalize understanding, all questions fully answered. ?Patient instructed to return to ED, call 911, or call MD for any changes in condition.  ?Patient escorted via [place], and D/C home via private auto. ? ?Alfonse Alpers, RN ?04/11/2022 10:15 AM   ?

## 2022-04-13 ENCOUNTER — Telehealth: Payer: Self-pay

## 2022-04-13 ENCOUNTER — Telehealth: Payer: Self-pay | Admitting: Family Medicine

## 2022-04-13 ENCOUNTER — Other Ambulatory Visit: Payer: Self-pay

## 2022-04-13 ENCOUNTER — Encounter: Payer: Self-pay | Admitting: *Deleted

## 2022-04-13 DIAGNOSIS — R7989 Other specified abnormal findings of blood chemistry: Secondary | ICD-10-CM

## 2022-04-13 NOTE — Telephone Encounter (Signed)
Terry Hernandez aware that Wednesday is ok.  ?

## 2022-04-13 NOTE — Telephone Encounter (Signed)
Spoke with patient's wife and she is aware to have patient to have labs done at [facility] on May 16th. Labs have been ordered and will be mailed to the patient today.  ?

## 2022-04-13 NOTE — Telephone Encounter (Signed)
-----   Message from Daneil Dolin, MD sent at 04/11/2022 10:41 AM EDT ----- ?Patient being discharged today.  Reportedly has an appointment May 18 with [place]. ? ?Need hepatic function profile 1 to 2 days prior to his office visit.  Thanks. ? ?

## 2022-04-13 NOTE — Progress Notes (Unsigned)
Per Dr. Benay Spice, schedule 30 minute office visit week of 5/8 or 5/15. Scheduling message sent w/request to call son with appointment. ?

## 2022-04-13 NOTE — Telephone Encounter (Signed)
Transition Care Management Follow-up Telephone Call ?Date of discharge and from where: [facility] - 04/11/22 - cholangitis ?How have you been since you were released from the hospital? Doing okay, a little better ?Any questions or concerns? Yes ? ?Items Reviewed: ?Did the pt receive and understand the discharge instructions provided? Yes  ?Medications obtained and verified? Yes  ?Other? No  ?Any new allergies since your discharge? No  ?Dietary orders reviewed? Yes ?Do you have support at home? Yes  ? ?Home Care and Equipment/Supplies: ?Were home health services ordered? yes ?If so, what is the name of the agency? [facility]  ?Has the agency set up a time to come to the patient's home? yes ?Were any new equipment or medical supplies ordered?  No - he already has everything - walker, 3 in 1, bedside commode, shower chair, etc. ? ?Functional Questionnaire: (I = Independent and D = Dependent) ?ADLs: I - with wife's assistance ? ?Bathing/Dressing- I - with wife's assistance ? ?Meal Prep- D ? ?Eating- I ? ?Maintaining continence- I ? ?Transferring/Ambulation- I - with walker ? ?Managing Meds- D ? ?Follow up appointments reviewed: ? ?PCP Hospital f/u appt confirmed?  Wife, [name] is going to call back later this week after she sees how he improves and checks with children/family for transportation - Will try to get appt for next week.   ?[facility] f/u appt confirmed? Yes  Scheduled to see Benay Spice, Oncology on 04/23/22 @ 11:40 and GI 04/30/22 @ 2:30 ?Are transportation arrangements needed?  Yes, but she thinks she can get family to help - will let us know ?If their condition worsens, is the pt aware to call PCP or go to the Emergency Dept.? Yes ?Was the patient provided with contact information for the PCP's office or ED? Yes ?Was to pt encouraged to call back with questions or concerns? Yes  ?

## 2022-04-15 DIAGNOSIS — K8309 Other cholangitis: Secondary | ICD-10-CM | POA: Diagnosis not present

## 2022-04-15 DIAGNOSIS — C249 Malignant neoplasm of biliary tract, unspecified: Secondary | ICD-10-CM | POA: Diagnosis not present

## 2022-04-15 DIAGNOSIS — A419 Sepsis, unspecified organism: Secondary | ICD-10-CM | POA: Diagnosis not present

## 2022-04-15 DIAGNOSIS — I4891 Unspecified atrial fibrillation: Secondary | ICD-10-CM | POA: Diagnosis not present

## 2022-04-15 DIAGNOSIS — N390 Urinary tract infection, site not specified: Secondary | ICD-10-CM | POA: Diagnosis not present

## 2022-04-16 ENCOUNTER — Telehealth: Payer: Self-pay | Admitting: Family Medicine

## 2022-04-16 LAB — CULTURE, BLOOD (SINGLE): Culture: NO GROWTH

## 2022-04-16 NOTE — Telephone Encounter (Signed)
Aware and verbalizes understanding.  

## 2022-04-16 NOTE — Telephone Encounter (Signed)
Pts wife called stating that pt is having diarrhea and wants to know if it could be coming from the antibotic that he is currently taking that was prescribed to him in the hospital? Wants to know if she should stop taking the antibotic? Only has 1 day left to take of it. Wants to know if there is something he can take OTC to help with diarrhea? ? ?Wife also wanted to mention that he had a rash when he came home from the hospital so she believes that could be another sign that he is having a reaction to the antibiotic. But says rash is much better now. ? ? ?

## 2022-04-16 NOTE — Telephone Encounter (Signed)
He should complete the antibiotic as prescribed. Diarrhea and the rash could be a side effect of the antibiotic, if so, this should improved once he completes the last few doses. Make sure he is staying well hydrated. Imodium is available OTC for diarrhea but is not really recommended in elderly patients due to a potential to cause heart arrhthymias or bowel blockage. The important thing with diarrhea is to stay well hydrated. If the rash is itchy, he can try an OTC hydrocortisone cream.  ?

## 2022-04-17 DIAGNOSIS — A419 Sepsis, unspecified organism: Secondary | ICD-10-CM | POA: Diagnosis not present

## 2022-04-17 DIAGNOSIS — K8309 Other cholangitis: Secondary | ICD-10-CM | POA: Diagnosis not present

## 2022-04-17 DIAGNOSIS — N390 Urinary tract infection, site not specified: Secondary | ICD-10-CM | POA: Diagnosis not present

## 2022-04-17 DIAGNOSIS — C249 Malignant neoplasm of biliary tract, unspecified: Secondary | ICD-10-CM | POA: Diagnosis not present

## 2022-04-17 DIAGNOSIS — I4891 Unspecified atrial fibrillation: Secondary | ICD-10-CM | POA: Diagnosis not present

## 2022-04-20 DIAGNOSIS — N390 Urinary tract infection, site not specified: Secondary | ICD-10-CM | POA: Diagnosis not present

## 2022-04-20 DIAGNOSIS — I4891 Unspecified atrial fibrillation: Secondary | ICD-10-CM | POA: Diagnosis not present

## 2022-04-20 DIAGNOSIS — C249 Malignant neoplasm of biliary tract, unspecified: Secondary | ICD-10-CM | POA: Diagnosis not present

## 2022-04-20 DIAGNOSIS — Z515 Encounter for palliative care: Secondary | ICD-10-CM | POA: Diagnosis not present

## 2022-04-20 DIAGNOSIS — K8309 Other cholangitis: Secondary | ICD-10-CM | POA: Diagnosis not present

## 2022-04-20 DIAGNOSIS — A419 Sepsis, unspecified organism: Secondary | ICD-10-CM | POA: Diagnosis not present

## 2022-04-21 ENCOUNTER — Ambulatory Visit (INDEPENDENT_AMBULATORY_CARE_PROVIDER_SITE_OTHER): Payer: Medicare Other | Admitting: Family Medicine

## 2022-04-21 ENCOUNTER — Encounter: Payer: Self-pay | Admitting: Family Medicine

## 2022-04-21 VITALS — BP 138/84 | HR 74 | Temp 98.8°F | Ht 71.0 in | Wt 157.1 lb

## 2022-04-21 DIAGNOSIS — K8309 Other cholangitis: Secondary | ICD-10-CM

## 2022-04-21 DIAGNOSIS — Z09 Encounter for follow-up examination after completed treatment for conditions other than malignant neoplasm: Secondary | ICD-10-CM | POA: Diagnosis not present

## 2022-04-21 DIAGNOSIS — I4891 Unspecified atrial fibrillation: Secondary | ICD-10-CM | POA: Diagnosis not present

## 2022-04-21 DIAGNOSIS — G8929 Other chronic pain: Secondary | ICD-10-CM

## 2022-04-21 DIAGNOSIS — M25562 Pain in left knee: Secondary | ICD-10-CM

## 2022-04-21 DIAGNOSIS — C801 Malignant (primary) neoplasm, unspecified: Secondary | ICD-10-CM

## 2022-04-21 DIAGNOSIS — E876 Hypokalemia: Secondary | ICD-10-CM | POA: Diagnosis not present

## 2022-04-21 NOTE — Progress Notes (Signed)
? ?Established Patient Office Visit ? ?Subjective   ?Patient ID: Terry Hernandez, male    DOB: 1934/12/01  Age: 88 y.o. MRN: [id number] ? ?Chief Complaint  ?Patient presents with  ? Transitions Of Care  ? ? ?HPI ?Here with son today.  ? ?Today's visit was for Transitional Care Management. ? ?The patient was discharged from [facility] on 04/11/22 with a primary diagnosis of cholangitis.  ? ?Contact with the patient and/or caregiver, by a clinical staff member, was made on 04/13/22 and was documented as a telephone encounter within the EMR. ? ?Terry Hernandez was admitted to [facility] on 04/09/22 for cholangitis. He presented with CC of fever. He was admitted and started on IV zosyn for presumptive cholangitis. He improved clinically over the next 48 hours. MRCP was done that showed that his stent was in proper placed with mild biliary dilation and nonspecific finding that could be cholangitis or early metastatic disease. LFTs and WBC improved and he remained afebrile. HH and PT were arranged. He was discharged on augmentin to complete. He has a follow up with oncology next week and a follow up with GI on 5/18.  ? ?He reports that he is feeling better. Has not had any fevers and has completed augmentin. [facility] nurse has been out and plans to come 2-3x a week. PT will be coming out starting next week.  ? ?He has been having some bilateral edema in his feet, ankles, and lower legs. Swelling improves over night. He denies chest pain, shortness of breath, cough, orthopnea, palpitations, focal weakness, nausea, vomiting, or abdominal pain. Denies falls or confusion.   ? ?He has been drinking boost to help with weight gain.  ? ?He has history of OA in his knees. His left knee has been bothering him for weeks now. He has previously had steroid injections and fluids drained from this knee with good relief. He has tried tylenol and voltaren gel without improvement. He would like to see ortho at our office as this is easier for him for transportation.   ? ?Patient Active Problem List  ? Diagnosis Date Noted  ? Cholangitis 04/09/2022  ? Hypokalemia 04/09/2022  ? Sepsis ([place]) 04/09/2022  ? Malnutrition of moderate degree 11/17/2021  ? E Coli Acute lower UTI 11/15/2021  ? Pleural effusion 11/15/2021  ? Physical deconditioning 11/15/2021  ? Transaminitis 11/15/2021  ? Obstructive uropathy 11/15/2021  ? Lactic acidosis 11/15/2021  ? Failure to thrive in adult 11/15/2021  ? Hypoalbuminemia due to protein-calorie malnutrition ([place]) 11/15/2021  ? History of pancreatitis 11/15/2021  ? FTT (failure to thrive) in adult 11/15/2021  ? Generalized weakness 11/14/2021  ? Atrial fibrillation ([place]) 11/04/2021  ? Hyperbilirubinemia 10/24/2021  ? Common bile duct (CBD) stricture 10/24/2021  ? Essential hypertension   ? GERD (gastroesophageal reflux disease)   ? Guaiac positive stools   ? AKI (acute kidney injury) ([place])   ? Leukocytosis   ? Elevated alkaline phosphatase level   ? Macrocytic anemia   ? Hyperglycemia   ? Hyponatremia   ? Hypotension   ? Abnormal weight loss   ? Jaundice   ? Lumbar stenosis with neurogenic claudication 03/18/2020  ? Lumbar spinal stenosis 03/18/2020  ? PREMATURE ATRIAL CONTRACTIONS 09/04/2009  ? PALPITATIONS 09/04/2009  ? Mixed hyperlipidemia 09/03/2009  ? ?  ? ?ROS ?As per HPI.  ?  ?Objective:  ?  ? ?BP 138/84   Pulse 74   Temp 98.8 ?F (37.1 ?C) (Temporal)   Ht _0  (1.803 m)  Wt 157 lb 2 oz (71.3 kg)   BMI 21.91 kg/m?  ?BP Readings from Last 3 Encounters:  ?04/21/22 138/84  ?04/11/22 (!) 141/109  ?03/30/22 121/70  ? ?  ? ?Physical Exam ?Vitals and nursing note reviewed.  ?Constitutional:   ?   General: He is not in acute distress. ?   Appearance: He is not ill-appearing, toxic-appearing or diaphoretic.  ?HENT:  ?   Head: Normocephalic and atraumatic.  ?   Nose: Nose normal.  ?   Mouth/Throat:  ?   Mouth: Mucous membranes are moist.  ?   Pharynx: Oropharynx is clear.  ?Eyes:  ?   General: No scleral icterus. ?Neck:  ?   Vascular: No JVD.   ?Cardiovascular:  ?   Rate and Rhythm: Normal rate. Rhythm irregularly irregular.  ?   Heart sounds: No murmur heard. ?  No friction rub. No gallop. No S3 or S4 sounds.  ?Pulmonary:  ?   Effort: Pulmonary effort is normal. No respiratory distress.  ?   Breath sounds: Normal breath sounds.  ?Abdominal:  ?   General: Bowel sounds are normal. There is no distension.  ?   Palpations: Abdomen is soft.  ?   Tenderness: There is no abdominal tenderness. There is no guarding or rebound.  ?Musculoskeletal:     ?   General: No swelling.  ?   Left knee: Tenderness present over the medial joint line and lateral joint line. Normal patellar mobility.  ?   Right lower leg: 1+ Pitting Edema present.  ?   Left lower leg: 1+ Pitting Edema present.  ?Skin: ?   General: Skin is warm and dry.  ?   Coloration: Skin is not jaundiced.  ?Neurological:  ?   General: No focal deficit present.  ?   Mental Status: He is alert and oriented to person, place, and time.  ?   Motor: Weakness (generalized) present.  ?   Gait: Gait abnormal (crutches).  ?Psychiatric:     ?   Mood and Affect: Mood normal.     ?   Behavior: Behavior normal.  ? ? ? ?No results found for any visits on 04/21/22. ? ?Last CBC ?Lab Results  ?Component Value Date  ? WBC 12.8 (H) 04/11/2022  ? HGB 9.3 (L) 04/11/2022  ? HCT 28.6 (L) 04/11/2022  ? MCV 97.9 04/11/2022  ? MCH 31.8 04/11/2022  ? RDW 15.5 04/11/2022  ? PLT 356 04/11/2022  ? ?Last metabolic panel ?Lab Results  ?Component Value Date  ? GLUCOSE 153 (H) 04/11/2022  ? NA 141 04/11/2022  ? K 3.4 (L) 04/11/2022  ? CL 111 04/11/2022  ? CO2 20 (L) 04/11/2022  ? BUN 19 04/11/2022  ? CREATININE 1.60 (H) 04/11/2022  ? GFRNONAA 42 (L) 04/11/2022  ? CALCIUM 7.9 (L) 04/11/2022  ? PHOS 2.9 11/15/2021  ? PROT 6.3 (L) 04/11/2022  ? ALBUMIN 2.5 (L) 04/11/2022  ? LABGLOB 3.3 03/30/2022  ? AGRATIO 1.0 (L) 03/30/2022  ? BILITOT 1.0 04/11/2022  ? ALKPHOS 421 (H) 04/11/2022  ? AST 52 (H) 04/11/2022  ? ALT 39 04/11/2022  ? ANIONGAP 10  04/11/2022  ? ?Last lipids ?Lab Results  ?Component Value Date  ? CHOL 285 (H) 10/25/2021  ? HDL <10 (L) 10/25/2021  ? [place] NOT CALCULATED 10/25/2021  ? TRIG 124 10/25/2021  ? CHOLHDL NOT CALCULATED 10/25/2021  ? ?  ? ?The ASCVD Risk score (Arnett DK, et al., 2019) failed to calculate for the following reasons: ?  The 2019 ASCVD risk score is only valid for ages 42 to 76 ? ?  ?Assessment & Plan:  ? ?Nivan was seen today for transitions of care. ? ?Diagnoses and all orders for this visit: ? ?Cholangitis ?IV abx and complete outpatient course of augmentin. Labs pending. F/u appt with with GI 5/18. Has been afebrile.  ?-     CBC with Differential/Platelet ?-     CMP14+EGFR ? ?Hyperbilirubinemia ?Labs pending. Has appt with GI on 5/18 ?-     CMP14+EGFR ? ?Atrial fibrillation, unspecified type ([place]) ?Denies symptoms. Labs pending.  ?-     CBC with Differential/Platelet ?-     CMP14+EGFR ? ?Hypokalemia ?Labs pending.  ?-     CMP14+EGFR ? ?Adenocarcinoma ([place]) ?Has upcoming appt with oncology. ? ?Chronic pain of left knee ?Referral placed to ortho at [facility] ?-     Ambulatory referral to Orthopedic Surgery ? ?Hospital discharge follow-up ?Records reviewed.  ? ? ?Return in about 3 months (around 07/22/2022) for chronic follow up, schedule with ortho at [facility] for knee injection.  ? ? ?[name], FNP ? ?

## 2022-04-21 NOTE — Patient Instructions (Signed)
Peripheral Edema  Peripheral edema is swelling that is caused by a buildup of fluid. Peripheral edema most often affects the lower legs, ankles, and feet. It can also develop in the arms, hands, and face. The area of the body that has peripheral edema will look swollen. It may also feel heavy or warm. Your clothes may start to feel tight. Pressing on the area may make a temporary dent in your skin (pitting edema). You may not be able to move your swollen arm or leg as much as usual. There are many causes of peripheral edema. It can happen because of a complication of other conditions such as heart failure, kidney disease, or a problem with your circulation. It also can be a side effect of certain medicines or happen because of an infection. It often happens to women during pregnancy. Sometimes, the cause is not known. Follow these instructions at home: Managing pain, stiffness, and swelling  Raise (elevate) your legs while you are sitting or lying down. Move around often to prevent stiffness and to reduce swelling. Do not sit or stand for long periods of time. Do not wear tight clothing. Do not wear garters on your upper legs. Exercise your legs to get your circulation going. This helps to move the fluid back into your blood vessels, and it may help the swelling go down. Wear compression stockings as told by your health care provider. These stockings help to prevent blood clots and reduce swelling in your legs. It is important that these are the correct size. These stockings should be prescribed by your doctor to prevent possible injuries. If elastic bandages or wraps are recommended, use them as told by your health care provider. Medicines Take over-the-counter and prescription medicines only as told by your health care provider. Your health care provider may prescribe medicine to help your body get rid of excess water (diuretic). Take this medicine if you are told to take it. General  instructions Eat a low-salt (low-sodium) diet as told by your health care provider. Sometimes, eating less salt may reduce swelling. Pay attention to any changes in your symptoms. Moisturize your skin daily to help prevent skin from cracking and draining. Keep all follow-up visits. This is important. Contact a health care provider if: You have a fever. You have swelling in only one leg. You have increased swelling, redness, or pain in one or both of your legs. You have drainage or sores at the area where you have edema. Get help right away if: You have edema that starts suddenly or is getting worse, especially if you are pregnant or have a medical condition. You develop shortness of breath, especially when you are lying down. You have pain in your chest or abdomen. You feel weak. You feel like you will faint. These symptoms may be an emergency. Get help right away. Call 911. Do not wait to see if the symptoms will go away. Do not drive yourself to the hospital. Summary Peripheral edema is swelling that is caused by a buildup of fluid. Peripheral edema most often affects the lower legs, ankles, and feet. Move around often to prevent stiffness and to reduce swelling. Do not sit or stand for long periods of time. Pay attention to any changes in your symptoms. Contact a health care provider if you have edema that starts suddenly or is getting worse, especially if you are pregnant or have a medical condition. Get help right away if you develop shortness of breath, especially when lying down.   This information is not intended to replace advice given to you by your health care provider. Make sure you discuss any questions you have with your health care provider. Document Revised: 08/04/2021 Document Reviewed: 08/04/2021 Elsevier Patient Education  2023 Elsevier Inc.  

## 2022-04-22 ENCOUNTER — Other Ambulatory Visit: Payer: Self-pay | Admitting: Family Medicine

## 2022-04-22 DIAGNOSIS — I4891 Unspecified atrial fibrillation: Secondary | ICD-10-CM | POA: Diagnosis not present

## 2022-04-22 DIAGNOSIS — K8309 Other cholangitis: Secondary | ICD-10-CM

## 2022-04-22 DIAGNOSIS — N390 Urinary tract infection, site not specified: Secondary | ICD-10-CM | POA: Diagnosis not present

## 2022-04-22 DIAGNOSIS — C249 Malignant neoplasm of biliary tract, unspecified: Secondary | ICD-10-CM | POA: Diagnosis not present

## 2022-04-22 DIAGNOSIS — A419 Sepsis, unspecified organism: Secondary | ICD-10-CM | POA: Diagnosis not present

## 2022-04-22 LAB — CBC WITH DIFFERENTIAL/PLATELET
Basophils Absolute: 0.2 10*3/uL (ref 0.0–0.2)
Basos: 1 %
EOS (ABSOLUTE): 2.3 10*3/uL — ABNORMAL HIGH (ref 0.0–0.4)
Eos: 17 %
Hematocrit: 31.6 % — ABNORMAL LOW (ref 37.5–51.0)
Hemoglobin: 10.2 g/dL — ABNORMAL LOW (ref 13.0–17.7)
Immature Grans (Abs): 0 10*3/uL (ref 0.0–0.1)
Immature Granulocytes: 0 %
Lymphocytes Absolute: 3.9 10*3/uL — ABNORMAL HIGH (ref 0.7–3.1)
Lymphs: 28 %
MCH: 31.7 pg (ref 26.6–33.0)
MCHC: 32.3 g/dL (ref 31.5–35.7)
MCV: 98 fL — ABNORMAL HIGH (ref 79–97)
Monocytes Absolute: 0.9 10*3/uL (ref 0.1–0.9)
Monocytes: 7 %
Neutrophils Absolute: 6.7 10*3/uL (ref 1.4–7.0)
Neutrophils: 47 %
Platelets: 323 10*3/uL (ref 150–450)
RBC: 3.22 x10E6/uL — ABNORMAL LOW (ref 4.14–5.80)
RDW: 12.8 % (ref 11.6–15.4)
WBC: 14.1 10*3/uL — ABNORMAL HIGH (ref 3.4–10.8)

## 2022-04-22 LAB — CMP14+EGFR
ALT: 30 IU/L (ref 0–44)
AST: 52 IU/L — ABNORMAL HIGH (ref 0–40)
Albumin/Globulin Ratio: 0.9 — ABNORMAL LOW (ref 1.2–2.2)
Albumin: 3.3 g/dL — ABNORMAL LOW (ref 3.6–4.6)
Alkaline Phosphatase: 463 IU/L — ABNORMAL HIGH (ref 44–121)
BUN/Creatinine Ratio: 11 (ref 10–24)
BUN: 15 mg/dL (ref 8–27)
Bilirubin Total: 0.7 mg/dL (ref 0.0–1.2)
CO2: 23 mmol/L (ref 20–29)
Calcium: 8.6 mg/dL (ref 8.6–10.2)
Chloride: 106 mmol/L (ref 96–106)
Creatinine, Ser: 1.35 mg/dL — ABNORMAL HIGH (ref 0.76–1.27)
Globulin, Total: 3.8 g/dL (ref 1.5–4.5)
Glucose: 139 mg/dL — ABNORMAL HIGH (ref 70–99)
Potassium: 4.3 mmol/L (ref 3.5–5.2)
Sodium: 142 mmol/L (ref 134–144)
Total Protein: 7.1 g/dL (ref 6.0–8.5)
eGFR: 51 mL/min/{1.73_m2} — ABNORMAL LOW (ref >59)

## 2022-04-22 MED ORDER — CIPROFLOXACIN HCL 500 MG PO TABS: 500.0000 mg | ORAL_TABLET | Freq: Two times a day (BID) | ORAL | 0 refills | Status: DC

## 2022-04-23 ENCOUNTER — Inpatient Hospital Stay: Payer: Medicare Other | Attending: Oncology | Admitting: Oncology

## 2022-04-23 DIAGNOSIS — K831 Obstruction of bile duct: Secondary | ICD-10-CM

## 2022-04-23 DIAGNOSIS — J90 Pleural effusion, not elsewhere classified: Secondary | ICD-10-CM | POA: Diagnosis not present

## 2022-04-23 DIAGNOSIS — C221 Intrahepatic bile duct carcinoma: Secondary | ICD-10-CM | POA: Insufficient documentation

## 2022-04-23 NOTE — Progress Notes (Signed)
?Terry Hernandez ?OFFICE PROGRESS NOTE ? ? ?Diagnosis: Cholangiocarcinoma ? ?INTERVAL HISTORY:  ? ?Terry Hernandez was seen at the cancer center in November 2022 when he was diagnosed with cholangiocarcinoma.  He did not return for scheduled follow-up. ?He reports feeling well.  He developed a high fever in April.  He presented to the emergency room 04/08/2022.  The liver enzymes were elevated.  He was started on Zosyn and had clinical improvement.  He was discharged to home 04/11/2022 to complete a course of Augmentin. ? ?He feels well at present.  Good appetite. ? ?A CT abdomen/pelvis 04/09/2022 revealed placement of a biliary stent with improvement in biliary dilatation with new nodular enhancement throughout the liver suspicious for cholangitis.  Bilateral pleural effusions with bibasilar atelectasis. ? ?An MRI 04/09/2022 revealed innumerable foci of arterial enhancement in the liver consistent with cholangitis, vascular shunting, or early metastatic disease.  Mild biliary dilatation with enhancement in the pancreas head at the anterior margin of the stent. ? ? ?He had repeat liver enzymes via his primary provider earlier this week. ?Objective: ? ?Vital signs in last 24 hours: ? ?There were no vitals taken for this visit. ?  ? ?HEENT: Sclera anicteric ?Resp: Distant breath sounds, scattered inspiratory rhonchi at the posterior chest bilaterally, no respiratory distress ?Cardio: Regular rhythm with premature beats ?GI: No hepatosplenomegaly, no mass, nontender ?Vascular: Trace pitting edema at the lower leg bilaterally ? ? ?Lab Results: ? ?Lab Results  ?Component Value Date  ? WBC 14.1 (H) 04/21/2022  ? HGB 10.2 (L) 04/21/2022  ? HCT 31.6 (L) 04/21/2022  ? MCV 98 (H) 04/21/2022  ? PLT 323 04/21/2022  ? NEUTROABS 6.7 04/21/2022  ? ? ?CMP  ?Lab Results  ?Component Value Date  ? NA 142 04/21/2022  ? K 4.3 04/21/2022  ? CL 106 04/21/2022  ? CO2 23 04/21/2022  ? GLUCOSE 139 (H) 04/21/2022  ? BUN 15 04/21/2022  ?  CREATININE 1.35 (H) 04/21/2022  ? CALCIUM 8.6 04/21/2022  ? PROT 7.1 04/21/2022  ? ALBUMIN 3.3 (L) 04/21/2022  ? AST 52 (H) 04/21/2022  ? ALT 30 04/21/2022  ? ALKPHOS 463 (H) 04/21/2022  ? BILITOT 0.7 04/21/2022  ? GFRNONAA 42 (L) 04/11/2022  ? GFRAA >60 03/18/2020  ? ? ?Medications: I have reviewed the patient's current medications. ? ? ?Assessment/Plan: ?Bile duct stricture-cytology from brushing 10/26/2021-malignant cells consistent with adenocarcinoma ?ERCP 10/26/2021-distal common duct stricture confirmed, plastic stent placed ?Repeat ERCP 10/30/2021-placement of uncovered metal stent ?CT abdomen/pelvis 10/23/2021-intra and extrahepatic biliary dilatation, tapering at the ampulla, no mass ?04/09/2022-MRI/MRCP-innumerable foci of arterial phase enhancement in the liver, mild biliary dilatation with a common bile duct stent in place, faintly accentuated enhancement in the pancreas head at the anterior margin of the stent, borderline enlarged porta hepatis node ?CT abdomen/pelvis 04/09/2022-improved biliary dilatation, interval placement of a biliary stent, areas of nodular enhancement peripherally in the liver, ?Severe macrocytic anemia 10/25/2021-no source for bleeding identified on upper endoscopy 10/26/2021 ?1 unit packed red blood cells 10/26/2021 ?3.   Obstructive nephropathy-placement of Foley catheter 10/27/2021 ?Followed by urology for BPH ? ?4.  Obstructive jaundice secondary to #1-improved ?5.  Spinal stenosis-status post lumbar laminectomy April 2021 ?6.  Arrhythmia-atrial fibrillation? ?7.  Admission 04/08/2022 with a fever/cholangitis ? ? ? ?Disposition: ?Terry Hernandez has a history of cholangiocarcinoma.  He underwent placement of a palliative common duct stent in November 2022.  The hospital admission last month appears consistent with cholangitis.  The liver enzymes remain improved today.  He knows to call for a fever or symptoms of an infection.  He will return for a liver panel in 1 month.  He will  be scheduled for a 9-monthoffice visit. ?I suspect the enhancing liver lesions noted on the CT abdomen and MRI are related to cholangitis as opposed to metastatic cholangiocarcinoma.  He does not appear to be a chemotherapy candidate.  I will present his case at the GI tumor conference within the next few weeks.  I reviewed the CT and MRI images with Mr. HGraysonand his son.  I will recommend a follow-up with Dr. MWatt Climesto consider placement of a new stent if the liver enzymes increase. ? ?I discussed the case with Dr. RGala Romneyby telephone while Terry Hernandez admitted last month. ? ?GBetsy Coder MD ? ?04/23/2022  ?1:19 PM ? ? ?

## 2022-04-24 DIAGNOSIS — I4891 Unspecified atrial fibrillation: Secondary | ICD-10-CM | POA: Diagnosis not present

## 2022-04-24 DIAGNOSIS — A419 Sepsis, unspecified organism: Secondary | ICD-10-CM | POA: Diagnosis not present

## 2022-04-24 DIAGNOSIS — C249 Malignant neoplasm of biliary tract, unspecified: Secondary | ICD-10-CM | POA: Diagnosis not present

## 2022-04-24 DIAGNOSIS — N390 Urinary tract infection, site not specified: Secondary | ICD-10-CM | POA: Diagnosis not present

## 2022-04-24 DIAGNOSIS — K8309 Other cholangitis: Secondary | ICD-10-CM | POA: Diagnosis not present

## 2022-04-27 DIAGNOSIS — K8309 Other cholangitis: Secondary | ICD-10-CM | POA: Diagnosis not present

## 2022-04-27 DIAGNOSIS — I4891 Unspecified atrial fibrillation: Secondary | ICD-10-CM | POA: Diagnosis not present

## 2022-04-27 DIAGNOSIS — A419 Sepsis, unspecified organism: Secondary | ICD-10-CM | POA: Diagnosis not present

## 2022-04-27 DIAGNOSIS — N390 Urinary tract infection, site not specified: Secondary | ICD-10-CM | POA: Diagnosis not present

## 2022-04-27 DIAGNOSIS — C249 Malignant neoplasm of biliary tract, unspecified: Secondary | ICD-10-CM | POA: Diagnosis not present

## 2022-04-28 DIAGNOSIS — N390 Urinary tract infection, site not specified: Secondary | ICD-10-CM | POA: Diagnosis not present

## 2022-04-28 DIAGNOSIS — K8309 Other cholangitis: Secondary | ICD-10-CM | POA: Diagnosis not present

## 2022-04-28 DIAGNOSIS — C249 Malignant neoplasm of biliary tract, unspecified: Secondary | ICD-10-CM | POA: Diagnosis not present

## 2022-04-28 DIAGNOSIS — I4891 Unspecified atrial fibrillation: Secondary | ICD-10-CM | POA: Diagnosis not present

## 2022-04-28 DIAGNOSIS — A419 Sepsis, unspecified organism: Secondary | ICD-10-CM | POA: Diagnosis not present

## 2022-04-29 ENCOUNTER — Other Ambulatory Visit: Payer: Self-pay

## 2022-04-29 DIAGNOSIS — A419 Sepsis, unspecified organism: Secondary | ICD-10-CM | POA: Diagnosis not present

## 2022-04-29 DIAGNOSIS — C249 Malignant neoplasm of biliary tract, unspecified: Secondary | ICD-10-CM | POA: Diagnosis not present

## 2022-04-29 DIAGNOSIS — I4891 Unspecified atrial fibrillation: Secondary | ICD-10-CM | POA: Diagnosis not present

## 2022-04-29 DIAGNOSIS — N390 Urinary tract infection, site not specified: Secondary | ICD-10-CM | POA: Diagnosis not present

## 2022-04-29 DIAGNOSIS — K8309 Other cholangitis: Secondary | ICD-10-CM | POA: Diagnosis not present

## 2022-04-29 NOTE — Progress Notes (Signed)
The proposed treatment discussed in conference is for discussion purpose only and is not a binding recommendation.  The patients have not been physically examined, or presented with their treatment options.  Therefore, final treatment plans cannot be decided.  

## 2022-04-30 ENCOUNTER — Ambulatory Visit: Payer: Medicare Other | Admitting: Gastroenterology

## 2022-04-30 ENCOUNTER — Encounter: Payer: Self-pay | Admitting: Internal Medicine

## 2022-04-30 ENCOUNTER — Ambulatory Visit (INDEPENDENT_AMBULATORY_CARE_PROVIDER_SITE_OTHER): Payer: Medicare Other | Admitting: Internal Medicine

## 2022-04-30 VITALS — BP 120/62 | HR 70 | Temp 97.8°F | Ht 70.0 in | Wt 157.2 lb

## 2022-04-30 DIAGNOSIS — K831 Obstruction of bile duct: Secondary | ICD-10-CM | POA: Diagnosis not present

## 2022-04-30 DIAGNOSIS — K8309 Other cholangitis: Secondary | ICD-10-CM

## 2022-04-30 DIAGNOSIS — C221 Intrahepatic bile duct carcinoma: Secondary | ICD-10-CM | POA: Diagnosis not present

## 2022-04-30 DIAGNOSIS — R634 Abnormal weight loss: Secondary | ICD-10-CM

## 2022-04-30 NOTE — Progress Notes (Signed)
Referring Provider: [name], FNP Primary Care Physician:  [name], FNP Primary GI:  Dr. Abbey Chatters  Chief Complaint  Patient presents with   New Patient (Initial Visit)    Elevated liver enzymes    HPI:   Terry Hernandez is a 88 y.o. male who presents to clinic today for hospital follow-up visit.  Has a history of biliary stricture due to pancreatic adenocarcinoma or cholangiocarcinoma, cholangitis status post metal stent.  Interval history below:  ERCP 10/26/2021 due tor obstructive jaundice, abnormal pancreas on prior CT-distal common duct stricture confirmed, plastic stent placed  Repeat ERCP 10/30/2021-placement of uncovered metal stent  CT abdomen/pelvis 10/23/2021-intra and extrahepatic biliary dilatation, tapering at the ampulla, no mass  04/09/2022-MRI/MRCP-innumerable foci of arterial phase enhancement in the liver, mild biliary dilatation with a common bile duct stent in place, faintly accentuated enhancement in the pancreas head at the anterior margin of the stent, borderline enlarged porta hepatis node  CT abdomen/pelvis 04/09/2022-improved biliary dilatation, interval placement of a biliary stent, areas of nodular enhancement peripherally in the liver,  Recent admission to [facility], discharged 04/11/2022 after being treated with IV Zosyn for presumptive cholangitis.  Discharged home on full course of Augmentin.  Today patient is accompanied by his son.  Patient states he feels great.  Denies any abdominal pain.  No nausea or vomiting.  No jaundice.  T. bili has normalized.  Notes 20 pound weight gain since January.  Eating well.  Following Dr. Benay Spice of oncology.  Past Medical History:  Diagnosis Date   Arthritis    GERD (gastroesophageal reflux disease)    PMH   Glucose intolerance (impaired glucose tolerance)    High cholesterol    History of kidney stones    HTN (hypertension)    Hyperlipidemia    Lumbar stenosis with neurogenic  claudication    Pneumonia    Subcutaneous mass    Wears glasses     Past Surgical History:  Procedure Laterality Date   APPENDECTOMY     BILIARY BRUSHING  10/26/2021   Procedure: BILIARY BRUSHING;  Surgeon: Clarene Essex, MD;  Location: [facility] ENDOSCOPY;  Service: Endoscopy;;   BILIARY STENT PLACEMENT  10/26/2021   Procedure: BILIARY STENT PLACEMENT;  Surgeon: Clarene Essex, MD;  Location: [facility] ENDOSCOPY;  Service: Endoscopy;;   BILIARY STENT PLACEMENT  10/30/2021   Procedure: BILIARY STENT PLACEMENT;  Surgeon: Clarene Essex, MD;  Location: [place];  Service: Endoscopy;;   COLONOSCOPY W/ BIOPSIES AND POLYPECTOMY     ERCP N/A 10/26/2021   Procedure: ENDOSCOPIC RETROGRADE CHOLANGIOPANCREATOGRAPHY (ERCP);  Surgeon: Clarene Essex, MD;  Location: [place];  Service: Endoscopy;  Laterality: N/A;   ERCP N/A 10/30/2021   Procedure: ENDOSCOPIC RETROGRADE CHOLANGIOPANCREATOGRAPHY (ERCP);  Surgeon: Clarene Essex, MD;  Location: [place];  Service: Endoscopy;  Laterality: N/A;   ESOPHAGOGASTRODUODENOSCOPY N/A 10/26/2021   Procedure: ESOPHAGOGASTRODUODENOSCOPY (EGD);  Surgeon: Clarene Essex, MD;  Location: [place];  Service: Endoscopy;  Laterality: N/A;   LUMBAR LAMINECTOMY/DECOMPRESSION MICRODISCECTOMY Bilateral 03/18/2020   Procedure: Bilateral Lumbar three-four Lumbar four-five Laminotomy/foraminotomy;  Surgeon: Kristeen Miss, MD;  Location: [place];  Service: Neurosurgery;  Laterality: Bilateral;   MASS EXCISION Right 03/18/2020   Procedure: Excision of a Right gluteal subcutaneous mass;  Surgeon: Kristeen Miss, MD;  Location: [place];  Service: Neurosurgery;  Laterality: Right;   MULTIPLE TOOTH EXTRACTIONS     SPHINCTEROTOMY  10/26/2021   Procedure: SPHINCTEROTOMY;  Surgeon: Clarene Essex, MD;  Location: [place];  Service: Endoscopy;;  STENT REMOVAL  10/30/2021   Procedure: STENT REMOVAL;  Surgeon: Clarene Essex, MD;  Location: [facility] ENDOSCOPY;  Service: Endoscopy;;    Current Outpatient Medications   Medication Sig Dispense Refill   acetaminophen (TYLENOL) 325 MG tablet Take 650 mg by mouth every 6 (six) hours as needed for mild pain or fever.     feeding supplement (ENSURE ENLIVE / ENSURE PLUS) LIQD Take 237 mLs by mouth 3 (three) times daily between meals. 237 mL 12   ferrous sulfate 325 (65 FE) MG tablet Take 325 mg by mouth daily with breakfast.     methocarbamol (ROBAXIN) 500 MG tablet Take 1 tablet (500 mg total) by mouth every 6 (six) hours as needed for muscle spasms. 30 tablet 3   metoprolol succinate (TOPROL-XL) 25 MG 24 hr tablet Take 1.5 tablets (37.5 mg total) by mouth daily. 90 tablet 5   mirtazapine (REMERON) 15 MG tablet TAKE 1/2 TABLET BY MOUTH AT BEDTIME 30 tablet 3   Multiple Vitamin (MULTIVITAMIN) tablet Take 1 tablet by mouth at bedtime.      senna-docusate (SENOKOT-S) 8.6-50 MG tablet Take 2 tablets by mouth at bedtime. (Patient taking differently: Take 2 tablets by mouth at bedtime as needed for mild constipation.) 60 tablet 1   silodosin (RAPAFLO) 4 MG CAPS capsule TAKE 1 CAPSULE BY MOUTH EVERY MORNING WITH BREAKFAST 30 capsule 11   traMADol (ULTRAM) 50 MG tablet Take 50 mg by mouth every 6 (six) hours as needed for moderate pain.     pantoprazole (PROTONIX) 40 MG tablet Take 1 tablet (40 mg total) by mouth daily. 30 tablet 0   No current facility-administered medications for this visit.    Allergies as of 04/30/2022 - Review Complete 04/30/2022  Allergen Reaction Noted   Cephalexin Rash     Family History  Problem Relation Age of Onset   Cancer Mother    Stroke Father    Cancer Son        lung    Social History   Socioeconomic History   Marital status: Married    Spouse name: Not on file   Number of children: 2   Years of education: Not on file   Highest education level: Not on file  Occupational History   Not on file  Tobacco Use   Smoking status: Never   Smokeless tobacco: Never  Vaping Use   Vaping Use: Never used  Substance and Sexual  Activity   Alcohol use: Never   Drug use: Never   Sexual activity: Not Currently  Other Topics Concern   Not on file  Social History Narrative   Not on file   Social Determinants of Health   Financial Resource Strain: Not on file  Food Insecurity: Not on file  Transportation Needs: Not on file  Physical Activity: Not on file  Stress: Not on file  Social Connections: Not on file    Subjective: Review of Systems  Constitutional:  Negative for chills and fever.  HENT:  Negative for congestion and hearing loss.   Eyes:  Negative for blurred vision and double vision.  Respiratory:  Negative for cough and shortness of breath.   Cardiovascular:  Negative for chest pain and palpitations.  Gastrointestinal:  Negative for abdominal pain, blood in stool, constipation, diarrhea, heartburn, melena and vomiting.  Genitourinary:  Negative for dysuria and urgency.  Musculoskeletal:  Negative for joint pain and myalgias.  Skin:  Negative for itching and rash.  Neurological:  Negative for dizziness and headaches.  Psychiatric/Behavioral:  Negative for depression. The patient is not nervous/anxious.     Objective: BP 120/62   Pulse 70   Temp 97.8 F (36.6 C)   Ht '5\' 10"'$  (1.778 m)   Wt 157 lb 3.2 oz (71.3 kg)   BMI 22.56 kg/m  Physical Exam Constitutional:      Appearance: Normal appearance.  HENT:     Head: Normocephalic and atraumatic.  Eyes:     Extraocular Movements: Extraocular movements intact.     Conjunctiva/sclera: Conjunctivae normal.  Cardiovascular:     Rate and Rhythm: Normal rate and regular rhythm.  Pulmonary:     Effort: Pulmonary effort is normal.     Breath sounds: Normal breath sounds.  Abdominal:     General: Bowel sounds are normal.     Palpations: Abdomen is soft.  Musculoskeletal:        General: Normal range of motion.     Cervical back: Normal range of motion and neck supple.  Skin:    General: Skin is warm.  Neurological:     General: No focal  deficit present.     Mental Status: He is alert and oriented to person, place, and time.  Psychiatric:        Mood and Affect: Mood normal.        Behavior: Behavior normal.     Assessment: *Biliary stricture- malignant due to cholangiocarcinoma or pancreatic adenocarcinoma *Abnormal LFTs *Weight loss-improved *Liver lesions-likely due to cholangitis  Plan: Overall, patient appears to be doing better today.  Notes good appetite.  No jaundice.  No abdominal pain.  No fever.  He will have repeat CBC CMP in a few weeks time.  Hopefully his LFTs continue to trend in the right direction.  If worsening, may need to consider biliary stent replacement.  This will need to be done in [place].  Patient develops fever, abdominal pain, jaundice, nausea vomiting, recommend ER evaluation for possible cholangitis.  Congratulated patient on his weight gain.  We will continue to monitor.  Continue follow-up with Dr. Benay Spice of oncology.  Agree with palliative involvement.  Follow-up in 3 months or sooner if needed.  04/30/2022 3:22 PM   Disclaimer: This note was dictated with voice recognition software. Similar sounding words can inadvertently be transcribed and may not be corrected upon review.

## 2022-04-30 NOTE — Patient Instructions (Signed)
I will order blood work to be performed at [facility] in a few weeks.  Hopefully your liver tests continue to trend in the right direction.  If they worsen, we may need to refer you back down to [facility] for stent replacement.  Continue to work on weight gain.  Agree with palliative care involvement.  If you have further fevers, abdominal pain, nausea or vomiting, jaundice, would recommend ER evaluation.  Otherwise follow-up in 2 to 3 months.  It was very nice meeting both you today.  Dr. Abbey Chatters  At [facility] we value your feedback. You may receive a survey about your visit today. Please share your experience as we strive to create trusting relationships with our patients to provide genuine, compassionate, quality care.  We appreciate your understanding and patience as we review any laboratory studies, imaging, and other diagnostic tests that are ordered as we care for you. Our office policy is 5 business days for review of these results, and any emergent or urgent results are addressed in a timely manner for your best interest. If you do not hear from our office in 1 week, please contact Terry Hernandez.   We also encourage the use of MyChart, which contains your medical information for your review as well. If you are not enrolled in this feature, an access code is on this after visit summary for your convenience. Thank you for allowing Terry Hernandez to be involved in your care.  It was great to see you today!  I hope you have a great rest of your Spring!    Elon Alas. Abbey Chatters, D.O. Gastroenterology and Hepatology [facility]

## 2022-05-01 DIAGNOSIS — K8309 Other cholangitis: Secondary | ICD-10-CM | POA: Diagnosis not present

## 2022-05-01 DIAGNOSIS — N390 Urinary tract infection, site not specified: Secondary | ICD-10-CM | POA: Diagnosis not present

## 2022-05-01 DIAGNOSIS — C249 Malignant neoplasm of biliary tract, unspecified: Secondary | ICD-10-CM | POA: Diagnosis not present

## 2022-05-01 DIAGNOSIS — I4891 Unspecified atrial fibrillation: Secondary | ICD-10-CM | POA: Diagnosis not present

## 2022-05-01 DIAGNOSIS — A419 Sepsis, unspecified organism: Secondary | ICD-10-CM | POA: Diagnosis not present

## 2022-05-04 ENCOUNTER — Telehealth: Payer: Self-pay | Admitting: Family Medicine

## 2022-05-04 DIAGNOSIS — N390 Urinary tract infection, site not specified: Secondary | ICD-10-CM | POA: Diagnosis not present

## 2022-05-04 DIAGNOSIS — A419 Sepsis, unspecified organism: Secondary | ICD-10-CM | POA: Diagnosis not present

## 2022-05-04 DIAGNOSIS — I4891 Unspecified atrial fibrillation: Secondary | ICD-10-CM | POA: Diagnosis not present

## 2022-05-04 DIAGNOSIS — C249 Malignant neoplasm of biliary tract, unspecified: Secondary | ICD-10-CM | POA: Diagnosis not present

## 2022-05-04 DIAGNOSIS — K8309 Other cholangitis: Secondary | ICD-10-CM | POA: Diagnosis not present

## 2022-05-05 DIAGNOSIS — C249 Malignant neoplasm of biliary tract, unspecified: Secondary | ICD-10-CM | POA: Diagnosis not present

## 2022-05-05 DIAGNOSIS — N390 Urinary tract infection, site not specified: Secondary | ICD-10-CM | POA: Diagnosis not present

## 2022-05-05 DIAGNOSIS — I4891 Unspecified atrial fibrillation: Secondary | ICD-10-CM | POA: Diagnosis not present

## 2022-05-05 DIAGNOSIS — K8309 Other cholangitis: Secondary | ICD-10-CM | POA: Diagnosis not present

## 2022-05-05 DIAGNOSIS — A419 Sepsis, unspecified organism: Secondary | ICD-10-CM | POA: Diagnosis not present

## 2022-05-05 NOTE — Telephone Encounter (Signed)
Forwarding FYI call from Lea called from [facility]. Spouse is declining OT at this time.

## 2022-05-06 DIAGNOSIS — N390 Urinary tract infection, site not specified: Secondary | ICD-10-CM | POA: Diagnosis not present

## 2022-05-06 DIAGNOSIS — C249 Malignant neoplasm of biliary tract, unspecified: Secondary | ICD-10-CM | POA: Diagnosis not present

## 2022-05-06 DIAGNOSIS — A419 Sepsis, unspecified organism: Secondary | ICD-10-CM | POA: Diagnosis not present

## 2022-05-06 DIAGNOSIS — K8309 Other cholangitis: Secondary | ICD-10-CM | POA: Diagnosis not present

## 2022-05-06 DIAGNOSIS — I4891 Unspecified atrial fibrillation: Secondary | ICD-10-CM | POA: Diagnosis not present

## 2022-05-13 DIAGNOSIS — N390 Urinary tract infection, site not specified: Secondary | ICD-10-CM | POA: Diagnosis not present

## 2022-05-13 DIAGNOSIS — C249 Malignant neoplasm of biliary tract, unspecified: Secondary | ICD-10-CM | POA: Diagnosis not present

## 2022-05-13 DIAGNOSIS — I4891 Unspecified atrial fibrillation: Secondary | ICD-10-CM | POA: Diagnosis not present

## 2022-05-13 DIAGNOSIS — A419 Sepsis, unspecified organism: Secondary | ICD-10-CM | POA: Diagnosis not present

## 2022-05-13 DIAGNOSIS — K8309 Other cholangitis: Secondary | ICD-10-CM | POA: Diagnosis not present

## 2022-05-14 ENCOUNTER — Ambulatory Visit (INDEPENDENT_AMBULATORY_CARE_PROVIDER_SITE_OTHER): Payer: Medicare Other

## 2022-05-14 DIAGNOSIS — I959 Hypotension, unspecified: Secondary | ICD-10-CM | POA: Diagnosis not present

## 2022-05-14 DIAGNOSIS — I4891 Unspecified atrial fibrillation: Secondary | ICD-10-CM

## 2022-05-14 DIAGNOSIS — I10 Essential (primary) hypertension: Secondary | ICD-10-CM | POA: Diagnosis not present

## 2022-05-14 DIAGNOSIS — B9620 Unspecified Escherichia coli [E. coli] as the cause of diseases classified elsewhere: Secondary | ICD-10-CM

## 2022-05-14 DIAGNOSIS — E440 Moderate protein-calorie malnutrition: Secondary | ICD-10-CM | POA: Diagnosis not present

## 2022-05-14 DIAGNOSIS — K8309 Other cholangitis: Secondary | ICD-10-CM | POA: Diagnosis not present

## 2022-05-14 DIAGNOSIS — M48062 Spinal stenosis, lumbar region with neurogenic claudication: Secondary | ICD-10-CM | POA: Diagnosis not present

## 2022-05-14 DIAGNOSIS — C249 Malignant neoplasm of biliary tract, unspecified: Secondary | ICD-10-CM

## 2022-05-14 DIAGNOSIS — I491 Atrial premature depolarization: Secondary | ICD-10-CM | POA: Diagnosis not present

## 2022-05-14 DIAGNOSIS — M48061 Spinal stenosis, lumbar region without neurogenic claudication: Secondary | ICD-10-CM

## 2022-05-14 DIAGNOSIS — N390 Urinary tract infection, site not specified: Secondary | ICD-10-CM

## 2022-05-14 DIAGNOSIS — D649 Anemia, unspecified: Secondary | ICD-10-CM

## 2022-05-14 DIAGNOSIS — A419 Sepsis, unspecified organism: Secondary | ICD-10-CM | POA: Diagnosis not present

## 2022-05-19 DIAGNOSIS — C249 Malignant neoplasm of biliary tract, unspecified: Secondary | ICD-10-CM | POA: Diagnosis not present

## 2022-05-19 DIAGNOSIS — Z515 Encounter for palliative care: Secondary | ICD-10-CM | POA: Diagnosis not present

## 2022-05-21 ENCOUNTER — Ambulatory Visit: Payer: Medicare Other | Admitting: Urology

## 2022-05-21 ENCOUNTER — Encounter: Payer: Self-pay | Admitting: Urology

## 2022-05-21 ENCOUNTER — Other Ambulatory Visit: Payer: Medicare Other

## 2022-05-21 VITALS — BP 143/71 | HR 66

## 2022-05-21 DIAGNOSIS — N401 Enlarged prostate with lower urinary tract symptoms: Secondary | ICD-10-CM

## 2022-05-21 DIAGNOSIS — R3912 Poor urinary stream: Secondary | ICD-10-CM

## 2022-05-21 DIAGNOSIS — R634 Abnormal weight loss: Secondary | ICD-10-CM | POA: Diagnosis not present

## 2022-05-21 DIAGNOSIS — N138 Other obstructive and reflux uropathy: Secondary | ICD-10-CM

## 2022-05-21 DIAGNOSIS — N3941 Urge incontinence: Secondary | ICD-10-CM

## 2022-05-21 DIAGNOSIS — K831 Obstruction of bile duct: Secondary | ICD-10-CM | POA: Diagnosis not present

## 2022-05-21 DIAGNOSIS — K8309 Other cholangitis: Secondary | ICD-10-CM | POA: Diagnosis not present

## 2022-05-21 MED ORDER — SILODOSIN 4 MG PO CAPS: 4.0000 mg | ORAL_CAPSULE | Freq: Every day | ORAL | 3 refills | Status: AC

## 2022-05-21 NOTE — Progress Notes (Signed)
Subjective: 1. BPH with urinary obstruction   2. Urge incontinence   3. Weak urinary stream     05/21/22: Terry Hernandez returns today in f/u for the history below.  He has been diagnosed with bile duct CA and has a stent and has gained some weight back since it was changed back in December.   He has nocturia x 1 and remains on the silodosin but is down to 61m.  He continues to have some UUI and uses a pullup but it is not severe. He has had no hematuria or dysuria.  He had a UA in April that had 3-10 RBC's but no infection.     05/15/21: Mr. HChestnutreturns today in f/u for his history of BPH with BOO and OAB wet.  He remains on silodosin and is doing well with an IPSS of 4.  He has nocturia x 2.  He has minimal UUI.  He has no hematuria or dysuria.  He continues to use a pullup just for safety's sake.   He is emptying well.   His PVR is 747mtoday.  10/25/20: Terry Hernandez today in f/u.  He continues to do better with the silodosin and his nocturia remain about 1x.  His IPSS is 8.   He continues to have some urgency with occasional accidents but is doing better on the silodosin 50m1m  His PVR today is 210m950m He has no hematuria or dysuria.   Terry Hernandez 85 y250male who is sent in consultation by Terry Hernandez Miss urinary urgency.  He sees Terry Hernandez spinal stenosis and had a laminectomy in 4/21.  The symptoms predated the surgery but has worsened.  He has UUI and has been using about 3 pads daily.  He has nocturia q1-2hrs.   He has frequency.  He has some intermittency with a weak stream but feels he empties.  He has had no hematuria or dysuria.   He is not on any therapy and has no history of UTI's.  He had one stone but no GU surgery.    Cr was 1.05 on 03/18/20.  He had a CT on 04/16/20 for constipation but had a distended bladder with mild bilateral hydro and moderate prostate enlargement.     ROS:  ROS  Allergies  Allergen Reactions   Cephalexin Rash    REACTION: rash    Past  Medical History:  Diagnosis Date   Arthritis    GERD (gastroesophageal reflux disease)    PMH   Glucose intolerance (impaired glucose tolerance)    High cholesterol    History of kidney stones    HTN (hypertension)    Hyperlipidemia    Lumbar stenosis with neurogenic claudication    Pneumonia    Subcutaneous mass    Wears glasses     Past Surgical History:  Procedure Laterality Date   APPENDECTOMY     BILIARY BRUSHING  10/26/2021   Procedure: BILIARY BRUSHING;  Surgeon: MagoClarene Essex;  [facility]  Service: Endoscopy;;   BILIARY STENT PLACEMENT  10/26/2021   Procedure: BILIARY STENT PLACEMENT;  Surgeon: MagoClarene Essex;  [facility];  Service: Endoscopy;;   BILIARY STENT PLACEMENT  10/30/2021   Procedure: BILIARY STENT PLACEMENT;  Surgeon: MagoClarene Essex;  [facility] [place];;   COLONOSCOPY W/ BIOPSIES AND POLYPECTOMY     ERCP N/A 10/26/2021   Procedure: ENDOSCOPIC RETROGRADE CHOLANGIOPANCREATOGRAPHY (ERCP);  Surgeon: MagoClarene Essex;  [facility];  Service: Endoscopy;  Laterality: N/A;   ERCP N/A 10/30/2021   Procedure: ENDOSCOPIC RETROGRADE CHOLANGIOPANCREATOGRAPHY (ERCP);  Surgeon: Clarene Essex, MD;  Location: [place];  Service: Endoscopy;  Laterality: N/A;   ESOPHAGOGASTRODUODENOSCOPY N/A 10/26/2021   Procedure: ESOPHAGOGASTRODUODENOSCOPY (EGD);  Surgeon: Clarene Essex, MD;  Location: [place];  Service: Endoscopy;  Laterality: N/A;   LUMBAR LAMINECTOMY/DECOMPRESSION MICRODISCECTOMY Bilateral 03/18/2020   Procedure: Bilateral Lumbar three-four Lumbar four-five Laminotomy/foraminotomy;  Surgeon: Kristeen Miss, MD;  Location: [place];  Service: Neurosurgery;  Laterality: Bilateral;   MASS EXCISION Right 03/18/2020   Procedure: Excision of a Right gluteal subcutaneous mass;  Surgeon: Kristeen Miss, MD;  Location: [place];  Service: Neurosurgery;  Laterality: Right;   MULTIPLE TOOTH EXTRACTIONS     SPHINCTEROTOMY   10/26/2021   Procedure: SPHINCTEROTOMY;  Surgeon: Clarene Essex, MD;  Location: [facility] ENDOSCOPY;  Service: Endoscopy;;   STENT REMOVAL  10/30/2021   Procedure: STENT REMOVAL;  Surgeon: Clarene Essex, MD;  Location: [facility] ENDOSCOPY;  Service: Endoscopy;;    Social History   Socioeconomic History   Marital status: Married    Spouse name: Not on file   Number of children: 2   Years of education: Not on file   Highest education level: Not on file  Occupational History   Not on file  Tobacco Use   Smoking status: Never   Smokeless tobacco: Never  Vaping Use   Vaping Use: Never used  Substance and Sexual Activity   Alcohol use: Never   Drug use: Never   Sexual activity: Not Currently  Other Topics Concern   Not on file  Social History Narrative   Not on file   Social Determinants of Health   Financial Resource Strain: Not on file  Food Insecurity: Not on file  Transportation Needs: Not on file  Physical Activity: Not on file  Stress: Not on file  Social Connections: Not on file  Intimate Partner Violence: Not on file    Family History  Problem Relation Age of Onset   Cancer Mother    Stroke Father    Cancer Son        lung    Anti-infectives: Anti-infectives (From admission, onward)    None       Current Outpatient Medications  Medication Sig Dispense Refill   acetaminophen (TYLENOL) 325 MG tablet Take 650 mg by mouth every 6 (six) hours as needed for mild pain or fever.     feeding supplement (ENSURE ENLIVE / ENSURE PLUS) LIQD Take 237 mLs by mouth 3 (three) times daily between meals. 237 mL 12   methocarbamol (ROBAXIN) 500 MG tablet Take 1 tablet (500 mg total) by mouth every 6 (six) hours as needed for muscle spasms. 30 tablet 3   metoprolol succinate (TOPROL-XL) 25 MG 24 hr tablet Take 1.5 tablets (37.5 mg total) by mouth daily. 90 tablet 5   mirtazapine (REMERON) 15 MG tablet TAKE 1/2 TABLET BY MOUTH AT BEDTIME 30 tablet 3   Multiple Vitamin (MULTIVITAMIN) tablet  Take 1 tablet by mouth at bedtime.      senna-docusate (SENOKOT-S) 8.6-50 MG tablet Take 2 tablets by mouth at bedtime. (Patient taking differently: Take 2 tablets by mouth at bedtime as needed for mild constipation.) 60 tablet 1   traMADol (ULTRAM) 50 MG tablet Take 50 mg by mouth every 6 (six) hours as needed for moderate pain.     pantoprazole (PROTONIX) 40 MG tablet Take 1 tablet (40 mg total) by mouth daily.  30 tablet 0   silodosin (RAPAFLO) 4 MG CAPS capsule Take 1 capsule (4 mg total) by mouth daily with breakfast. 90 capsule 3   No current facility-administered medications for this visit.     Objective: Vital signs in last 24 hours: BP (!) 143/71   Pulse 66      Physical Exam  Lab Results:  Results for orders placed or performed in visit on 04/30/22 (from the past 24 hour(s))  Comprehensive metabolic panel     Status: Abnormal   Collection Time: 05/21/22  1:02 PM  Result Value Ref Range   Glucose 168 (H) 70 - 99 mg/dL   BUN 18 8 - 27 mg/dL   Creatinine, Ser 1.32 (H) 0.76 - 1.27 mg/dL   eGFR 53 (L) >59 mL/min/1.73   BUN/Creatinine Ratio 14 10 - 24   Sodium 139 134 - 144 mmol/L   Potassium 4.1 3.5 - 5.2 mmol/L   Chloride 101 96 - 106 mmol/L   CO2 26 20 - 29 mmol/L   Calcium 8.7 8.6 - 10.2 mg/dL   Total Protein 7.4 6.0 - 8.5 g/dL   Albumin 3.5 (L) 3.6 - 4.6 g/dL   Globulin, Total 3.9 1.5 - 4.5 g/dL   Albumin/Globulin Ratio 0.9 (L) 1.2 - 2.2   Bilirubin Total 0.5 0.0 - 1.2 mg/dL   Alkaline Phosphatase 502 (H) 44 - 121 IU/L   AST 46 (H) 0 - 40 IU/L   ALT 24 0 - 44 IU/L   Narrative   Performed at:  [address], [place], [place]  [id number] Lab Director: Rush Farmer MD, Phone:  [phone]  CBC     Status: Abnormal   Collection Time: 05/21/22  1:02 PM  Result Value Ref Range   WBC 9.5 3.4 - 10.8 x10E3/uL   RBC 3.30 (L) 4.14 - 5.80 x10E6/uL   Hemoglobin 10.4 (L) 13.0 - 17.7 g/dL   Hematocrit 30.9 (L) 37.5 - 51.0 %   MCV 94 79 - 97 fL    MCH 31.5 26.6 - 33.0 pg   MCHC 33.7 31.5 - 35.7 g/dL   RDW 13.0 11.6 - 15.4 %   Platelets 329 150 - 450 x10E3/uL   Narrative   Performed at:  [facility] [address], [place], [place]  [id number] Lab Director: Rush Farmer MD, Phone:  [phone]     BMET Recent Labs    05/21/22 1302  NA 139  K 4.1  CL 101  CO2 26  GLUCOSE 168*  BUN 18  CREATININE 1.32*  CALCIUM 8.7   PT/INR No results for input(s): "LABPROT", "INR" in the last 72 hours. ABG No results for input(s): "PHART", "HCO3" in the last 72 hours.  Invalid input(s): "PCO2", "PO2" UA is clear.  Studies/Results:     Assessment/Plan: Urgency and UUI with incomplete bladder emptying with  BPH with BOO.   He continues to do well on silodosin 1m daily.  I have refilled that and will have him return in a year.   Meds ordered this encounter  Medications   silodosin (RAPAFLO) 4 MG CAPS capsule    Sig: Take 1 capsule (4 mg total) by mouth daily with breakfast.    Dispense:  90 capsule    Refill:  3     Orders Placed This Encounter  Procedures   Urinalysis, Routine w reflex microscopic     Return in about 1 year (around 05/22/2023).    CC: Dr. HKristeen Missand Dr.  Consuello Masse.      Irine Seal 05/22/2022 [phone] ID: Terry Hernandez, male   DOB: September 18, 1934, 88 y.o.   MRN: [id number]

## 2022-05-22 ENCOUNTER — Inpatient Hospital Stay: Payer: Medicare Other

## 2022-05-22 LAB — CBC
Hematocrit: 30.9 % — ABNORMAL LOW (ref 37.5–51.0)
Hemoglobin: 10.4 g/dL — ABNORMAL LOW (ref 13.0–17.7)
MCH: 31.5 pg (ref 26.6–33.0)
MCHC: 33.7 g/dL (ref 31.5–35.7)
MCV: 94 fL (ref 79–97)
Platelets: 329 10*3/uL (ref 150–450)
RBC: 3.3 x10E6/uL — ABNORMAL LOW (ref 4.14–5.80)
RDW: 13 % (ref 11.6–15.4)
WBC: 9.5 10*3/uL (ref 3.4–10.8)

## 2022-05-22 LAB — COMPREHENSIVE METABOLIC PANEL
ALT: 24 IU/L (ref 0–44)
AST: 46 IU/L — ABNORMAL HIGH (ref 0–40)
Albumin/Globulin Ratio: 0.9 — ABNORMAL LOW (ref 1.2–2.2)
Albumin: 3.5 g/dL — ABNORMAL LOW (ref 3.6–4.6)
Alkaline Phosphatase: 502 IU/L — ABNORMAL HIGH (ref 44–121)
BUN/Creatinine Ratio: 14 (ref 10–24)
BUN: 18 mg/dL (ref 8–27)
Bilirubin Total: 0.5 mg/dL (ref 0.0–1.2)
CO2: 26 mmol/L (ref 20–29)
Calcium: 8.7 mg/dL (ref 8.6–10.2)
Chloride: 101 mmol/L (ref 96–106)
Creatinine, Ser: 1.32 mg/dL — ABNORMAL HIGH (ref 0.76–1.27)
Globulin, Total: 3.9 g/dL (ref 1.5–4.5)
Glucose: 168 mg/dL — ABNORMAL HIGH (ref 70–99)
Potassium: 4.1 mmol/L (ref 3.5–5.2)
Sodium: 139 mmol/L (ref 134–144)
Total Protein: 7.4 g/dL (ref 6.0–8.5)
eGFR: 53 mL/min/{1.73_m2} — ABNORMAL LOW (ref >59)

## 2022-05-25 ENCOUNTER — Encounter: Payer: Self-pay | Admitting: Orthopedic Surgery

## 2022-05-25 ENCOUNTER — Ambulatory Visit: Payer: Medicare Other | Admitting: Orthopedic Surgery

## 2022-05-25 ENCOUNTER — Ambulatory Visit: Payer: Medicare Other

## 2022-05-25 ENCOUNTER — Ambulatory Visit (INDEPENDENT_AMBULATORY_CARE_PROVIDER_SITE_OTHER): Payer: Medicare Other

## 2022-05-25 DIAGNOSIS — M1712 Unilateral primary osteoarthritis, left knee: Secondary | ICD-10-CM | POA: Diagnosis not present

## 2022-05-25 NOTE — Progress Notes (Signed)
New Patient Visit  Assessment: Terry Hernandez is a 88 y.o. male with the following: 1. Arthritis of left knee  Plan: [name] has severe left knee arthritis.  He requires a walker to assist with ambulation.  He has previously had injections with good effect.  Injection was completed today without a problem.  Follow up as needed.   Procedure note injection Left knee joint   Verbal consent was obtained to inject the left knee joint  Timeout was completed to confirm the site of injection.  The skin was prepped with alcohol and ethyl chloride was sprayed at the injection site.  A 21-gauge needle was used to inject 40 mg of Depo-Medrol and 1% lidocaine (3 cc) into the left knee using an anterolateral approach.  There were no complications. A sterile bandage was applied.     Follow-up: Return if symptoms worsen or fail to improve.  Subjective:  Chief Complaint  Patient presents with   Knee Pain    Left knee pain, swelling.    History of Present Illness: Terry Hernandez is a 88 y.o. male who has been referred by  Marjorie Smolder, FNP for evaluation of left knee pain.   He has had progressively worsening left knee pain for years.  He has had injections and aspirations in the past.  He uses a walker to assist with ambulation.  Last injection was greater than 6 months ago.    Review of Systems: No fevers or chills No numbness or tingling No chest pain No shortness of breath No bowel or bladder dysfunction No GI distress No headaches   Medical History:  Past Medical History:  Diagnosis Date   Arthritis    GERD (gastroesophageal reflux disease)    PMH   Glucose intolerance (impaired glucose tolerance)    High cholesterol    History of kidney stones    HTN (hypertension)    Hyperlipidemia    Lumbar stenosis with neurogenic claudication    Pneumonia    Subcutaneous mass    Wears glasses     Past Surgical History:  Procedure Laterality Date   APPENDECTOMY     BILIARY  BRUSHING  10/26/2021   Procedure: BILIARY BRUSHING;  Surgeon: Clarene Essex, MD;  Location: [facility] ENDOSCOPY;  Service: Endoscopy;;   BILIARY STENT PLACEMENT  10/26/2021   Procedure: BILIARY STENT PLACEMENT;  Surgeon: Clarene Essex, MD;  Location: [facility] ENDOSCOPY;  Service: Endoscopy;;   BILIARY STENT PLACEMENT  10/30/2021   Procedure: BILIARY STENT PLACEMENT;  Surgeon: Clarene Essex, MD;  Location: [place];  Service: Endoscopy;;   COLONOSCOPY W/ BIOPSIES AND POLYPECTOMY     ERCP N/A 10/26/2021   Procedure: ENDOSCOPIC RETROGRADE CHOLANGIOPANCREATOGRAPHY (ERCP);  Surgeon: Clarene Essex, MD;  Location: [place];  Service: Endoscopy;  Laterality: N/A;   ERCP N/A 10/30/2021   Procedure: ENDOSCOPIC RETROGRADE CHOLANGIOPANCREATOGRAPHY (ERCP);  Surgeon: Clarene Essex, MD;  Location: [place];  Service: Endoscopy;  Laterality: N/A;   ESOPHAGOGASTRODUODENOSCOPY N/A 10/26/2021   Procedure: ESOPHAGOGASTRODUODENOSCOPY (EGD);  Surgeon: Clarene Essex, MD;  Location: [place];  Service: Endoscopy;  Laterality: N/A;   LUMBAR LAMINECTOMY/DECOMPRESSION MICRODISCECTOMY Bilateral 03/18/2020   Procedure: Bilateral Lumbar three-four Lumbar four-five Laminotomy/foraminotomy;  Surgeon: Kristeen Miss, MD;  Location: [place];  Service: Neurosurgery;  Laterality: Bilateral;   MASS EXCISION Right 03/18/2020   Procedure: Excision of a Right gluteal subcutaneous mass;  Surgeon: Kristeen Miss, MD;  Location: [place];  Service: Neurosurgery;  Laterality: Right;   MULTIPLE TOOTH EXTRACTIONS  SPHINCTEROTOMY  10/26/2021   Procedure: SPHINCTEROTOMY;  Surgeon: Clarene Essex, MD;  Location: [facility] ENDOSCOPY;  Service: Endoscopy;;   STENT REMOVAL  10/30/2021   Procedure: STENT REMOVAL;  Surgeon: Clarene Essex, MD;  Location: [facility] ENDOSCOPY;  Service: Endoscopy;;    Family History  Problem Relation Age of Onset   Cancer Mother    Stroke Father    Cancer Son        lung   Social History   Tobacco Use   Smoking status: Never   Smokeless  tobacco: Never  Vaping Use   Vaping Use: Never used  Substance Use Topics   Alcohol use: Never   Drug use: Never    Allergies  Allergen Reactions   Cephalexin Rash    REACTION: rash    Current Meds  Medication Sig   acetaminophen (TYLENOL) 325 MG tablet Take 650 mg by mouth every 6 (six) hours as needed for mild pain or fever.   feeding supplement (ENSURE ENLIVE / ENSURE PLUS) LIQD Take 237 mLs by mouth 3 (three) times daily between meals.   methocarbamol (ROBAXIN) 500 MG tablet Take 1 tablet (500 mg total) by mouth every 6 (six) hours as needed for muscle spasms.   metoprolol succinate (TOPROL-XL) 25 MG 24 hr tablet Take 1.5 tablets (37.5 mg total) by mouth daily.   mirtazapine (REMERON) 15 MG tablet TAKE 1/2 TABLET BY MOUTH AT BEDTIME   Multiple Vitamin (MULTIVITAMIN) tablet Take 1 tablet by mouth at bedtime.    senna-docusate (SENOKOT-S) 8.6-50 MG tablet Take 2 tablets by mouth at bedtime. (Patient taking differently: Take 2 tablets by mouth at bedtime as needed for mild constipation.)   silodosin (RAPAFLO) 4 MG CAPS capsule Take 1 capsule (4 mg total) by mouth daily with breakfast.   traMADol (ULTRAM) 50 MG tablet Take 50 mg by mouth every 6 (six) hours as needed for moderate pain.    Objective: There were no vitals taken for this visit.  Physical Exam:  General: Elderly male., Alert and oriented., and No acute distress. Gait: Ambulates with the assistance of a walker.  Left knee in thin male with deformity.  15 degree flexion contracture.  Tolerates flexion beyond 90 degrees.  Tenderness over the medial joint line.  Mild effusion.   IMAGING: I personally ordered and reviewed the following images  XR of the left knee demonstrates severe, bone on bone arthritis.  Mild varus alignment.  Osteophytes in all 3 compartments.   Impression: severe left knee arthritis.  New Medications:  No orders of the defined types were placed in this encounter.     Mordecai Rasmussen,  MD  05/25/2022 3:16 PM

## 2022-05-25 NOTE — Patient Instructions (Signed)

## 2022-06-03 ENCOUNTER — Encounter (HOSPITAL_COMMUNITY): Payer: Self-pay | Admitting: Emergency Medicine

## 2022-06-03 ENCOUNTER — Emergency Department (HOSPITAL_COMMUNITY): Payer: Medicare Other

## 2022-06-03 ENCOUNTER — Other Ambulatory Visit: Payer: Self-pay

## 2022-06-03 ENCOUNTER — Inpatient Hospital Stay (HOSPITAL_COMMUNITY)
Admission: EM | Admit: 2022-06-03 | Discharge: 2022-06-08 | DRG: 871 | Disposition: A | Discharge status: Home / Self Care | Payer: Medicare Other | Attending: Family Medicine | Admitting: Family Medicine

## 2022-06-03 DIAGNOSIS — R54 Age-related physical debility: Secondary | ICD-10-CM | POA: Diagnosis not present

## 2022-06-03 DIAGNOSIS — L89152 Pressure ulcer of sacral region, stage 2: Secondary | ICD-10-CM | POA: Diagnosis not present

## 2022-06-03 DIAGNOSIS — R188 Other ascites: Secondary | ICD-10-CM | POA: Diagnosis not present

## 2022-06-03 DIAGNOSIS — R531 Weakness: Secondary | ICD-10-CM | POA: Diagnosis not present

## 2022-06-03 DIAGNOSIS — N179 Acute kidney failure, unspecified: Secondary | ICD-10-CM | POA: Diagnosis not present

## 2022-06-03 DIAGNOSIS — M1712 Unilateral primary osteoarthritis, left knee: Secondary | ICD-10-CM | POA: Diagnosis not present

## 2022-06-03 DIAGNOSIS — R739 Hyperglycemia, unspecified: Secondary | ICD-10-CM | POA: Diagnosis not present

## 2022-06-03 DIAGNOSIS — E872 Acidosis, unspecified: Secondary | ICD-10-CM | POA: Diagnosis present

## 2022-06-03 DIAGNOSIS — C240 Malignant neoplasm of extrahepatic bile duct: Secondary | ICD-10-CM | POA: Diagnosis not present

## 2022-06-03 DIAGNOSIS — A4159 Other Gram-negative sepsis: Secondary | ICD-10-CM | POA: Diagnosis not present

## 2022-06-03 DIAGNOSIS — I480 Paroxysmal atrial fibrillation: Secondary | ICD-10-CM | POA: Diagnosis not present

## 2022-06-03 DIAGNOSIS — K7200 Acute and subacute hepatic failure without coma: Secondary | ICD-10-CM

## 2022-06-03 DIAGNOSIS — E162 Hypoglycemia, unspecified: Secondary | ICD-10-CM | POA: Diagnosis not present

## 2022-06-03 DIAGNOSIS — R6520 Severe sepsis without septic shock: Secondary | ICD-10-CM | POA: Diagnosis not present

## 2022-06-03 DIAGNOSIS — I10 Essential (primary) hypertension: Secondary | ICD-10-CM | POA: Diagnosis present

## 2022-06-03 DIAGNOSIS — Z809 Family history of malignant neoplasm, unspecified: Secondary | ICD-10-CM

## 2022-06-03 DIAGNOSIS — Z20822 Contact with and (suspected) exposure to covid-19: Secondary | ICD-10-CM | POA: Diagnosis not present

## 2022-06-03 DIAGNOSIS — K219 Gastro-esophageal reflux disease without esophagitis: Secondary | ICD-10-CM | POA: Diagnosis present

## 2022-06-03 DIAGNOSIS — R000 Tachycardia, unspecified: Secondary | ICD-10-CM | POA: Diagnosis not present

## 2022-06-03 DIAGNOSIS — L8990 Pressure ulcer of unspecified site, unspecified stage: Secondary | ICD-10-CM | POA: Insufficient documentation

## 2022-06-03 DIAGNOSIS — A419 Sepsis, unspecified organism: Secondary | ICD-10-CM | POA: Diagnosis present

## 2022-06-03 DIAGNOSIS — Z0389 Encounter for observation for other suspected diseases and conditions ruled out: Secondary | ICD-10-CM | POA: Diagnosis not present

## 2022-06-03 DIAGNOSIS — J90 Pleural effusion, not elsewhere classified: Secondary | ICD-10-CM | POA: Diagnosis present

## 2022-06-03 DIAGNOSIS — E440 Moderate protein-calorie malnutrition: Secondary | ICD-10-CM

## 2022-06-03 DIAGNOSIS — G9341 Metabolic encephalopathy: Secondary | ICD-10-CM | POA: Diagnosis not present

## 2022-06-03 DIAGNOSIS — Z823 Family history of stroke: Secondary | ICD-10-CM

## 2022-06-03 DIAGNOSIS — C787 Secondary malignant neoplasm of liver and intrahepatic bile duct: Secondary | ICD-10-CM | POA: Diagnosis not present

## 2022-06-03 DIAGNOSIS — K8309 Other cholangitis: Secondary | ICD-10-CM | POA: Diagnosis present

## 2022-06-03 DIAGNOSIS — E7800 Pure hypercholesterolemia, unspecified: Secondary | ICD-10-CM | POA: Diagnosis not present

## 2022-06-03 DIAGNOSIS — E876 Hypokalemia: Secondary | ICD-10-CM | POA: Diagnosis not present

## 2022-06-03 DIAGNOSIS — E43 Unspecified severe protein-calorie malnutrition: Secondary | ICD-10-CM | POA: Diagnosis not present

## 2022-06-03 DIAGNOSIS — R509 Fever, unspecified: Secondary | ICD-10-CM | POA: Diagnosis not present

## 2022-06-03 DIAGNOSIS — Z515 Encounter for palliative care: Secondary | ICD-10-CM | POA: Diagnosis not present

## 2022-06-03 DIAGNOSIS — R404 Transient alteration of awareness: Secondary | ICD-10-CM | POA: Diagnosis not present

## 2022-06-03 DIAGNOSIS — Z66 Do not resuscitate: Secondary | ICD-10-CM | POA: Diagnosis not present

## 2022-06-03 DIAGNOSIS — R600 Localized edema: Secondary | ICD-10-CM | POA: Diagnosis not present

## 2022-06-03 DIAGNOSIS — Z681 Body mass index (BMI) 19 or less, adult: Secondary | ICD-10-CM

## 2022-06-03 DIAGNOSIS — E161 Other hypoglycemia: Secondary | ICD-10-CM | POA: Diagnosis not present

## 2022-06-03 DIAGNOSIS — R109 Unspecified abdominal pain: Secondary | ICD-10-CM | POA: Diagnosis not present

## 2022-06-03 DIAGNOSIS — Z881 Allergy status to other antibiotic agents status: Secondary | ICD-10-CM | POA: Diagnosis not present

## 2022-06-03 DIAGNOSIS — F05 Delirium due to known physiological condition: Secondary | ICD-10-CM | POA: Diagnosis present

## 2022-06-03 DIAGNOSIS — R4182 Altered mental status, unspecified: Secondary | ICD-10-CM | POA: Diagnosis not present

## 2022-06-03 DIAGNOSIS — Z7189 Other specified counseling: Secondary | ICD-10-CM | POA: Diagnosis not present

## 2022-06-03 DIAGNOSIS — Z743 Need for continuous supervision: Secondary | ICD-10-CM | POA: Diagnosis not present

## 2022-06-03 DIAGNOSIS — I4891 Unspecified atrial fibrillation: Secondary | ICD-10-CM | POA: Diagnosis present

## 2022-06-03 DIAGNOSIS — R7401 Elevation of levels of liver transaminase levels: Secondary | ICD-10-CM

## 2022-06-03 DIAGNOSIS — G9340 Encephalopathy, unspecified: Secondary | ICD-10-CM | POA: Diagnosis not present

## 2022-06-03 DIAGNOSIS — Z85038 Personal history of other malignant neoplasm of large intestine: Secondary | ICD-10-CM

## 2022-06-03 DIAGNOSIS — Z801 Family history of malignant neoplasm of trachea, bronchus and lung: Secondary | ICD-10-CM

## 2022-06-03 DIAGNOSIS — K769 Liver disease, unspecified: Secondary | ICD-10-CM | POA: Diagnosis not present

## 2022-06-03 DIAGNOSIS — T68XXXA Hypothermia, initial encounter: Secondary | ICD-10-CM | POA: Diagnosis not present

## 2022-06-03 DIAGNOSIS — Z79899 Other long term (current) drug therapy: Secondary | ICD-10-CM | POA: Diagnosis not present

## 2022-06-03 HISTORY — DX: Malignant neoplasm of liver, not specified as primary or secondary: C22.9

## 2022-06-03 HISTORY — DX: Malignant (primary) neoplasm, unspecified: C80.1

## 2022-06-03 HISTORY — DX: Malignant neoplasm of colon, unspecified: C18.9

## 2022-06-03 LAB — URINALYSIS, ROUTINE W REFLEX MICROSCOPIC
Bilirubin Urine: NEGATIVE
Glucose, UA: 50 mg/dL — AB
Ketones, ur: NEGATIVE mg/dL
Leukocytes,Ua: NEGATIVE
Nitrite: NEGATIVE
Protein, ur: 100 mg/dL — AB
Specific Gravity, Urine: 1.027 (ref 1.005–1.030)
pH: 5 (ref 5.0–8.0)

## 2022-06-03 LAB — CBC WITH DIFFERENTIAL/PLATELET
Abs Immature Granulocytes: 0.39 10*3/uL — ABNORMAL HIGH (ref 0.00–0.07)
Basophils Absolute: 0.1 10*3/uL (ref 0.0–0.1)
Basophils Relative: 0 %
Eosinophils Absolute: 0 10*3/uL (ref 0.0–0.5)
Eosinophils Relative: 0 %
HCT: 29.8 % — ABNORMAL LOW (ref 39.0–52.0)
Hemoglobin: 9.7 g/dL — ABNORMAL LOW (ref 13.0–17.0)
Immature Granulocytes: 2 %
Lymphocytes Relative: 4 %
Lymphs Abs: 0.6 10*3/uL — ABNORMAL LOW (ref 0.7–4.0)
MCH: 31.4 pg (ref 26.0–34.0)
MCHC: 32.6 g/dL (ref 30.0–36.0)
MCV: 96.4 fL (ref 80.0–100.0)
Monocytes Absolute: 0.8 10*3/uL (ref 0.1–1.0)
Monocytes Relative: 5 %
Neutro Abs: 14.9 10*3/uL — ABNORMAL HIGH (ref 1.7–7.7)
Neutrophils Relative %: 89 %
Platelets: 334 10*3/uL (ref 150–400)
RBC: 3.09 MIL/uL — ABNORMAL LOW (ref 4.22–5.81)
RDW: 15.2 % (ref 11.5–15.5)
WBC: 16.7 10*3/uL — ABNORMAL HIGH (ref 4.0–10.5)
nRBC: 0 % (ref 0.0–0.2)

## 2022-06-03 LAB — COMPREHENSIVE METABOLIC PANEL
ALT: 54 U/L — ABNORMAL HIGH (ref 0–44)
AST: 124 U/L — ABNORMAL HIGH (ref 15–41)
Albumin: 2.5 g/dL — ABNORMAL LOW (ref 3.5–5.0)
Alkaline Phosphatase: 625 U/L — ABNORMAL HIGH (ref 38–126)
Anion gap: 9 (ref 5–15)
BUN: 23 mg/dL (ref 8–23)
CO2: 22 mmol/L (ref 22–32)
Calcium: 7.7 mg/dL — ABNORMAL LOW (ref 8.9–10.3)
Chloride: 106 mmol/L (ref 98–111)
Creatinine, Ser: 1.27 mg/dL — ABNORMAL HIGH (ref 0.61–1.24)
GFR, Estimated: 55 mL/min — ABNORMAL LOW (ref >60)
Glucose, Bld: 193 mg/dL — ABNORMAL HIGH (ref 70–99)
Potassium: 3.1 mmol/L — ABNORMAL LOW (ref 3.5–5.1)
Sodium: 137 mmol/L (ref 135–145)
Total Bilirubin: 2.8 mg/dL — ABNORMAL HIGH (ref 0.3–1.2)
Total Protein: 6.9 g/dL (ref 6.5–8.1)

## 2022-06-03 LAB — PROTIME-INR
INR: 1.2 (ref 0.8–1.2)
Prothrombin Time: 15.2 seconds (ref 11.4–15.2)

## 2022-06-03 LAB — MAGNESIUM: Magnesium: 1.9 mg/dL (ref 1.7–2.4)

## 2022-06-03 LAB — RESP PANEL BY RT-PCR (FLU A&B, COVID) ARPGX2
Influenza A by PCR: NEGATIVE
Influenza B by PCR: NEGATIVE
SARS Coronavirus 2 by RT PCR: NEGATIVE

## 2022-06-03 LAB — LACTIC ACID, PLASMA
Lactic Acid, Venous: 3.3 mmol/L (ref 0.5–1.9)
Lactic Acid, Venous: 3.4 mmol/L (ref 0.5–1.9)
Lactic Acid, Venous: 2.5 mmol/L (ref 0.5–1.9)

## 2022-06-03 LAB — PROCALCITONIN: Procalcitonin: 4.52 ng/mL

## 2022-06-03 MED ORDER — ONDANSETRON HCL 4 MG PO TABS: 4.0000 mg | ORAL_TABLET | Freq: Four times a day (QID) | ORAL | Status: DC | PRN

## 2022-06-03 MED ORDER — VANCOMYCIN HCL IN DEXTROSE 1-5 GM/200ML-% IV SOLN: 1000.0000 mg | Freq: Once | INTRAVENOUS | Status: DC

## 2022-06-03 MED ORDER — LACTATED RINGERS IV BOLUS (SEPSIS)
500.0000 mL | Freq: Once | INTRAVENOUS | Status: AC
  Administered 2022-06-03: 500 mL via INTRAVENOUS

## 2022-06-03 MED ORDER — LACTATED RINGERS IV SOLN
INTRAVENOUS | Status: DC
Start: 2022-06-03 — End: 2022-06-04

## 2022-06-03 MED ORDER — TRAMADOL HCL 50 MG PO TABS
50.0000 mg | ORAL_TABLET | Freq: Four times a day (QID) | ORAL | Status: DC | PRN
  Administered 2022-06-03: 50 mg via ORAL
  Filled 2022-06-03: qty 1

## 2022-06-03 MED ORDER — METRONIDAZOLE 500 MG/100ML IV SOLN
500.0000 mg | Freq: Once | INTRAVENOUS | Status: AC
Start: 2022-06-03 — End: 2022-06-03
  Administered 2022-06-03: 500 mg via INTRAVENOUS
  Filled 2022-06-03: qty 100

## 2022-06-03 MED ORDER — PANTOPRAZOLE SODIUM 40 MG PO TBEC
40.0000 mg | DELAYED_RELEASE_TABLET | Freq: Every day | ORAL | Status: DC
  Administered 2022-06-03 – 2022-06-08 (×5): 40 mg via ORAL
  Filled 2022-06-03 (×5): qty 1

## 2022-06-03 MED ORDER — POLYETHYLENE GLYCOL 3350 17 G PO PACK: 17.0000 g | PACK | Freq: Every day | ORAL | Status: DC | PRN

## 2022-06-03 MED ORDER — CHLORHEXIDINE GLUCONATE CLOTH 2 % EX PADS
6.0000 | MEDICATED_PAD | Freq: Every day | CUTANEOUS | Status: DC
  Administered 2022-06-04 – 2022-06-08 (×5): 6 via TOPICAL

## 2022-06-03 MED ORDER — HEPARIN SODIUM (PORCINE) 5000 UNIT/ML IJ SOLN
5000.0000 [IU] | Freq: Three times a day (TID) | INTRAMUSCULAR | Status: DC
  Administered 2022-06-03 – 2022-06-08 (×14): 5000 [IU] via SUBCUTANEOUS
  Filled 2022-06-03 (×15): qty 1

## 2022-06-03 MED ORDER — ACETAMINOPHEN 325 MG PO TABS: 650.0000 mg | ORAL_TABLET | Freq: Four times a day (QID) | ORAL | Status: DC | PRN

## 2022-06-03 MED ORDER — METRONIDAZOLE 500 MG/100ML IV SOLN
500.0000 mg | Freq: Two times a day (BID) | INTRAVENOUS | Status: DC
  Administered 2022-06-04 – 2022-06-07 (×8): 500 mg via INTRAVENOUS
  Filled 2022-06-03 (×8): qty 100

## 2022-06-03 MED ORDER — SODIUM CHLORIDE 0.9 % IV SOLN
2.0000 g | Freq: Once | INTRAVENOUS | Status: AC
  Administered 2022-06-03: 2 g via INTRAVENOUS
  Filled 2022-06-03: qty 12.5

## 2022-06-03 MED ORDER — POTASSIUM CHLORIDE 20 MEQ PO PACK
40.0000 meq | PACK | Freq: Once | ORAL | Status: AC
  Administered 2022-06-03: 40 meq via ORAL
  Filled 2022-06-03: qty 2

## 2022-06-03 MED ORDER — METOPROLOL SUCCINATE ER 25 MG PO TB24
37.5000 mg | ORAL_TABLET | Freq: Every day | ORAL | Status: DC
  Administered 2022-06-04 – 2022-06-08 (×4): 37.5 mg via ORAL
  Filled 2022-06-03 (×4): qty 2

## 2022-06-03 MED ORDER — ONDANSETRON HCL 4 MG/2ML IJ SOLN: 4.0000 mg | Freq: Four times a day (QID) | INTRAMUSCULAR | Status: DC | PRN

## 2022-06-03 MED ORDER — LACTATED RINGERS IV BOLUS (SEPSIS)
1000.0000 mL | Freq: Once | INTRAVENOUS | Status: AC
Start: 2022-06-03 — End: 2022-06-03
  Administered 2022-06-03: 1000 mL via INTRAVENOUS

## 2022-06-03 MED ORDER — VANCOMYCIN HCL IN DEXTROSE 1-5 GM/200ML-% IV SOLN
1000.0000 mg | INTRAVENOUS | Status: DC
  Administered 2022-06-03: 1000 mg via INTRAVENOUS
  Filled 2022-06-03: qty 200

## 2022-06-03 MED ORDER — ACETAMINOPHEN 325 MG PO TABS
650.0000 mg | ORAL_TABLET | Freq: Once | ORAL | Status: AC
Start: 2022-06-03 — End: 2022-06-03
  Administered 2022-06-03: 650 mg via ORAL
  Filled 2022-06-03: qty 2

## 2022-06-03 MED ORDER — ACETAMINOPHEN 650 MG RE SUPP: 650.0000 mg | Freq: Four times a day (QID) | RECTAL | Status: DC | PRN

## 2022-06-03 MED ORDER — SODIUM CHLORIDE 0.9 % IV SOLN
2.0000 g | Freq: Two times a day (BID) | INTRAVENOUS | Status: DC
  Administered 2022-06-04: 2 g via INTRAVENOUS
  Filled 2022-06-03: qty 12.5

## 2022-06-03 MED ORDER — MIRTAZAPINE 15 MG PO TABS
7.5000 mg | ORAL_TABLET | Freq: Every day | ORAL | Status: DC
  Administered 2022-06-03 – 2022-06-07 (×4): 7.5 mg via ORAL
  Filled 2022-06-03 (×5): qty 1

## 2022-06-03 MED ORDER — IOHEXOL 300 MG/ML  SOLN
100.0000 mL | Freq: Once | INTRAMUSCULAR | Status: AC | PRN
  Administered 2022-06-03: 80 mL via INTRAVENOUS

## 2022-06-03 MED ORDER — TAMSULOSIN HCL 0.4 MG PO CAPS
0.4000 mg | ORAL_CAPSULE | Freq: Every day | ORAL | Status: DC
  Administered 2022-06-04 – 2022-06-07 (×3): 0.4 mg via ORAL
  Filled 2022-06-03 (×4): qty 1

## 2022-06-03 MED ORDER — LACTATED RINGERS IV BOLUS (SEPSIS)
250.0000 mL | Freq: Once | INTRAVENOUS | Status: AC
  Administered 2022-06-03: 250 mL via INTRAVENOUS

## 2022-06-03 NOTE — ED Notes (Signed)
Lab at bedside

## 2022-06-03 NOTE — Assessment & Plan Note (Addendum)
-  K+ 3.1 >>3.6  -2/2 Poor PO intake -Replace - mag 1.9 -Continue to monitiorg-and repleting accordingly

## 2022-06-03 NOTE — Assessment & Plan Note (Addendum)
-  Monitoring closely T bili up to 2.8 from, 0.5 2/2 cholangitis Continue treatment for cholangitis, and continue to monitor on CMP -Appreciate GI input

## 2022-06-03 NOTE — ED Provider Notes (Signed)
[facility] EMERGENCY DEPARTMENT Provider Note   CSN: [id number] Arrival date & time: 06/03/22  1644     History  Chief Complaint  Patient presents with   Fever    Terry Hernandez is a 88 y.o. male.   Fever Patient presents for fever.  Medical history includes arthritis, GERD, HLD, HTN, and cholangiocarcinoma.  Per EMS, patient was in his normal state of health yesterday.  This morning, he was found to have a fever at home.  He did receive 400 mg of ibuprofen with EMS as well as 500 cc of IVF.  Currently, patient has no physical complaints.   History per son: Patient son lives next door and sees him daily.  He has had decreased appetite over the past 2 to 3 days.  At baseline, he is able to ambulate with a walker.  He is also alert and oriented at baseline.  Today, patient was unable to ambulate at all.  Fever was identified at home with a Tmax of 102 degrees.  Presentation is similar to his hospitalization in April, during which time they believed he had cholangitis.     Home Medications Prior to Admission medications   Medication Sig Start Date End Date Taking? Authorizing Provider  acetaminophen (TYLENOL) 325 MG tablet Take 650 mg by mouth every 6 (six) hours as needed for mild pain or fever.   Yes [provider]  ferrous sulfate 325 (65 FE) MG EC tablet Take 325 mg by mouth daily with breakfast.   Yes [provider]  methocarbamol (ROBAXIN) 500 MG tablet Take 1 tablet (500 mg total) by mouth every 6 (six) hours as needed for muscle spasms. 03/19/20  Yes Kristeen Miss, MD  metoprolol succinate (TOPROL-XL) 25 MG 24 hr tablet Take 1.5 tablets (37.5 mg total) by mouth daily. 01/14/22  Yes Buford Dresser, MD  mirtazapine (REMERON) 15 MG tablet TAKE 1/2 TABLET BY MOUTH AT BEDTIME 03/18/22  Yes [name], FNP  Multiple Vitamin (MULTIVITAMIN) tablet Take 1 tablet by mouth at bedtime.    Yes [provider]  pantoprazole (PROTONIX) 40 MG tablet Take 1  tablet (40 mg total) by mouth daily. 10/29/21 06/03/22 Yes Aline August, MD  senna-docusate (SENOKOT-S) 8.6-50 MG tablet Take 2 tablets by mouth at bedtime. Patient taking differently: Take 2 tablets by mouth at bedtime as needed for mild constipation. 11/17/21 11/17/22 Yes Roxan Hockey, MD  silodosin (RAPAFLO) 4 MG CAPS capsule Take 1 capsule (4 mg total) by mouth daily with breakfast. 05/21/22  Yes Irine Seal, MD  traMADol (ULTRAM) 50 MG tablet Take 50 mg by mouth every 6 (six) hours as needed for moderate pain. 01/14/22  Yes [provider]  feeding supplement (ENSURE ENLIVE / ENSURE PLUS) LIQD Take 237 mLs by mouth 3 (three) times daily between meals. 11/17/21   Roxan Hockey, MD      Allergies    Cephalexin    Review of Systems   Review of Systems  Constitutional:  Positive for activity change, appetite change and fever.  Neurological:  Positive for weakness (Generalized).  All other systems reviewed and are negative.   Physical Exam Updated Vital Signs BP (!) 101/59   Pulse 80   Temp 98.4 F (36.9 C) (Oral)   Resp 12   Ht 6' (1.829 m)   Wt 66.3 kg   SpO2 98%   BMI 19.82 kg/m  Physical Exam Vitals and nursing note reviewed.  Constitutional:      General: He is  not in acute distress.    Appearance: Normal appearance. He is well-developed. He is not ill-appearing, toxic-appearing or diaphoretic.  HENT:     Head: Normocephalic and atraumatic.     Right Ear: External ear normal.     Left Ear: External ear normal.     Nose: Nose normal.     Mouth/Throat:     Mouth: Mucous membranes are moist.     Pharynx: Oropharynx is clear.  Eyes:     Extraocular Movements: Extraocular movements intact.     Conjunctiva/sclera: Conjunctivae normal.  Cardiovascular:     Rate and Rhythm: Regular rhythm. Tachycardia present.     Heart sounds: No murmur heard. Pulmonary:     Effort: Pulmonary effort is normal. No respiratory distress.     Breath sounds: Normal breath sounds.  No wheezing or rales.  Chest:     Chest wall: No tenderness.  Abdominal:     Palpations: Abdomen is soft.     Tenderness: There is no abdominal tenderness.  Musculoskeletal:        General: No swelling.     Cervical back: Normal range of motion and neck supple.     Right lower leg: Edema present.     Left lower leg: Edema present.  Skin:    General: Skin is warm and dry.     Capillary Refill: Capillary refill takes less than 2 seconds.     Coloration: Skin is pale. Skin is not jaundiced.  Neurological:     General: No focal deficit present.     Mental Status: He is alert. He is disoriented.     Cranial Nerves: No cranial nerve deficit.     Sensory: No sensory deficit.     Motor: No weakness.     Coordination: Coordination normal.  Psychiatric:        Mood and Affect: Mood normal.        Behavior: Behavior normal.     ED Results / Procedures / Treatments   Labs (all labs ordered are listed, but only abnormal results are displayed) Labs Reviewed  LACTIC ACID, PLASMA - Abnormal; Notable for the following components:      Result Value   Lactic Acid, Venous 3.3 (*)    All other components within normal limits  LACTIC ACID, PLASMA - Abnormal; Notable for the following components:   Lactic Acid, Venous 3.4 (*)    All other components within normal limits  COMPREHENSIVE METABOLIC PANEL - Abnormal; Notable for the following components:   Potassium 3.1 (*)    Glucose, Bld 193 (*)    Creatinine, Ser 1.27 (*)    Calcium 7.7 (*)    Albumin 2.5 (*)    AST 124 (*)    ALT 54 (*)    Alkaline Phosphatase 625 (*)    Total Bilirubin 2.8 (*)    GFR, Estimated 55 (*)    All other components within normal limits  CBC WITH DIFFERENTIAL/PLATELET - Abnormal; Notable for the following components:   WBC 16.7 (*)    RBC 3.09 (*)    Hemoglobin 9.7 (*)    HCT 29.8 (*)    Neutro Abs 14.9 (*)    Lymphs Abs 0.6 (*)    Abs Immature Granulocytes 0.39 (*)    All other components within normal  limits  URINALYSIS, ROUTINE W REFLEX MICROSCOPIC - Abnormal; Notable for the following components:   Color, Urine AMBER (*)    Glucose, UA 50 (*)    Hgb urine  dipstick SMALL (*)    Protein, ur 100 (*)    Bacteria, UA RARE (*)    All other components within normal limits  LACTIC ACID, PLASMA - Abnormal; Notable for the following components:   Lactic Acid, Venous 2.5 (*)    All other components within normal limits  LACTIC ACID, PLASMA - Abnormal; Notable for the following components:   Lactic Acid, Venous 2.4 (*)    All other components within normal limits  CULTURE, BLOOD (ROUTINE X 2)  CULTURE, BLOOD (ROUTINE X 2)  RESP PANEL BY RT-PCR (FLU A&B, COVID) ARPGX2  MRSA NEXT GEN BY PCR, NASAL  URINE CULTURE  PROTIME-INR  MAGNESIUM  PROCALCITONIN  PROTIME-INR  CORTISOL-AM, BLOOD  COMPREHENSIVE METABOLIC PANEL  MAGNESIUM  CBC WITH DIFFERENTIAL/PLATELET  LACTIC ACID, PLASMA    EKG EKG Interpretation  Date/Time:  Wednesday June 03 2022 17:14:20 EDT Ventricular Rate:  100 PR Interval:  112 QRS Duration: 61 QT Interval:  415 QTC Calculation: 536 R Axis:   -11 Text Interpretation: Sinus tachycardia Multiple premature complexes, vent & supraven Borderline low voltage, extremity leads Abnormal R-wave progression, early transition Prolonged QT interval Confirmed by Godfrey Pick [phone]) on 06/03/2022 5:36:31 PM  Radiology CT ABDOMEN PELVIS W CONTRAST  Result Date: 06/03/2022 CLINICAL DATA:  Sepsis and increased weakness and fever. Colon and liver cancer. EXAM: CT ABDOMEN AND PELVIS WITH CONTRAST TECHNIQUE: Multidetector CT imaging of the abdomen and pelvis was performed using the standard protocol following bolus administration of intravenous contrast. RADIATION DOSE REDUCTION: This exam was performed according to the departmental dose-optimization program which includes automated exposure control, adjustment of the mA and/or kV according to patient size and/or use of iterative  reconstruction technique. CONTRAST:  31m OMNIPAQUE IOHEXOL 300 MG/ML  SOLN COMPARISON:  CT abdomen pelvis dated 04/09/2022. FINDINGS: Lower chest: Partially visualized small bilateral pleural effusions with associated partial compressive atelectasis of the lower lobes versus pneumonia. There is coronary vascular calcification. Mildly enlarged right cardiophrenic angle lymph node measures 10 mm in short axis. Several additional smaller and rounded lymph node noted at the right costophrenic angle anteriorly. No intra-abdominal free air.  Small ascites. Hepatobiliary: Innumerable hypoenhancing liver lesions most consistent with metastatic disease, new since the prior CT. There is biliary ductal dilatation similar to prior CT. The common bile duct stent is in place. No gallstone. Pancreas: Unremarkable. No pancreatic ductal dilatation or surrounding inflammatory changes. Spleen: Normal in size without focal abnormality. Adrenals/Urinary Tract: The adrenal glands unremarkable. There is no hydronephrosis on either side. Small bilateral renal cysts and subcentimeter hypodense lesions which are too small to characterize. There is symmetric enhancement and excretion of contrast by both kidneys. The visualized ureters and urinary bladder appear unremarkable. Stomach/Bowel: There is extensive sigmoid diverticulosis and diffuse colonic diverticula. No bowel obstruction. Vascular/Lymphatic: Moderate aortoiliac atherosclerotic disease. The IVC is unremarkable. No portal venous gas. Several top-normal retroperitoneal lymph nodes. Reproductive: The prostate and seminal vesicles are grossly unremarkable. Other: Diffuse subcutaneous edema and anasarca. Musculoskeletal: Osteopenia with degenerative changes of the spine. No acute osseous pathology. IMPRESSION: 1. Interval development of innumerable hepatic metastatic disease. 2. Common bile duct stent in place with stable biliary ductal dilatation. 3. Extensive colonic diverticulosis.  No bowel obstruction. 4. Small ascites and anasarca, increased since the prior CT. 5. Partially visualized small bilateral pleural effusions with associated partial compressive atelectasis of the lower lobes versus pneumonia. 6. Aortic Atherosclerosis (ICD10-I70.0). Electronically Signed   By: AAnner CreteM.D.   On: 06/03/2022 19:50  DG Chest Port 1 View  Result Date: 06/03/2022 CLINICAL DATA:  Question sepsis EXAM: PORTABLE CHEST 1 VIEW COMPARISON:  Chest 04/08/2022 FINDINGS: Heart size and vascularity normal. Negative for heart failure. Improved aeration of the bases with decrease in bibasilar airspace disease. Negative for pneumonia or effusion. Probable chronic lung disease with scarring in the bases. Chronic bilateral rib fractures. IMPRESSION: No acute abnormality. Electronically Signed   By: Franchot Gallo M.D.   On: 06/03/2022 17:20    Procedures Procedures    Medications Ordered in ED Medications  lactated ringers infusion ( Intravenous Infusion Verify 06/03/22 2325)  ceFEPIme (MAXIPIME) 2 g in sodium chloride 0.9 % 100 mL IVPB (has no administration in time range)  metroNIDAZOLE (FLAGYL) IVPB 500 mg (has no administration in time range)  traMADol (ULTRAM) tablet 50 mg (50 mg Oral Given 06/03/22 2212)  metoprolol succinate (TOPROL-XL) 24 hr tablet 37.5 mg (37.5 mg Oral Not Given 06/03/22 2215)  mirtazapine (REMERON) tablet 7.5 mg (7.5 mg Oral Given 06/03/22 2211)  pantoprazole (PROTONIX) EC tablet 40 mg (40 mg Oral Given 06/03/22 2211)  tamsulosin (FLOMAX) capsule 0.4 mg (has no administration in time range)  heparin injection 5,000 Units (5,000 Units Subcutaneous Given 06/03/22 2216)  acetaminophen (TYLENOL) tablet 650 mg (has no administration in time range)    Or  acetaminophen (TYLENOL) suppository 650 mg (has no administration in time range)  ondansetron (ZOFRAN) tablet 4 mg (has no administration in time range)    Or  ondansetron (ZOFRAN) injection 4 mg (has no administration  in time range)  polyethylene glycol (MIRALAX / GLYCOLAX) packet 17 g (has no administration in time range)  Chlorhexidine Gluconate Cloth 2 % PADS 6 each (has no administration in time range)  lactated ringers bolus 500 mL (0 mLs Intravenous Stopped 06/03/22 1800)  acetaminophen (TYLENOL) tablet 650 mg (650 mg Oral Given 06/03/22 1723)  lactated ringers bolus 1,000 mL (0 mLs Intravenous Stopped 06/03/22 1910)    And  lactated ringers bolus 250 mL (0 mLs Intravenous Stopped 06/03/22 1844)  ceFEPIme (MAXIPIME) 2 g in sodium chloride 0.9 % 100 mL IVPB (0 g Intravenous Stopped 06/03/22 1844)  metroNIDAZOLE (FLAGYL) IVPB 500 mg (0 mg Intravenous Stopped 06/03/22 1910)  iohexol (OMNIPAQUE) 300 MG/ML solution 100 mL (80 mLs Intravenous Contrast Given 06/03/22 1934)  potassium chloride (KLOR-CON) packet 40 mEq (40 mEq Oral Given 06/03/22 2214)    ED Course/ Medical Decision Making/ A&P                           Medical Decision Making Amount and/or Complexity of Data Reviewed Labs: ordered. Radiology: ordered. ECG/medicine tests: ordered.  Risk OTC drugs. Prescription drug management. Decision regarding hospitalization.   This patient presents to the ED for concern of fever and generalized weakness, this involves an extensive number of treatment options, and is a complaint that carries with it a high risk of complications and morbidity.  The differential diagnosis includes bacterial infection, sepsis, viral infection, dehydration, worsening of cancer   Co morbidities that complicate the patient evaluation  arthritis, GERD, HLD, HTN, pancreatic adenocarcinoma and cholangiocarcinoma   Additional history obtained:  Additional history obtained from patient's son External records from outside source obtained and reviewed including EMR   Lab Tests:  I Ordered, and personally interpreted labs.  The pertinent results include: Leukocytosis and lactic acidosis consistent with sepsis.  Hemoglobin  is baseline.  He does have slight increases in hepatobiliary enzymes since prior  lab work 2 weeks ago.   Imaging Studies ordered:  I ordered imaging studies including chest x-ray, CT of abdomen pelvis I independently visualized and interpreted imaging which showed worsening metastatic disease, CBD stent in place with stable biliary ductal dilatation, small volume ascites and anasarca, small bilateral pleural effusions with atelectasis of lower lobes versus possible pneumonia I agree with the radiologist interpretation   Cardiac Monitoring: / EKG:  The patient was maintained on a cardiac monitor.  I personally viewed and interpreted the cardiac monitored which showed an underlying rhythm of: Sinus rhythm   Consultations Obtained:  I requested consultation with the gastroenterologist, Dr. Rush Landmark,  and discussed lab and imaging findings as well as pertinent plan - they recommend: Medical management and reassessment to ensure patient is improving clinically.  They feel the elevation in hepatobiliary enzymes could likely be due to worsening metastatic spread, rather than biliary obstruction.  He does recommend consideration of transfer to [facility] for ERCP if patient does not improve clinically on medical management.   Problem List / ED Course / Critical interventions / Medication management  Patient is 88 year old male presenting from home for fever and generalized weakness.  Fever was first noticed today with a Tmax at home of 102 degrees.  He did have some decreased appetite over the past over days.  Patient's history includes cholangiocarcinoma which he has declined any treatment for.  He currently lives at home with his wife.  His son lives next door.  On arrival in the ED, patient is found to be febrile and tachycardic.  He is normotensive at this time.  His son reports that at baseline he is alert and oriented.  On arrival, he is oriented only to self.  Treatment for sepsis was  initiated with broad-spectrum antibiotics and 30 cc/kg of IV fluids.  Patient was given Tylenol for antipyresis.  Laboratory work-up was initiated.  Laboratory work-up was notable for leukocytosis, lactic acidosis, and mild elevation in his hepatobiliary enzymes.  On CT scan, his previously placed ABD duct stent is in place and he has stable biliary ductal dilatation.  He does have some ascites and pleural effusions.  Pleural effusions are associated with atelectasis versus lower lobe pneumonias.  This could be the source of infection.  On remaining lab work, there is no other clear source of infection.  He was hospitalized for similar symptoms in April and at that time, he was suspected to have cholangitis.  He did improve with medical therapy is only.  Today, on reassessment, patient also improved following IV fluids and initiation of antibiotics.  At this point, he is alert to place.  His son now accompanies him at bedside.  Patient and son were informed of work-up results.  Patient's son states that the patient is normally alert and oriented.  He is able to ambulate with a walker.  Patient confirms that he remains DNR/DNI.  Urinalysis did not show infection.  Source of infection remains unknown.  Given that there was concern of cholangitis on previous hospitalization, I did speak with gastroenterologist on-call, Dr. Rush Landmark, who recommends medical management with transfer to [facility] for consideration of ERCP as needed if patient does not improve clinically.  Patient was admitted to hospitalist for further management. I ordered medication including IV fluids and broad-spectrum antibiotic for treatment of sepsis; Tylenol for antipyresis Reevaluation of the patient after these medicines showed that the patient improved I have reviewed the patients home medicines and have made adjustments as needed  Social Determinants of Health:  Known metastatic cancer, declines declines aggressive  therapies.  CRITICAL CARE Performed by: Godfrey Pick   Total critical care time: 45 minutes  Critical care time was exclusive of separately billable procedures and treating other patients.  Critical care was necessary to treat or prevent imminent or life-threatening deterioration.  Critical care was time spent personally by me on the following activities: development of treatment plan with patient and/or surrogate as well as nursing, discussions with consultants, evaluation of patient's response to treatment, examination of patient, obtaining history from patient or surrogate, ordering and performing treatments and interventions, ordering and review of laboratory studies, ordering and review of radiographic studies, pulse oximetry and re-evaluation of patient's condition.         Final Clinical Impression(s) / ED Diagnoses Final diagnoses:  Sepsis with encephalopathy without septic shock, due to unspecified organism [facility])    Rx / DC Orders ED Discharge Orders     None         Godfrey Pick, MD 06/04/22 0134

## 2022-06-03 NOTE — Sepsis Progress Note (Signed)
Notified provider of need to order repeat lactic acid. ° °

## 2022-06-03 NOTE — Progress Notes (Signed)
Pharmacy Antibiotic Note  Terry Hernandez is a 88 y.o. male admitted on 06/03/2022 with  infection of unknown origin. Lactic acid upon presentation is 3.3 mmol/L (06/03/2022 1709)  . Pharmacy has been consulted for vancomycin and cefepime dosing.   Patient does have a documented allergy to keflex with a reaction of rash. When looking through the EMR, I noted that he received CTX in 11/2021 with no documented allergic reaction. Given the severity of his illness upon presenting to the ED and low severity of reaction to keflex, I am comfortable with use of cephalosporins other than the first generations.   Plan: Cefepime 2 grams iv q12h Vancomycin 1000 IV every 24 hours.  Goal AUC 400-600 mcg*hr/mL Scr utilized: 1.27 mg/dL eAUC: 494 mcg*hr/mL T [1/2] 17.4 hours Ke: 0.040 hr-1   Weight: 70.8 kg (156 lb)  Temp (24hrs), Avg:100.2 F (37.9 C), Min:98.7 F (37.1 C), Max:102.5 F (39.2 C)  Recent Labs  Lab 06/03/22 1709  WBC 16.7*  CREATININE 1.27*  LATICACIDVEN 3.3*    Estimated Creatinine Clearance: 42.6 mL/min (A) (by C-G formula based on SCr of 1.27 mg/dL (H)).    Allergies  Allergen Reactions   Cephalexin Rash    Patient has a history that includes tolerating 3rd gen cephalosporin [CTX] 11/2021    Antimicrobials this admission: 6/21 cefepime >>  6/21 vanco >>  6/21 metronidazole >> 6/21  Dose adjustments this admission: Cefepime was adjusted for CRCL 42.6 ml/min (q8h >> q12h)  Microbiology results: 6/21 BCx: in process 6/21 UCx: awaiting collection at the time of this note   Thank you for allowing pharmacy to be a part of this patient's care.  Vaughan Basta BS, PharmD, BCPS Clinical Pharmacist 06/03/2022 6:26 PM  Contact: [phone] after 3 PM  "Be curious, not judgmental..." -Jamal Maes

## 2022-06-03 NOTE — Sepsis Progress Note (Signed)
eLink is following this Code Sepsis. °

## 2022-06-03 NOTE — Assessment & Plan Note (Addendum)
--   Chronic paroxysmal atrial fibrillation -Currently in sinus rhythm -Continue metoprolol -not chronically anticoagulated

## 2022-06-03 NOTE — ED Notes (Signed)
Date and time results received: 06/03/22 1750   Test: Lactic Acid Critical Value: 3.3  Name of Provider Notified: EDP  Orders Received? Or Actions Taken?: Notified

## 2022-06-03 NOTE — Assessment & Plan Note (Signed)
-  Albumin 2.5 -2/2 poor PO intake -Encourage nutrient dense food choices -Continue to monitor

## 2022-06-03 NOTE — ED Notes (Signed)
Pt requested tramadol. Pt states normally takes this medication at bedtime

## 2022-06-03 NOTE — Progress Notes (Signed)
Called by [facility] emergency department and spoke briefly with provider taking care of this patient.  However, we lost communication and he is no longer available for discussion on the phone. Patient with known metastatic cholangiocarcinoma and prior biliary stenting who comes in with abdominal pain and with concern for cholangitis with elevated white blood cell count and elevated T. bili.  Patient recently had an admission within the last 2 months for similar presentation at [facility] and was treated just with antibiotics and tolerated and improved. [facility] no longer has ERCP capabilities. At this point, it is not clear if the patient will or will not need an ERCP. With that being said, if the patient were to have clinical deterioration, ERCP evaluation of the biliary stent may be required to rule out overt sludge formation/stent bile debris.  The other potential reason for patient's elevated LFTs could be his now looking progressive metastatic disease of the liver. The emergency department team at [facility] will have to make a decision as to what they feel comfortable doing and what the medicine service will feel comfortable taking care of. If the patient tells them that he is adamant he would not want any procedures performed, then decision has been made and patient may best be treated with antibiotics at the current center. If however he is open to the consideration of a possible ERCP, and if he does not stabilize just with antibiotics, then being at a center that has ERCP capability is reasonable. The patient has a new GI doctor of [place] and thus he will be considered an unassigned patient for GI consultation services in [place].  If the [facility] emergency department and medicine services decide to transfer the patient to [place] then the following would be at play: 1) [facility] transfer would lead to [place] service evaluation 2) [facility] transfer would lead  to [facility] service evaluation 3) which ever hospital he gets transferred to, if he is tender answered to, then just please update the appropriate consultative service 4) if ERCP is considered necessary then availability does not look to be promising until the afternoon or potentially Friday but that would have to be determined by the hospital service at that time  5) these are solely recommendations without having seen the patient and final discretion always is with the patient's providers were being able to see and evaluate the patient at the time 6) if patient is admitted to [facility], then formal consultation with [facility], GI service should be pursued   Justice Britain, MD [facility] Advanced [facility]oscopy Office # [phone]

## 2022-06-03 NOTE — Assessment & Plan Note (Addendum)
-  Sepsis due to cholangitis  -Vague abdominal pain, fever, transaminitis and elevated t bili -similar presentation in April - improved with antibiotics and did not require ERCP -Apparently was discussed discussed with GI overnight at admission-  Comfortable with treating the patient at [place] monitoring if needing ERCP will be transferred to [place] duct stent in appropriate positioning

## 2022-06-03 NOTE — ED Notes (Signed)
Patient transported to CT 

## 2022-06-03 NOTE — Assessment & Plan Note (Addendum)
BP (!) 121/55, pulse 80, temp97.7 F (36.5 C), RR 20, SpO2 98 %. -Lactic acid 2.5, 2.4 >> 0.7 -Procalcitonin 4.52, WBC 16.7, 16.7 -Improved sepsis physiology  -On admission febrile, tachycardic, tachypnea, leukocytosis, and lactic acidosis with transaminitis -1.75L Bolus in ED -Reduce IV infusion rate after bolus 2/2 pleural effusions, small ascites, and anasarca on CT  -Blood cultures >>> are growing gram-negative rods  -Continue cefepime and flagyl - most likely source is cholangitis -UA -within normal limits -CXR shows no acute abnormality -We will continue to monitor on stepdown unit for sepsis

## 2022-06-03 NOTE — H&P (Signed)
History and Physical    Patient: Terry Hernandez NOM:[id number] DOB: [birth year] DOA: 06/03/2022 DOS: the patient was seen and examined on 06/03/2022 PCP: [name], FNP  Patient coming from: Home  Chief Complaint:  Chief Complaint  Patient presents with   Fever   HPI: Terry Hernandez is a 88 y.o. male with medical history significant of  GERD, hyperlipidemia, hypertension, biliary adenocarcinoma, and more presents ED with a chief complaint of fever. Patient is quite stoic, and does not give much history as a result. His son, at bedside, reports that patient had a fever up to 102 at home. He also had significant generalized weakness. The patient lives with his wife, and son lives nearby. When son was notified that patient had a fever, he went to check on him, and found the patient to be too weak for the son to be able to help him get out of bed. They had to call EMS for this reason. Patient does have a history of physical deconditioning, but can usually get up with a walker or with crutches and get himself around the home. Son reports that patient was at his baseline strength yesterday, so this came on acutely. Patient denies any associated cough, dyspnea, diarrhea, dysuria or urinary frequency. Patient also denies nausea, but the son reports that patient did have nausea and vomiting this AM. Patient seems to think that it was because he did not like the food he was eating at that time. Son goes on to report that the patient has had a decreased appetite for 3-4 days. Wife had reported to son that patient complained of abdominal pain as well. Initially patient denies abdominal pain, but then when confronted by son about it, he said "not enough to mention." He does not provide details about this pain because "it did not bother him much." Patient also complains of some skin irritation on his back side. He reports that wife has been putting a cream on it. There was no drainage, or skin breakdown to his  knowledge. He does sit for long periods of time, and believes the irritation to be due to that. He has not had increased pain in the area, or spreading of the irritation. Patient has no further complaints at this time.   Patient does not drink, does not smoke, and does not use illicit drugs. He is vaxed for covid. Patient is DNR.  Review of Systems: As mentioned in the history of present illness. All other systems reviewed and are negative. Past Medical History:  Diagnosis Date   Arthritis    Cancer ([place])    Colon cancer ([place])    GERD (gastroesophageal reflux disease)    PMH   Glucose intolerance (impaired glucose tolerance)    High cholesterol    History of kidney stones    HTN (hypertension)    Hyperlipidemia    Liver cancer ([place])    Lumbar stenosis with neurogenic claudication    Pneumonia    Subcutaneous mass    Wears glasses    Past Surgical History:  Procedure Laterality Date   APPENDECTOMY     BILIARY BRUSHING  10/26/2021   Procedure: BILIARY BRUSHING;  Surgeon: Clarene Essex, MD;  Location: [facility] ENDOSCOPY;  Service: Endoscopy;;   BILIARY STENT PLACEMENT  10/26/2021   Procedure: BILIARY STENT PLACEMENT;  Surgeon: Clarene Essex, MD;  Location: [place];  Service: Endoscopy;;   BILIARY STENT PLACEMENT  10/30/2021   Procedure: BILIARY STENT PLACEMENT;  Surgeon: Clarene Essex, MD;  Location: [facility] ENDOSCOPY;  Service: Endoscopy;;   COLONOSCOPY W/ BIOPSIES AND POLYPECTOMY     ERCP N/A 10/26/2021   Procedure: ENDOSCOPIC RETROGRADE CHOLANGIOPANCREATOGRAPHY (ERCP);  Surgeon: Clarene Essex, MD;  Location: [place];  Service: Endoscopy;  Laterality: N/A;   ERCP N/A 10/30/2021   Procedure: ENDOSCOPIC RETROGRADE CHOLANGIOPANCREATOGRAPHY (ERCP);  Surgeon: Clarene Essex, MD;  Location: [place];  Service: Endoscopy;  Laterality: N/A;   ESOPHAGOGASTRODUODENOSCOPY N/A 10/26/2021   Procedure: ESOPHAGOGASTRODUODENOSCOPY (EGD);  Surgeon: Clarene Essex, MD;  Location: [place];  Service:  Endoscopy;  Laterality: N/A;   LUMBAR LAMINECTOMY/DECOMPRESSION MICRODISCECTOMY Bilateral 03/18/2020   Procedure: Bilateral Lumbar three-four Lumbar four-five Laminotomy/foraminotomy;  Surgeon: Kristeen Miss, MD;  Location: [place];  Service: Neurosurgery;  Laterality: Bilateral;   MASS EXCISION Right 03/18/2020   Procedure: Excision of a Right gluteal subcutaneous mass;  Surgeon: Kristeen Miss, MD;  Location: [place];  Service: Neurosurgery;  Laterality: Right;   MULTIPLE TOOTH EXTRACTIONS     SPHINCTEROTOMY  10/26/2021   Procedure: SPHINCTEROTOMY;  Surgeon: Clarene Essex, MD;  Location: [facility] ENDOSCOPY;  Service: Endoscopy;;   STENT REMOVAL  10/30/2021   Procedure: STENT REMOVAL;  Surgeon: Clarene Essex, MD;  Location: [place];  Service: Endoscopy;;   Social History:  reports that he has never smoked. He has never used smokeless tobacco. He reports that he does not drink alcohol and does not use drugs.  Allergies  Allergen Reactions   Cephalexin Rash    Patient has a history that includes tolerating 3rd gen cephalosporin [CTX] 11/2021    Family History  Problem Relation Age of Onset   Cancer Mother    Stroke Father    Cancer Son        lung    Prior to Admission medications   Medication Sig Start Date End Date Taking? Authorizing Provider  acetaminophen (TYLENOL) 325 MG tablet Take 650 mg by mouth every 6 (six) hours as needed for mild pain or fever.   Yes [provider]  ferrous sulfate 325 (65 FE) MG EC tablet Take 325 mg by mouth daily with breakfast.   Yes [provider]  methocarbamol (ROBAXIN) 500 MG tablet Take 1 tablet (500 mg total) by mouth every 6 (six) hours as needed for muscle spasms. 03/19/20  Yes Kristeen Miss, MD  metoprolol succinate (TOPROL-XL) 25 MG 24 hr tablet Take 1.5 tablets (37.5 mg total) by mouth daily. 01/14/22  Yes Buford Dresser, MD  mirtazapine (REMERON) 15 MG tablet TAKE 1/2 TABLET BY MOUTH AT BEDTIME 03/18/22  Yes [name], FNP   Multiple Vitamin (MULTIVITAMIN) tablet Take 1 tablet by mouth at bedtime.    Yes [provider]  pantoprazole (PROTONIX) 40 MG tablet Take 1 tablet (40 mg total) by mouth daily. 10/29/21 06/03/22 Yes Aline August, MD  senna-docusate (SENOKOT-S) 8.6-50 MG tablet Take 2 tablets by mouth at bedtime. Patient taking differently: Take 2 tablets by mouth at bedtime as needed for mild constipation. 11/17/21 11/17/22 Yes Roxan Hockey, MD  silodosin (RAPAFLO) 4 MG CAPS capsule Take 1 capsule (4 mg total) by mouth daily with breakfast. 05/21/22  Yes Irine Seal, MD  traMADol (ULTRAM) 50 MG tablet Take 50 mg by mouth every 6 (six) hours as needed for moderate pain. 01/14/22  Yes [provider]  feeding supplement (ENSURE ENLIVE / ENSURE PLUS) LIQD Take 237 mLs by mouth 3 (three) times daily between meals. 11/17/21   Roxan Hockey, MD    Physical Exam: Vitals:   06/03/22 1930 06/03/22 2000  06/03/22 2030 06/03/22 2100  BP: (!) 106/56 (!) 102/56 (!) 100/58 (!) 94/58  Pulse: 88 85 84 81  Resp: (!) 23 (!) 23 20 (!) 23  Temp:      TempSrc:      SpO2: 92% 96% 96% 95%  Weight:       1.  General: Patient lying supine in bed,  no acute distress   2. Psychiatric: Alert and oriented x 3, mood and behavior normal for situation, pleasant and cooperative with exam   3. Neurologic: Speech and language are normal, face is symmetric, moves all 4 extremities voluntarily, at baseline without acute deficits on limited exam   4. HEENMT:  Head is atraumatic, normocephalic, pupils reactive to light, neck is supple, trachea is midline, mucous membranes are moist   5. Respiratory : Lungs are clear to auscultation bilaterally without wheezing, rhonchi, rales, no cyanosis, no increase in work of breathing or accessory muscle use   6. Cardiovascular : Heart rate normal, rhythm is regular, no murmurs, rubs or gallops, peripheral edema present, peripheral pulses palpated   7. Gastrointestinal:   Abdomen is soft, nondistended, nontender to palpation, bowel sounds active, no masses palpated   8. Skin:  Blanchable erythema over intergluteal cleft and sacrum   9.Musculoskeletal:  No acute deformities or trauma, no asymmetry in tone, peripheral edema present, peripheral pulses palpated, no tenderness to palpation in the extremities  Data Reviewed: In the ED Temp 98.7-102.5, heart rate 85-105, respiratory rate 20-30, blood pressure 102/56, satting at 96% Leukocytosis of 16.7, hemoglobin 9.7, platelets 334 Hypokalemia at 3.1 Magnesium 1.9 Creatinine at baseline 1.27 CT abdomen shows innumerable hepatic mets, common bile duct stent in appropriate position, small ascites and anasarca, bilateral pleural effusions Chest x-ray shows no acute abnormality EKG shows sinus tach with a heart rate of 100, QTc 536 Negative COVID Transaminitis showing AST 124, ALT 54, T. bili 2.4 Lactic acidosis with lactic acid 3.3, 3.4 Admission requested for further management of sepsis secondary to cholangitis  Patient recently admitted for the same in April of this year.  At that time he was treated with IV Zosyn for presumptive cholangitis.  Patient had clinical improvement within 48 hours.  GI was consulted and recommended MRCP which showed common bile duct stent in proper position with mild biliary dilation some nonspecific findings could be cholangitis or possibly just due to his metastatic disease.  Patient's transaminitis and leukocytosis improved throughout the hospitalization.  At that time he opted against aggressive treatment for cancer.  He decided to be DNR at that time, and his been consistent with this decision.  He was discharged in stable condition with a prescription for Augmentin to finish his antibiotic course.  Patient also had a hospitalization in November of last year at which he had jaundice and was diagnosed with biliary adenocarcinoma.  He had common bile duct stricture at that time and  biliary stent was placed.   Assessment and Plan: * Sepsis ([place]) -Febrile, tachycardic, tachypnea, leukocytosis, and lactic acidosis with transaminitis -1.75L Bolus in ED -Reduce IV infusion rate after bolus 2/2 pleural effusions, small ascites, and anasarca on CT -Vanc, cefepime, flagyl in ED -Continue cefepime and flagyl - most likely source is cholangitis -UA pending -CXR shows no acute abnormality -blanchable Skin irritation over sacrum, does not clinically appear to be cellulitis -procal pending -Trend lactic acid in the AM --> anticipate slow clearing of lactic acid 2/2 hepatic pathology -Admit to stepdown - pressure remains soft, low threshold for pressors  for hypotension, given that he is high risk for fluid overload --> avoiding repeat fluid boluses   Hypokalemia -K+ 3.1 -2/2 Poor PO intake -Replace -Recheck in the AM along with mag, mag today 1.9 -Continue to monitior  Cholangitis -Vague abdominal pain, fever, transaminitis and elevated t bili -similar presentation in April - improved with antibiotics and did not require ERCP -Discussed with GI - admit here, treat with medical management. If he should need ERCP he will need to be xferred to [facility] -Biliary duct stent in appropriate positioning -See plan for sepsis   Malnutrition of moderate degree -Albumin 2.5 -2/2 poor PO intake -Encourage nutrient dense food choices -Continue to monitor  Transaminitis AST 46--> 124 ALT 24--> 54 T bili 0.5 --> 2.8 Known cholangiocarcinoma, biliary stent placed in 10/2021, and now with new liver mets. Presents with fever, vague abdominal pain -> most likely cholangitis See plan for sepsis Continue to monitor with CMP in the AM Reduce IV hydration rate in the setting of pleural effusions, ascites, and anasarca on CT   Generalized weakness -2/2 deconditioning and acute illness -PT eval and treat -Treat as per sepsis -Continue to monitor  Atrial fibrillation  (HCC) -PAF -Continue metoprolol -ST on EKG today -Monitor on tele  GERD (gastroesophageal reflux disease) Continue protonix  Hyperbilirubinemia T bili up to 2.8 from, 0.5 2/2 cholangitis Continue treatment for cholangitis, and continue to monitor on CMP in the AM      Advance Care Planning:   Code Status: DNR   Consults: GI  Family Communication: Son at bedside-  Remo Lipps  Severity of Illness: The appropriate patient status for this patient is INPATIENT. Inpatient status is judged to be reasonable and necessary in order to provide the required intensity of service to ensure the patient's safety. The patient's presenting symptoms, physical exam findings, and initial radiographic and laboratory data in the context of their chronic comorbidities is felt to place them at high risk for further clinical deterioration. Furthermore, it is not anticipated that the patient will be medically stable for discharge from the hospital within 2 midnights of admission.   * I certify that at the point of admission it is my clinical judgment that the patient will require inpatient hospital care spanning beyond 2 midnights from the point of admission due to high intensity of service, high risk for further deterioration and high frequency of surveillance required.*  Author: Rolla Plate, DO 06/03/2022 9:05 PM  For on call review [email or web address].

## 2022-06-03 NOTE — Assessment & Plan Note (Addendum)
-  2/2 deconditioning and acute illness -PT eval and treat -Treating underlying contributing factors including current sepsis -Continue to monitor

## 2022-06-03 NOTE — Assessment & Plan Note (Addendum)
Latest Ref Rng & Units 06/04/2022    3:47 AM 06/03/2022    5:09 PM 05/21/2022    1:02 PM  Hepatic Function  Total Protein 6.5 - 8.1 g/dL 6.1  6.9  7.4   Albumin 3.5 - 5.0 g/dL 2.1  2.5  3.5   AST 15 - 41 U/L 78  124  46   ALT 0 - 44 U/L 44  54  24   Alk Phosphatase 38 - 126 U/L 510  625  502   Total Bilirubin 0.3 - 1.2 mg/dL 2.9  2.8  0.5    Known cholangiocarcinoma, biliary stent placed in 10/2021, and now with new liver mets. Presents with fever, vague abdominal pain -> most likely cholangitis Continue treating sepsis per protocol -Aggressive IV fluid hydration (monitoring for pleural effusion, ascites and anasarca is present per CT) -Continue broad-spectrum antibiotics

## 2022-06-03 NOTE — Assessment & Plan Note (Addendum)
Continue protonix -Stable

## 2022-06-03 NOTE — ED Triage Notes (Signed)
Pt a/o from [facility]. C/o fever since this am. Current hx of liver and colon cancer and has opted for no treatment. Some Gen weakness noted. Poor ambulation with ems.  245cbg,has had 52m NS. 473moin pta at 339pm

## 2022-06-04 ENCOUNTER — Encounter (HOSPITAL_COMMUNITY): Payer: Self-pay | Admitting: Family Medicine

## 2022-06-04 DIAGNOSIS — Z7189 Other specified counseling: Secondary | ICD-10-CM

## 2022-06-04 DIAGNOSIS — Z515 Encounter for palliative care: Secondary | ICD-10-CM

## 2022-06-04 DIAGNOSIS — K8309 Other cholangitis: Secondary | ICD-10-CM | POA: Diagnosis not present

## 2022-06-04 DIAGNOSIS — I480 Paroxysmal atrial fibrillation: Secondary | ICD-10-CM | POA: Diagnosis not present

## 2022-06-04 DIAGNOSIS — A419 Sepsis, unspecified organism: Secondary | ICD-10-CM | POA: Diagnosis not present

## 2022-06-04 DIAGNOSIS — K219 Gastro-esophageal reflux disease without esophagitis: Secondary | ICD-10-CM | POA: Diagnosis not present

## 2022-06-04 LAB — CBC WITH DIFFERENTIAL/PLATELET
Abs Immature Granulocytes: 0.11 10*3/uL — ABNORMAL HIGH (ref 0.00–0.07)
Basophils Absolute: 0.1 10*3/uL (ref 0.0–0.1)
Basophils Relative: 0 %
Eosinophils Absolute: 0 10*3/uL (ref 0.0–0.5)
Eosinophils Relative: 0 %
HCT: 28.7 % — ABNORMAL LOW (ref 39.0–52.0)
Hemoglobin: 9.1 g/dL — ABNORMAL LOW (ref 13.0–17.0)
Immature Granulocytes: 1 %
Lymphocytes Relative: 11 %
Lymphs Abs: 1.8 10*3/uL (ref 0.7–4.0)
MCH: 31.5 pg (ref 26.0–34.0)
MCHC: 31.7 g/dL (ref 30.0–36.0)
MCV: 99.3 fL (ref 80.0–100.0)
Monocytes Absolute: 0.6 10*3/uL (ref 0.1–1.0)
Monocytes Relative: 4 %
Neutro Abs: 14.1 10*3/uL — ABNORMAL HIGH (ref 1.7–7.7)
Neutrophils Relative %: 84 %
Platelets: 265 10*3/uL (ref 150–400)
RBC: 2.89 MIL/uL — ABNORMAL LOW (ref 4.22–5.81)
RDW: 15.7 % — ABNORMAL HIGH (ref 11.5–15.5)
WBC: 16.7 10*3/uL — ABNORMAL HIGH (ref 4.0–10.5)
nRBC: 0 % (ref 0.0–0.2)

## 2022-06-04 LAB — COMPREHENSIVE METABOLIC PANEL
ALT: 44 U/L (ref 0–44)
AST: 78 U/L — ABNORMAL HIGH (ref 15–41)
Albumin: 2.1 g/dL — ABNORMAL LOW (ref 3.5–5.0)
Alkaline Phosphatase: 510 U/L — ABNORMAL HIGH (ref 38–126)
Anion gap: 5 (ref 5–15)
BUN: 24 mg/dL — ABNORMAL HIGH (ref 8–23)
CO2: 26 mmol/L (ref 22–32)
Calcium: 7.8 mg/dL — ABNORMAL LOW (ref 8.9–10.3)
Chloride: 108 mmol/L (ref 98–111)
Creatinine, Ser: 1.27 mg/dL — ABNORMAL HIGH (ref 0.61–1.24)
GFR, Estimated: 55 mL/min — ABNORMAL LOW (ref >60)
Glucose, Bld: 132 mg/dL — ABNORMAL HIGH (ref 70–99)
Potassium: 4.6 mmol/L (ref 3.5–5.1)
Sodium: 139 mmol/L (ref 135–145)
Total Bilirubin: 2.9 mg/dL — ABNORMAL HIGH (ref 0.3–1.2)
Total Protein: 6.1 g/dL — ABNORMAL LOW (ref 6.5–8.1)

## 2022-06-04 LAB — BLOOD CULTURE ID PANEL (REFLEXED) - BCID2

## 2022-06-04 LAB — CORTISOL-AM, BLOOD: Cortisol - AM: 14.8 ug/dL (ref 6.7–22.6)

## 2022-06-04 LAB — LACTIC ACID, PLASMA
Lactic Acid, Venous: 2.4 mmol/L (ref 0.5–1.9)
Lactic Acid, Venous: 1.7 mmol/L (ref 0.5–1.9)

## 2022-06-04 LAB — BILIRUBIN, DIRECT: Bilirubin, Direct: 1.8 mg/dL — ABNORMAL HIGH (ref 0.0–0.2)

## 2022-06-04 LAB — PROTIME-INR
INR: 1.2 (ref 0.8–1.2)
Prothrombin Time: 15.4 seconds — ABNORMAL HIGH (ref 11.4–15.2)

## 2022-06-04 LAB — MAGNESIUM: Magnesium: 1.6 mg/dL — ABNORMAL LOW (ref 1.7–2.4)

## 2022-06-04 LAB — MRSA NEXT GEN BY PCR, NASAL: MRSA by PCR Next Gen: NOT DETECTED

## 2022-06-04 MED ORDER — MAGNESIUM SULFATE 2 GM/50ML IV SOLN
2.0000 g | Freq: Once | INTRAVENOUS | Status: AC
Start: 2022-06-04 — End: 2022-06-04
  Administered 2022-06-04: 2 g via INTRAVENOUS
  Filled 2022-06-04: qty 50

## 2022-06-04 MED ORDER — FLORANEX PO PACK
1.0000 g | PACK | Freq: Three times a day (TID) | ORAL | Status: DC
Start: 2022-06-04 — End: 2022-06-08
  Administered 2022-06-04 – 2022-06-08 (×6): 1 g via ORAL
  Filled 2022-06-04 (×19): qty 1

## 2022-06-04 MED ORDER — IBUPROFEN 400 MG PO TABS: 600.0000 mg | ORAL_TABLET | Freq: Four times a day (QID) | ORAL | Status: DC | PRN

## 2022-06-04 MED ORDER — SODIUM CHLORIDE 0.9 % IV SOLN
2.0000 g | INTRAVENOUS | Status: DC
  Administered 2022-06-05 – 2022-06-08 (×4): 2 g via INTRAVENOUS
  Filled 2022-06-04 (×4): qty 20

## 2022-06-04 MED ORDER — LACTATED RINGERS IV SOLN: INTRAVENOUS | Status: DC

## 2022-06-04 MED ORDER — ENSURE ENLIVE PO LIQD
237.0000 mL | Freq: Two times a day (BID) | ORAL | Status: DC
Start: 2022-06-04 — End: 2022-06-08
  Administered 2022-06-04 – 2022-06-08 (×6): 237 mL via ORAL

## 2022-06-04 MED ORDER — SODIUM CHLORIDE 0.9 % IV SOLN
2.0000 g | Freq: Two times a day (BID) | INTRAVENOUS | Status: AC
  Administered 2022-06-04: 2 g via INTRAVENOUS
  Filled 2022-06-04: qty 12.5

## 2022-06-04 NOTE — Hospital Course (Addendum)
Terry Hernandez is a 88 y.o. male with medical history significant of  GERD, hyperlipidemia, hypertension, biliary adenocarcinoma, and more presents ED with a chief complaint of fever. Patient is quite stoic, and does not give much history as a result. His son, at bedside, reports that patient had a fever up to 102 at home. He also had significant generalized weakness. The patient lives with his wife, and son lives nearby. When son was notified that patient had a fever, he went to check on him, and found the patient to be too weak for the son to be able to help him get out of bed. They had to call EMS for this reason. Patient does have a history of physical deconditioning, but can usually get up with a walker or with crutches and get himself around the home. Son reports that patient was at his baseline strength yesterday, so this came on acutely. P  Son goes on to report that the patient has had a decreased appetite for 3-4 days. Patient complained of abdominal pain as well.   ED Vitals : Temp 98.7-102.5, HR 85-105, RR 20-30, BP 102/56, satting at 96% Leukocytosis of 16.7, hemoglobin 9.7, platelets 334 Hypokalemia at 3.1 Magnesium 1.9 Creatinine at baseline 1.27 CT abdomen shows innumerable hepatic mets, common bile duct stent in appropriate position, small ascites and anasarca, bilateral pleural effusions Chest x-ray shows no acute abnormality EKG shows sinus tach with a heart rate of 100, QTc 536 Negative COVID Transaminitis showing AST 124, ALT 54, T. bili 2.4 Lactic acidosis with lactic acid 3.3, 3.4 Admission requested for further management of sepsis secondary to cholangitis   Patient recently admitted for the same in April of this year.  At that time he was treated with IV Zosyn for presumptive cholangitis.  Patient had clinical improvement within 48 hours.  GI was consulted and recommended MRCP which showed common bile duct stent in proper position with mild biliary dilation some nonspecific  findings could be cholangitis or possibly just due to his metastatic disease.  Patient's transaminitis and leukocytosis improved throughout the hospitalization.   At that time he opted against aggressive treatment for cancer.  He decided to be DNR at that time, and his been consistent with this decision.  He was discharged in stable condition with a prescription for Augmentin to finish his antibiotic course.   Patient also had a hospitalization in November of last year at which he had jaundice and was diagnosed with biliary adenocarcinoma.  He had common bile duct stricture at that time and biliary stent was placed.

## 2022-06-04 NOTE — Progress Notes (Signed)
Initial Nutrition Assessment  DOCUMENTATION CODES:   Severe malnutrition in context of chronic illness  INTERVENTION:  Encourage adequate PO intake Ensure Enlive po BID, each supplement provides 350 kcal and 20 grams of protein.  NUTRITION DIAGNOSIS:   Severe Malnutrition related to chronic illness (biliary adenocarcinoma) as evidenced by percent weight loss, moderate fat depletion, moderate muscle depletion, severe muscle depletion.  GOAL:   Patient will meet greater than or equal to 90% of their needs  MONITOR:   PO intake, Supplement acceptance, Labs, Weight trends  REASON FOR ASSESSMENT:   Malnutrition Screening Tool    ASSESSMENT:   Pt admitted with sepsis d/t cholangitis. PMH significant for GERD, HLD, HTN, and biliary adenocarcinoma s/p biliary stent placement 10/2021.  Pt sitting in chair with son at bedside. He reports that for the past couple days he has not been eating as much as usual. Prior to the past few days, he was eating well. He recalls eating 2 "good" meals daily. He has a Consulting civil engineer breakfast bowl or biscuit for breakfast, something small-such as a sandwich for lunch and then a big dinner usually provided by his son and may include a cheeseburger or something that he feels up to eating. He also reports occasionally drinking a Boost and is agreeable to receive during admission. His son also reports bringing peanut butter crackers for pt during admission as he has been asking for these. Encouraged him to bring pt foods that he may enjoy.   His mobility has been at baseline and uses a walker for assistance.   Pt's son states that at the beginning of the year his wt was ~130 lbs and since then he has gained wt and endorses a current wt ~157 lbs. Reviewed wt hx. Pt's wt noted to increase from 61.7-71.3 kg between 01/17-05/18, however it appears as though he has had a wt loss of 7% within the last month which is significant for time frame.   Edema: moderate pitting  BLE  Medications: lactobacillus, remeron, protonix IV drips: abx, Mg sulfate  Labs: BUN 24, Cr 1.27, Mg 1.6, alkaline phosphatase 510, AST 78, total bilirubin 2.9, GFR 55  NUTRITION - FOCUSED PHYSICAL EXAM:  Flowsheet Row Most Recent Value  Orbital Region Moderate depletion  Upper Arm Region Severe depletion  Thoracic and Lumbar Region Moderate depletion  Buccal Region Mild depletion  Temple Region Moderate depletion  Clavicle Bone Region Severe depletion  Clavicle and Acromion Bone Region Moderate depletion  Scapular Bone Region Moderate depletion  Dorsal Hand Severe depletion  [pt reports numbness in first 3 fingers of both hands]  Patellar Region Severe depletion  Anterior Thigh Region Severe depletion  Posterior Calf Region Severe depletion  Edema (RD Assessment) None  Hair Reviewed  Eyes Reviewed  Mouth Reviewed  Skin Reviewed  Nails Reviewed       Diet Order:   Diet Order             Diet regular Room service appropriate? Yes; Fluid consistency: Thin  Diet effective now                   EDUCATION NEEDS:   No education needs have been identified at this time  Skin:  Skin Assessment: Reviewed RN Assessment  Last BM:  PTA  Height:   Ht Readings from Last 1 Encounters:  06/04/22 6' (1.829 m)    Weight:   Wt Readings from Last 1 Encounters:  06/04/22 66.3 kg   BMI:  Body mass  index is 19.82 kg/m.  Estimated Nutritional Needs:   Kcal:  1900-2100  Protein:  95-110g  Fluid:  >/=1.9L  Clayborne Dana, RDN, LDN Clinical Nutrition

## 2022-06-04 NOTE — Evaluation (Signed)
Physical Therapy Evaluation Patient Details Name: KARION CUDD MRN: [id number] DOB: 1934-04-16 Today's Date: 06/04/2022  History of Present Illness  Terry Hernandez is a 88 y.o. male with medical history significant of  GERD, hyperlipidemia, hypertension, biliary adenocarcinoma, and more presents ED with a chief complaint of fever. Patient is quite stoic, and does not give much history as a result. His son, at bedside, reports that patient had a fever up to 102 at home. He also had significant generalized weakness. The patient lives with his wife, and son lives nearby. When son was notified that patient had a fever, he went to check on him, and found the patient to be too weak for the son to be able to help him get out of bed. They had to call EMS for this reason. Patient does have a history of physical deconditioning, but can usually get up with a walker or with crutches and get himself around the home. Son reports that patient was at his baseline strength yesterday, so this came on acutely.   Clinical Impression  Patient supine in bed and son present upon therapist arrival. Nursing reports patient recently transferred from chair back to bed after sitting for awhile. Patient agreeable to participating in PT evaluation today. Patient performs bed mobility and transfers well with min guard to min assist exhibiting decreased trunk and lower extremity strength for these activities and requires cues for hand placement and step sequencing with use of RW. Patient ambulated 100 feet on room air using RW with a slow, labored cadence, a narrow base of support, trunk flexed and cues to walk within base of support.  Patient would continue to benefit from skilled physical therapy in current environment and next venue to continue return to prior function and increase strength, endurance, balance, coordination, and functional mobility and gait skills.       Recommendations for follow up therapy are one component of a  multi-disciplinary discharge planning process, led by the attending physician.  Recommendations may be updated based on patient status, additional functional criteria and insurance authorization.  Follow Up Recommendations Home health PT      Assistance Recommended at Discharge Set up Supervision/Assistance  Patient can return home with the following  A little help with walking and/or transfers;A little help with bathing/dressing/bathroom;Assistance with cooking/housework;Assist for transportation;Help with stairs or ramp for entrance    Equipment Recommendations None recommended by PT  Recommendations for Other Services       Functional Status Assessment Patient has had a recent decline in their functional status and demonstrates the ability to make significant improvements in function in a reasonable and predictable amount of time.     Precautions / Restrictions Precautions Precautions: Fall Precaution Comments: patient reports no falls in the last six months Restrictions Weight Bearing Restrictions: No      Mobility  Bed Mobility Overal bed mobility: Needs Assistance Bed Mobility: Supine to Sit, Sit to Supine     Supine to sit: Min guard, Supervision, HOB elevated Sit to supine: Min assist, Min guard   General bed mobility comments: sit to supine assist for lower extremities and extra time given    Transfers Overall transfer level: Needs assistance Equipment used: Rolling walker (2 wheels) Transfers: Sit to/from Stand Sit to Stand: Min guard, From elevated surface           General transfer comment: ICU bed; multiple attempts to complete power up; cues for hand placement and sequencing of steps    Ambulation/Gait  Ambulation/Gait assistance: Min guard Gait Distance (Feet): 100 Feet Assistive device: Rolling walker (2 wheels) Gait Pattern/deviations: Step-through pattern, Decreased step length - right, Decreased step length - left, Decreased stride length,  Trunk flexed, Narrow base of support Gait velocity: decreased     General Gait Details: slow, labored cadence using RW, narrow base of support, trunk flexed and cues to walk within base of support; limited by fatigue; on room air  Stairs            Wheelchair Mobility    Modified Rankin (Stroke Patients Only)       Balance Overall balance assessment: Needs assistance Sitting-balance support: Bilateral upper extremity supported, Feet supported Sitting balance-Leahy Scale: Good     Standing balance support: Bilateral upper extremity supported, During functional activity, Reliant on assistive device for balance Standing balance-Leahy Scale: Fair Standing balance comment: fair with RW         Pertinent Vitals/Pain Pain Assessment Pain Assessment: No/denies pain    Home Living Family/patient expects to be discharged to:: Private residence Living Arrangements: Spouse/significant other;Children Available Help at Discharge: Family;Available 24 hours/day Type of Home: House Home Access: Stairs to enter   [employer] of Steps: 2   Home Layout: One level Home Equipment: Hand held shower head;Grab bars - toilet;Grab bars - tub/shower;Rollator (4 wheels);Rolling Walker (2 wheels);BSC/3in1;Crutches;Shower seat;Toilet riser      Prior Function Prior Level of Function : Needs assist       Physical Assist : ADLs (physical)   ADLs (physical): Dressing;IADLs Mobility Comments: household ambulator with RW ADLs Comments: socks and shoes     Hand Dominance        Extremity/Trunk Assessment   Upper Extremity Assessment Upper Extremity Assessment: Generalized weakness    Lower Extremity Assessment Lower Extremity Assessment: Generalized weakness    Cervical / Trunk Assessment Cervical / Trunk Assessment: Kyphotic  Communication   Communication: No difficulties  Cognition Arousal/Alertness: Awake/alert Behavior During Therapy: WFL for tasks  assessed/performed Overall Cognitive Status: Within Functional Limits for tasks assessed        General Comments      Exercises     Assessment/Plan    PT Assessment Patient needs continued PT services  PT Problem List Decreased strength;Decreased mobility;Decreased activity tolerance;Decreased balance;Decreased knowledge of use of DME       PT Treatment Interventions DME instruction;Therapeutic exercise;Gait training;Balance training;Neuromuscular re-education;Functional mobility training;Stair training;Therapeutic activities;Patient/family education    PT Goals (Current goals can be found in the Care Plan section)  Acute Rehab PT Goals Patient Stated Goal: Go home with HHPT PT Goal Formulation: With patient/family Time For Goal Achievement: 06/18/22 Potential to Achieve Goals: Good    Frequency Min 3X/week        AM-PAC PT "6 Clicks" Mobility  Outcome Measure Help needed turning from your back to your side while in a flat bed without using bedrails?: A Little Help needed moving from lying on your back to sitting on the side of a flat bed without using bedrails?: A Lot Help needed moving to and from a bed to a chair (including a wheelchair)?: A Little Help needed standing up from a chair using your arms (e.g., wheelchair or bedside chair)?: A Lot Help needed to walk in hospital room?: A Little Help needed climbing 3-5 steps with a railing? : A Little 6 Click Score: 16    End of Session   Activity Tolerance: Patient tolerated treatment well;Patient limited by fatigue Patient left: in bed;with family/visitor present;with call bell/phone  within reach Nurse Communication: Mobility status PT Visit Diagnosis: Unsteadiness on feet (R26.81);Other abnormalities of gait and mobility (R26.89);Muscle weakness (generalized) (M62.81)    Time: 7011-0034 PT Time Calculation (min) (ACUTE ONLY): 28 min   Charges:   PT Evaluation $PT Eval Low Complexity: 1 Low PT  Treatments $Therapeutic Activity: 8-22 mins        Floria Raveling. Hartnett-Rands, MS, PT Per [facility] [phone]  Pamala Hurry  Hartnett-Rands 06/04/2022, 2:55 PM

## 2022-06-04 NOTE — Consult Note (Signed)
Gastroenterology Consult   Referring Provider: No ref. provider found Primary Care Physician:  [name], FNP Primary Gastroenterologist:  son requested Dr. Gala Romney, inadvertently placed on Dr. Ave Filter schedule recently.   Patient ID: Terry Hernandez; [id number]; 12/11/1934   Admit date: 06/03/2022  LOS: 1 day   Date of Consultation: 06/04/2022  Reason for Consultation:  sepsis due to cholangitis, ?need ERCP    History of Present Illness   Terry Hernandez is a 88 y.o. male with HTN, adenocarcinoma of either pancreas or cholangiocarcinoma s/p biliary stenting (uncovered metal stent placed 10/2021) followed by oncologist Dr. Dominica Severin B. Sherrill, complicated by suspected cholangitis with admission 03/2022 presenting via EMS with complaints of fever, generalized weakness. Son at bedside.  Patient reports decreased appetite for 2 to 3 days. Otherwise had been doing okay over the past couple of months. Has some vague mild abdominal pain at times but really nothing he felt was significant. No N/V. Yesterday noted to have Temp of 102, generalized weakness.   Patient last seen by oncology 04/23/22, according to the note there was some suspicious that liver lesions could be due to cholangitis as opposed to metastatic cholangiocarcinoma. Per son, there was discussion of possible liver biopsy but patient is refusing chemotherapy therefore decision made not to move forward. He has palliative come to his home once per month.    In the ED: Potassium 3.1, total bilirubin 2.8, alkaline phosphatase 625, AST 124, ALT 54.  LFTs 2 weeks ago showed normal total bilirubin 0.5, but his alk phos and AST are chronically elevated with AP 502, AST 46, ALT 24 two weeks ago.  Today total bilirubin 2.9, alk phos 510, AST 78, ALT 44.  On presentation, white blood cell count 16,700, hemoglobin 9.7 about baseline, platelets 334,000.  Initially lactic acid 3.3, down to 1.7 today.  Urine and blood cultures pending.  Hemoglobin  9.1 today, white blood cell count unchanged at 16,700.  INR is normal.    CT abdomen pelvis with contrast 06/03/2022: IMPRESSION: 1. Interval development of innumerable hepatic metastatic disease. 2. Common bile duct stent in place with stable biliary ductal dilatation. 3. Extensive colonic diverticulosis. No bowel obstruction. 4. Small ascites and anasarca, increased since the prior CT. 5. Partially visualized small bilateral pleural effusions with associated partial compressive atelectasis of the lower lobes versus pneumonia. 6. Aortic Atherosclerosis (ICD10-I70.0).    Electronically Signed   By: Anner Crete M.D.   On: 06/03/2022 19:50  Today: Temp of 102.5 yesterday afternoon, afebrile since.  Patient is on IV cefepime and metronidazole.  Son at bedside.  Patient sitting up in chair.  States he feels better with fluids.  Denies any abdominal pain.  Appetite has been good up until couple of days ago.  Denies nausea or vomiting.  Bowel movements are typically regular.  No melena or rectal bleeding.  States his biggest problem is joint pain.  Tolerated breakfast today.  Patient states he is not interested in chemotherapy but if he needed future ERCP for biliary obstruction/stent obstruction he would be open to this.    Previous work up:  ERCP 10/30/2021, obstruction not relieved with plastic stent, newly diagnosed pancreatic head tumor versus cholangiocarcinoma - A previously placed stent was removed as above. - One stent was removed from the biliary tree as above. - One uncovered metal stent was placed into the common bile duct.   ERCP 10/27/2021 The major papilla appeared normal. - A biliary sphincterotomy was performed. - Cells  for cytology obtained in the lower third of the main duct.  Cytology showed malignant cells consistent with adenocarcinoma - The biliary tree was swept to better delineate the stricture. - One plastic stent was placed into the common bile duct.   EGD  10/27/2021 -Tiny hiatal hernia. - A small amount of food (residue) in the stomach. - Normal ampulla, duodenal bulb, first portion of the duodenum, second portion of the duodenum, major papilla and third portion of the duodenum. - The examination was otherwise normal. - No specimens collected.     Prior to Admission medications   Medication Sig Start Date End Date Taking? Authorizing Provider  acetaminophen (TYLENOL) 325 MG tablet Take 650 mg by mouth every 6 (six) hours as needed for mild pain or fever.   Yes [provider]  ferrous sulfate 325 (65 FE) MG EC tablet Take 325 mg by mouth daily with breakfast.   Yes [provider]  methocarbamol (ROBAXIN) 500 MG tablet Take 1 tablet (500 mg total) by mouth every 6 (six) hours as needed for muscle spasms. 03/19/20  Yes Kristeen Miss, MD  metoprolol succinate (TOPROL-XL) 25 MG 24 hr tablet Take 1.5 tablets (37.5 mg total) by mouth daily. 01/14/22  Yes Buford Dresser, MD  mirtazapine (REMERON) 15 MG tablet TAKE 1/2 TABLET BY MOUTH AT BEDTIME 03/18/22  Yes [name], FNP  Multiple Vitamin (MULTIVITAMIN) tablet Take 1 tablet by mouth at bedtime.    Yes [provider]  pantoprazole (PROTONIX) 40 MG tablet Take 1 tablet (40 mg total) by mouth daily. 10/29/21 06/03/22 Yes Aline August, MD  senna-docusate (SENOKOT-S) 8.6-50 MG tablet Take 2 tablets by mouth at bedtime. Patient taking differently: Take 2 tablets by mouth at bedtime as needed for mild constipation. 11/17/21 11/17/22 Yes Roxan Hockey, MD  silodosin (RAPAFLO) 4 MG CAPS capsule Take 1 capsule (4 mg total) by mouth daily with breakfast. 05/21/22  Yes Irine Seal, MD  traMADol (ULTRAM) 50 MG tablet Take 50 mg by mouth every 6 (six) hours as needed for moderate pain. 01/14/22  Yes [provider]  feeding supplement (ENSURE ENLIVE / ENSURE PLUS) LIQD Take 237 mLs by mouth 3 (three) times daily between meals. 11/17/21   Roxan Hockey, MD     Current Facility-Administered Medications  Medication Dose Route Frequency Provider Last Rate Last Admin   acetaminophen (TYLENOL) tablet 650 mg  650 mg Oral Q6H PRN Zierle-Ghosh, Asia B, DO       Or   acetaminophen (TYLENOL) suppository 650 mg  650 mg Rectal Q6H PRN Zierle-Ghosh, Asia B, DO       ceFEPIme (MAXIPIME) 2 g in sodium chloride 0.9 % 100 mL IVPB  2 g Intravenous Q12H Zierle-Ghosh, Asia B, DO 200 mL/hr at 06/04/22 0449 2 g at 06/04/22 0449   Chlorhexidine Gluconate Cloth 2 % PADS 6 each  6 each Topical Daily Zierle-Ghosh, Asia B, DO       heparin injection 5,000 Units  5,000 Units Subcutaneous Q8H Zierle-Ghosh, Asia B, DO   5,000 Units at 06/04/22 0445   lactated ringers infusion   Intravenous Continuous Zierle-Ghosh, Asia B, DO 100 mL/hr at 06/04/22 5465 Infusion Verify at 06/04/22 0354   lactobacillus (FLORANEX/LACTINEX) granules 1 g  1 g Oral TID WC Shahmehdi, Seyed A, MD       magnesium sulfate IVPB 2 g 50 mL  2 g Intravenous Once Shahmehdi, Seyed A, MD       metoprolol succinate (TOPROL-XL) 24 hr tablet 37.5  mg  37.5 mg Oral Daily Zierle-Ghosh, Asia B, DO       metroNIDAZOLE (FLAGYL) IVPB 500 mg  500 mg Intravenous Q12H Zierle-Ghosh, Asia B, DO 100 mL/hr at 06/04/22 0804 500 mg at 06/04/22 0804   mirtazapine (REMERON) tablet 7.5 mg  7.5 mg Oral QHS Zierle-Ghosh, Asia B, DO   7.5 mg at 06/03/22 2211   ondansetron (ZOFRAN) tablet 4 mg  4 mg Oral Q6H PRN Zierle-Ghosh, Asia B, DO       Or   ondansetron (ZOFRAN) injection 4 mg  4 mg Intravenous Q6H PRN Zierle-Ghosh, Asia B, DO       pantoprazole (PROTONIX) EC tablet 40 mg  40 mg Oral Daily Zierle-Ghosh, Asia B, DO   40 mg at 06/03/22 2211   polyethylene glycol (MIRALAX / GLYCOLAX) packet 17 g  17 g Oral Daily PRN Zierle-Ghosh, Asia B, DO       tamsulosin (FLOMAX) capsule 0.4 mg  0.4 mg Oral QPC supper Zierle-Ghosh, [place] B, DO       traMADol (ULTRAM) tablet 50 mg  50 mg Oral Q6H PRN Zierle-Ghosh, Asia B, DO   50 mg at 06/03/22  2212    Allergies as of 06/03/2022 - Review Complete 06/03/2022  Allergen Reaction Noted   Cephalexin Rash     Past Medical History:  Diagnosis Date   Arthritis    Cancer ([place])    Colon cancer ([place])    GERD (gastroesophageal reflux disease)    PMH   Glucose intolerance (impaired glucose tolerance)    High cholesterol    History of kidney stones    HTN (hypertension)    Hyperlipidemia    Liver cancer ([name])    Lumbar stenosis with neurogenic claudication    Pneumonia    Subcutaneous mass    Wears glasses     Past Surgical History:  Procedure Laterality Date   APPENDECTOMY     BILIARY BRUSHING  10/26/2021   Procedure: BILIARY BRUSHING;  Surgeon: Clarene Essex, MD;  Location: [facility] ENDOSCOPY;  Service: Endoscopy;;   BILIARY STENT PLACEMENT  10/26/2021   Procedure: BILIARY STENT PLACEMENT;  Surgeon: Clarene Essex, MD;  Location: [place];  Service: Endoscopy;;   BILIARY STENT PLACEMENT  10/30/2021   Procedure: BILIARY STENT PLACEMENT;  Surgeon: Clarene Essex, MD;  Location: [place];  Service: Endoscopy;;   COLONOSCOPY W/ BIOPSIES AND POLYPECTOMY     ERCP N/A 10/26/2021   Procedure: ENDOSCOPIC RETROGRADE CHOLANGIOPANCREATOGRAPHY (ERCP);  Surgeon: Clarene Essex, MD;  Location: [place];  Service: Endoscopy;  Laterality: N/A;   ERCP N/A 10/30/2021   Procedure: ENDOSCOPIC RETROGRADE CHOLANGIOPANCREATOGRAPHY (ERCP);  Surgeon: Clarene Essex, MD;  Location: [place];  Service: Endoscopy;  Laterality: N/A;   ESOPHAGOGASTRODUODENOSCOPY N/A 10/26/2021   Procedure: ESOPHAGOGASTRODUODENOSCOPY (EGD);  Surgeon: Clarene Essex, MD;  Location: [place];  Service: Endoscopy;  Laterality: N/A;   LUMBAR LAMINECTOMY/DECOMPRESSION MICRODISCECTOMY Bilateral 03/18/2020   Procedure: Bilateral Lumbar three-four Lumbar four-five Laminotomy/foraminotomy;  Surgeon: Kristeen Miss, MD;  Location: [place];  Service: Neurosurgery;  Laterality: Bilateral;   MASS EXCISION Right 03/18/2020   Procedure: Excision  of a Right gluteal subcutaneous mass;  Surgeon: Kristeen Miss, MD;  Location: [place];  Service: Neurosurgery;  Laterality: Right;   MULTIPLE TOOTH EXTRACTIONS     SPHINCTEROTOMY  10/26/2021   Procedure: SPHINCTEROTOMY;  Surgeon: Clarene Essex, MD;  Location: [facility] ENDOSCOPY;  Service: Endoscopy;;   STENT REMOVAL  10/30/2021   Procedure: STENT REMOVAL;  Surgeon: Clarene Essex, MD;  Location: [place];  Service: Endoscopy;;    Family History  Problem Relation Age of Onset   Cancer Mother    Stroke Father    Cancer Son        lung    Social History   Socioeconomic History   Marital status: Married    Spouse name: Not on file   Number of children: 2   Years of education: Not on file   Highest education level: Not on file  Occupational History   Not on file  Tobacco Use   Smoking status: Never   Smokeless tobacco: Never  Vaping Use   Vaping Use: Never used  Substance and Sexual Activity   Alcohol use: Never   Drug use: Never   Sexual activity: Not Currently  Other Topics Concern   Not on file  Social History Narrative   Not on file   Social Determinants of Health   Financial Resource Strain: Not on file  Food Insecurity: Not on file  Transportation Needs: Not on file  Physical Activity: Not on file  Stress: Not on file  Social Connections: Not on file  Intimate Partner Violence: Not on file     Review of System:   General: Negative for  chills, fatigue.  See HPI Eyes: Negative for vision changes.  ENT: Negative for hoarseness, difficulty swallowing , nasal congestion. CV: Negative for chest pain, angina, palpitations, dyspnea on exertion, peripheral edema.  Respiratory: Negative for dyspnea at rest, dyspnea on exertion, cough, sputum, wheezing.  GI: See history of present illness. GU:  Negative for dysuria, hematuria, urinary incontinence, urinary frequency, nocturnal urination.  MS: Negative for low back pain.  Knee and shoulder pain, chronic. Derm: Negative for  rash or itching.  Neuro: Negative for weakness, abnormal sensation, seizure, frequent headaches, memory loss, confusion.  Psych: Negative for anxiety, depression, suicidal ideation, hallucinations.  Endo: Negative for unusual weight change.  Heme: Negative for bruising or bleeding. Allergy: Negative for rash or hives.      Physical Examination:   Vital signs in last 24 hours: Temp:  [97.5 F (36.4 C)-102.5 F (39.2 C)] 97.5 F (36.4 C) (06/22 0736) Pulse Rate:  [66-105] 72 (06/22 0800) Resp:  [10-30] 23 (06/22 0800) BP: (94-151)/(49-86) 112/86 (06/22 0700) SpO2:  [92 %-99 %] 99 % (06/22 0800) Weight:  [66.3 kg-70.8 kg] 66.3 kg (06/22 0037)    General: Elderly thin male alert and oriented, no acute distress.  Son at bedside.    Head: Normocephalic, atraumatic.   Eyes: Conjunctiva pink, no icterus. Mouth: Oropharyngeal mucosa moist and pink , no lesions erythema or exudate. Neck: Supple without thyromegaly, masses, or lymphadenopathy.  Lungs: Clear to auscultation bilaterally.  Heart: Regular rate and rhythm, no murmurs rubs or gallops.  Abdomen: Bowel sounds are normal, nontender, nondistended, no hepatosplenomegaly or masses, no abdominal bruits or hernia , no rebound or guarding.   Rectal: Not performed Extremities: No lower extremity edema, clubbing, deformity.  Neuro: Alert and oriented x 4 , grossly normal neurologically.  Skin: Warm and dry, no rash or jaundice.   Psych: Alert and cooperative, normal mood and affect.        Intake/Output from previous day: 06/21 0701 - 06/22 0700 In: 2502.4 [I.V.:555.2; IV Piggyback:1947.3] Out: 175 [Urine:175] Intake/Output this shift: Total I/O In: 746.8 [I.V.:746.8] Out: -   Lab Results:   CBC Recent Labs    06/03/22 1709 06/04/22 0347  WBC 16.7* 16.7*  HGB 9.7* 9.1*  HCT 29.8* 28.7*  MCV 96.4 99.3  PLT 334 265   BMET Recent Labs    06/03/22 1709 06/04/22 0347  NA 137 139  K 3.1* 4.6  CL 106 108  CO2 22 26   GLUCOSE 193* 132*  BUN 23 24*  CREATININE 1.27* 1.27*  CALCIUM 7.7* 7.8*   LFT Recent Labs    06/03/22 1709 06/04/22 0347  BILITOT 2.8* 2.9*  ALKPHOS 625* 510*  AST 124* 78*  ALT 54* 44  PROT 6.9 6.1*  ALBUMIN 2.5* 2.1*    Lipase No results for input(s): "LIPASE" in the last 72 hours.  PT/INR Recent Labs    06/03/22 1709 06/04/22 0347  LABPROT 15.2 15.4*  INR 1.2 1.2     Hepatitis Panel No results for input(s): "HEPBSAG", "HCVAB", "HEPAIGM", "HEPBIGM" in the last 72 hours.    Imaging Studies:   CT ABDOMEN PELVIS W CONTRAST  Result Date: 06/03/2022 CLINICAL DATA:  Sepsis and increased weakness and fever. Colon and liver cancer. EXAM: CT ABDOMEN AND PELVIS WITH CONTRAST TECHNIQUE: Multidetector CT imaging of the abdomen and pelvis was performed using the standard protocol following bolus administration of intravenous contrast. RADIATION DOSE REDUCTION: This exam was performed according to the departmental dose-optimization program which includes automated exposure control, adjustment of the mA and/or kV according to patient size and/or use of iterative reconstruction technique. CONTRAST:  62m OMNIPAQUE IOHEXOL 300 MG/ML  SOLN COMPARISON:  CT abdomen pelvis dated 04/09/2022. FINDINGS: Lower chest: Partially visualized small bilateral pleural effusions with associated partial compressive atelectasis of the lower lobes versus pneumonia. There is coronary vascular calcification. Mildly enlarged right cardiophrenic angle lymph node measures 10 mm in short axis. Several additional smaller and rounded lymph node noted at the right costophrenic angle anteriorly. No intra-abdominal free air.  Small ascites. Hepatobiliary: Innumerable hypoenhancing liver lesions most consistent with metastatic disease, new since the prior CT. There is biliary ductal dilatation similar to prior CT. The common bile duct stent is in place. No gallstone. Pancreas: Unremarkable. No pancreatic ductal  dilatation or surrounding inflammatory changes. Spleen: Normal in size without focal abnormality. Adrenals/Urinary Tract: The adrenal glands unremarkable. There is no hydronephrosis on either side. Small bilateral renal cysts and subcentimeter hypodense lesions which are too small to characterize. There is symmetric enhancement and excretion of contrast by both kidneys. The visualized ureters and urinary bladder appear unremarkable. Stomach/Bowel: There is extensive sigmoid diverticulosis and diffuse colonic diverticula. No bowel obstruction. Vascular/Lymphatic: Moderate aortoiliac atherosclerotic disease. The IVC is unremarkable. No portal venous gas. Several top-normal retroperitoneal lymph nodes. Reproductive: The prostate and seminal vesicles are grossly unremarkable. Other: Diffuse subcutaneous edema and anasarca. Musculoskeletal: Osteopenia with degenerative changes of the spine. No acute osseous pathology. IMPRESSION: 1. Interval development of innumerable hepatic metastatic disease. 2. Common bile duct stent in place with stable biliary ductal dilatation. 3. Extensive colonic diverticulosis. No bowel obstruction. 4. Small ascites and anasarca, increased since the prior CT. 5. Partially visualized small bilateral pleural effusions with associated partial compressive atelectasis of the lower lobes versus pneumonia. 6. Aortic Atherosclerosis (ICD10-I70.0). Electronically Signed   By: AAnner CreteM.D.   On: 06/03/2022 19:50   DG Chest Port 1 View  Result Date: 06/03/2022 CLINICAL DATA:  Question sepsis EXAM: PORTABLE CHEST 1 VIEW COMPARISON:  Chest 04/08/2022 FINDINGS: Heart size and vascularity normal. Negative for heart failure. Improved aeration of the bases with decrease in bibasilar airspace disease. Negative for pneumonia or effusion. Probable chronic lung disease with scarring in the bases. Chronic bilateral rib fractures. IMPRESSION: No acute abnormality.  Electronically Signed   By: Franchot Gallo M.D.   On: 06/03/2022 17:20   DG Knee AP/LAT W/[place] Left  Result Date: 05/27/2022 XR of the left knee demonstrates severe, bone on bone arthritis.  Mild varus alignment.  Osteophytes in all 3 compartments.   Impression: severe left knee arthritis.  Minnie.Brome week]  Assessment:   Pleasant 88 year old male with history of adenocarcinoma, suspected cholangiocarcinoma although adenocarcinoma of the pancreatic head not excluded, status post uncovered metal stent placement November 2022 presenting with fever.  Similar presentation in April, suspected to have cholangitis at that time, treated with antibiotic therapy and improved.  Had been doing relatively well up until 2 days ago and noted diminished appetite, temp of 102.5 yesterday.  GI consult requested to determine if ERCP needed, which would involve transfer to a facility offering procedure.  Adenocarcinoma/fever: Suspected cholangiocarcinoma versus pancreatic head adenocarcinoma.  Prior CT imaging showed fullness in the pancreatic head but no discrete mass.  Underwent uncovered metal stent placement November 2022.  Admission in April with suspected cholangitis, responded to antibiotic therapy.  Presents back with febrile illness.  Chronically he is AST, alkaline phosphatase are elevated.  Recent bump in total bilirubin, not differentiated.  Current CT with significant liver burden/metastatic disease, not well seen by CT in April but was apparent on MRI at that time.  Personally reviewed both CTs and MRI with radiologist, Dr. Leonia Reeves.  Intrahepatic biliary dilatation has not significantly changed since April.  Extrahepatic biliary dilation above stent present but also unchanged since April.  Although sludge formation or stent bile debris is not excluded at this time, would be more suspicious of small duct obstruction from encroaching tumor.  Patient continues to decline cancer treatment by way of chemotherapy but if need arises for repeat ERCP, patient  would be interested.  Plan:   Agree with antibiotic management.  LFTs tomorrow, can trend for significant change that may indicate extrahepatic biliary obstruction but would also expect ongoing abnormal LFTs due to tumor burden in the liver.  Patient may require antibiotic prophylaxis based on clinical course.    LOS: 1 day   We would like to thank you for the opportunity to participate in the care of Terry Hernandez.  Laureen Ochs. Bernarda Caffey [facility] [phone] 6/22/20238:06 AM

## 2022-06-04 NOTE — Consult Note (Cosign Needed)
Consultation Note Date: 06/04/2022   Patient Name: Terry Hernandez  DOB: 1934/02/12  MRN: [id number]  Age / Sex: 88 y.o., male  PCP: [name], FNP Referring Physician: Deatra James, MD  Reason for Consultation: Establishing goals of care  HPI/Patient Profile: 88 y.o. male  with past medical history of biliary adenocarcinoma, GERD, HTN/HLD, A-fib, admitted on 06/03/2022 with sepsis, blood culture growing gram-negative rods.   Clinical Assessment and Goals of Care: I have reviewed medical records including EPIC notes, labs and imaging, received report from RN, assessed the patient.  Mr. Holst is sitting up in the Epes chair in his room.  He greets me, making and somewhat keeping eye contact.  He appears chronically ill and somewhat frail, elderly.  He is alert and oriented x3, able to make his needs known.  His son, [name], is present at bedside.  We meet to discuss diagnosis prognosis, GOC, EOL wishes, disposition and options.  I introduced Palliative Medicine as specialized medical care for people living with serious illness. It focuses on providing relief from the symptoms and stress of a serious illness. The goal is to improve quality of life for both the patient and the family.  We discussed a brief life review of the patient.  [name] tells me that Mr. Spurrier's wife died 40 years ago.  His other son died in the fall 2019-03-10 just before his 21th birthday.  We focused on their current illness.  We talk about Mr. Sheldon's infection, stenting, possible need for ERCP.  At this point both Mr. Lege and [name] are agreeable to ERCP if needed.  Both Mr. Mandato and [name] shared that they would not be surprised if this issue were to recur.  At this point Mr. Parlett states that he would continue to have a ERCP it as needed.  We talk about rehospitalization, and that there may come a time when he no longer  wants to come to the hospital.  We talk about a few "what if's and maybe's".  The natural disease trajectory and expectations at EOL were discussed.  Advanced directives, concepts specific to code status, and rehospitalization were considered and discussed.   Both the patient and son/HCPOA endorse "treat the treatable, but allowing natural passing"/DNR.  Hospice and Palliative Care services outpatient were explained and offered.  At this point Mr. Stadler is active with outpatient palliative services through [facility].  They are considering transitioning to "treat the treatable" hospice care for more services.  We talked about the benefits of hospice care.  Family shares that they will talk with their provider about changing services.  Discussed the importance of continued conversation with family and the medical providers regarding overall plan of care and treatment options, ensuring decisions are within the context of the patient's values and GOCs.  Questions and concerns were addressed.  The patient and family was encouraged to call with questions or concerns.  PMT will continue to support holistically.  Conference with attending, bedside nursing staff, transition of care team  related to patient condition, needs, goals of care, disposition.   HCPOA NEXT OF KIN -son, Gracen Southwell.  Mr. Dascoli's wife died about 30 years ago.  He had another son who died in the fall 03-02-2021.    SUMMARY OF RECOMMENDATIONS   At this point continue to treat the treatable but no CPR or intubation. Agreeable to transfer to [facility] for ERCP if needed Declines chemotherapy/cancer treatments Active with outpatient palliative services with [facility], considering "treat the treatable" hospice care.   Code Status/Advance Care Planning: DNR -confirmed with patient and son/healthcare surrogate.  Symptom Management:  Per hospitalist, no additional needs at this time.  Palliative Prophylaxis:  Frequent  Pain Assessment and Oral Care  Additional Recommendations (Limitations, Scope, Preferences): Continue to treat the treatable but no CPR or intubation.  No chemotherapy.  Psycho-social/Spiritual:  Desire for further Chaplaincy support:yes Additional Recommendations: Caregiving  Support/Resources and Education on Hospice  Prognosis:  < 6 months, would not be surprising based on chronic illness burden, decreasing functional status, known cancer not seeking treatment.  Discharge Planning:  Home with son as caregiver, active with [facility] palliative services, considering transitioning to hospice.       Primary Diagnoses: Present on Admission:  Sepsis ([place])  Transaminitis  Malnutrition of moderate degree  Hypokalemia  GERD (gastroesophageal reflux disease)  Hyperbilirubinemia  Cholangitis  Atrial fibrillation ([place])   I have reviewed the medical record, interviewed the patient and family, and examined the patient. The following aspects are pertinent.  Past Medical History:  Diagnosis Date   Arthritis    Cancer ([place])    Colon cancer (H[facility])    GERD (gastroesophageal reflux disease)    PMH   Glucose intolerance (impaired glucose tolerance)    High cholesterol    History of kidney stones    HTN (hypertension)    Hyperlipidemia    Liver cancer (HCC)    Lumbar stenosis with neurogenic claudication    Pneumonia    Subcutaneous mass    Wears glasses    Social History   Socioeconomic History   Marital status: Married    Spouse name: Not on file   Number of children: 2   Years of education: Not on file   Highest education level: Not on file  Occupational History   Not on file  Tobacco Use   Smoking status: Never   Smokeless tobacco: Never  Vaping Use   Vaping Use: Never used  Substance and Sexual Activity   Alcohol use: Never   Drug use: Never   Sexual activity: Not Currently  Other Topics Concern   Not on file  Social History Narrative   Not on file    Social Determinants of Health   Financial Resource Strain: Not on file  Food Insecurity: Not on file  Transportation Needs: Not on file  Physical Activity: Not on file  Stress: Not on file  Social Connections: Not on file   Family History  Problem Relation Age of Onset   Cancer Mother    Stroke Father    Cancer Son        lung   Scheduled Meds:  Chlorhexidine Gluconate Cloth  6 each Topical Daily   feeding supplement  237 mL Oral BID BM   heparin  5,000 Units Subcutaneous Q8H   lactobacillus  1 g Oral TID WC   metoprolol succinate  37.5 mg Oral Daily   mirtazapine  7.5 mg Oral QHS   pantoprazole  40 mg Oral Daily  tamsulosin  0.4 mg Oral QPC supper   Continuous Infusions:  ceFEPime (MAXIPIME) IV Stopped (06/04/22 0519)   metronidazole Stopped (06/04/22 0904)   PRN Meds:.acetaminophen **OR** acetaminophen, ondansetron **OR** ondansetron (ZOFRAN) IV, polyethylene glycol, traMADol Medications Prior to Admission:  Prior to Admission medications   Medication Sig Start Date End Date Taking? Authorizing Provider  acetaminophen (TYLENOL) 325 MG tablet Take 650 mg by mouth every 6 (six) hours as needed for mild pain or fever.   Yes [provider]  ferrous sulfate 325 (65 FE) MG EC tablet Take 325 mg by mouth daily with breakfast.   Yes [provider]  methocarbamol (ROBAXIN) 500 MG tablet Take 1 tablet (500 mg total) by mouth every 6 (six) hours as needed for muscle spasms. 03/19/20  Yes Kristeen Miss, MD  metoprolol succinate (TOPROL-XL) 25 MG 24 hr tablet Take 1.5 tablets (37.5 mg total) by mouth daily. 01/14/22  Yes Buford Dresser, MD  mirtazapine (REMERON) 15 MG tablet TAKE 1/2 TABLET BY MOUTH AT BEDTIME 03/18/22  Yes [name], FNP  Multiple Vitamin (MULTIVITAMIN) tablet Take 1 tablet by mouth at bedtime.    Yes [provider]  pantoprazole (PROTONIX) 40 MG tablet Take 1 tablet (40 mg total) by mouth daily. 10/29/21 06/03/22 Yes Aline August, MD  senna-docusate (SENOKOT-S) 8.6-50 MG tablet Take 2 tablets by mouth at bedtime. Patient taking differently: Take 2 tablets by mouth at bedtime as needed for mild constipation. 11/17/21 11/17/22 Yes Roxan Hockey, MD  silodosin (RAPAFLO) 4 MG CAPS capsule Take 1 capsule (4 mg total) by mouth daily with breakfast. 05/21/22  Yes Irine Seal, MD  traMADol (ULTRAM) 50 MG tablet Take 50 mg by mouth every 6 (six) hours as needed for moderate pain. 01/14/22  Yes [provider]  feeding supplement (ENSURE ENLIVE / ENSURE PLUS) LIQD Take 237 mLs by mouth 3 (three) times daily between meals. 11/17/21   Roxan Hockey, MD   Allergies  Allergen Reactions   Cephalexin Rash    Patient has a history that includes tolerating 3rd gen cephalosporin [CTX] 11/2021   Review of Systems  Unable to perform ROS: Age    Physical Exam Vitals and nursing note reviewed.  Constitutional:      General: He is not in acute distress.    Appearance: He is ill-appearing.  Cardiovascular:     Rate and Rhythm: Normal rate.  Pulmonary:     Effort: Pulmonary effort is normal. No respiratory distress.  Skin:    General: Skin is warm and dry.  Neurological:     Mental Status: He is alert and oriented to person, place, and time.  Psychiatric:        Mood and Affect: Mood normal.        Behavior: Behavior normal.     Vital Signs: BP 132/75   Pulse 74   Temp 97.7 F (36.5 C) (Oral)   Resp (!) 28   Ht 6' (1.829 m)   Wt 66.3 kg   SpO2 97%   BMI 19.82 kg/m  Pain Scale: 0-10   Pain Score: 0-No pain   SpO2: SpO2: 97 % O2 Device:SpO2: 97 % O2 Flow Rate: .   IO: Intake/output summary:  Intake/Output Summary (Last 24 hours) at 06/04/2022 1413 Last data filed at 06/04/2022 1156 Gross per 24 hour  Intake 3743.89 ml  Output 175 ml  Net 3568.89 ml    LBM:   Baseline Weight: Weight: 70.8 kg Most recent weight: Weight: 66.3 kg  Palliative Assessment/Data:   Flowsheet Rows     Flowsheet Row Most Recent Value  Intake Tab   Referral Department Hospitalist  Unit at Time of Referral Intermediate Care Unit  Palliative Care Primary Diagnosis Other (Comment)  Date Notified 06/04/22  Palliative Care Type New Palliative care  Reason for referral Clarify Goals of Care  Date of Admission 06/03/22  Date first seen by Palliative Care 06/04/22  # of days Palliative referral response time 0 Day(s)  # of days IP prior to Palliative referral 1  Clinical Assessment   Palliative Performance Scale Score 40%  Pain Max last 24 hours Not able to report  Pain Min Last 24 hours Not able to report  Dyspnea Max Last 24 Hours Not able to report  Dyspnea Min Last 24 hours Not able to report  Psychosocial & Spiritual Assessment   Palliative Care Outcomes        Time In: 1120 Time Out: 1235 Time Total: 75 minutes  Greater than 50%  of this time was spent counseling and coordinating care related to the above assessment and plan.  Signed by: Drue Novel, NP   Please contact Palliative Medicine Team phone at [phone] for questions and concerns.  For individual provider: See Shea Evans

## 2022-06-04 NOTE — Plan of Care (Signed)
  Problem: Acute Rehab PT Goals(only PT should resolve) Goal: Pt will Roll Supine to Side Outcome: Progressing Flowsheets (Taken 06/04/2022 1501) Pt will Roll Supine to Side: with supervision Goal: Pt Will Go Supine/Side To Sit Outcome: Progressing Flowsheets (Taken 06/04/2022 1501) Pt will go Supine/Side to Sit:  with min guard assist  with minimal assist Goal: Pt Will Go Sit To Supine/Side Outcome: Progressing Flowsheets (Taken 06/04/2022 1501) Pt will go Sit to Supine/Side: with minimal assist Goal: Patient Will Transfer Sit To/From Stand Outcome: Progressing Flowsheets (Taken 06/04/2022 1501) Patient will transfer sit to/from stand:  with min guard assist  with minimal assist Goal: Pt Will Transfer Bed To Chair/Chair To Bed Outcome: Progressing Flowsheets (Taken 06/04/2022 1501) Pt will Transfer Bed to Chair/Chair to Bed: min guard assist Goal: Pt Will Ambulate Outcome: Progressing Flowsheets (Taken 06/04/2022 1501) Pt will Ambulate:  100 feet  with min guard assist  with rolling walker   Pamala Hurry D. Hartnett-Rands, MS, PT Per [facility] [phone] 06/04/2022

## 2022-06-04 NOTE — Progress Notes (Addendum)
PROGRESS NOTE    Patient: Terry Hernandez                            PCP: [name], FNP                    DOB: [birth year]            DOA: 06/03/2022 TKP:[id number]             DOS: 06/04/2022, 12:17 PM   LOS: 1 day   Date of Service: The patient was seen and examined on 06/04/2022  Subjective:   The patient was seen and examined this morning. When asked stable, denies any chest pain or shortness of breath.  Reporting improved abdominal pain.  Agreeable to transfer to [facility] if worsening condition  Addendum: Blood cultures are growing gram-negative rods  Brief Narrative:   Terry Hernandez is a 88 y.o. male with medical history significant of  GERD, hyperlipidemia, hypertension, biliary adenocarcinoma, and more presents ED with a chief complaint of fever. Patient is quite stoic, and does not give much history as a result. His son, at bedside, reports that patient had a fever up to 102 at home. He also had significant generalized weakness. The patient lives with his wife, and son lives nearby. When son was notified that patient had a fever, he went to check on him, and found the patient to be too weak for the son to be able to help him get out of bed. They had to call EMS for this reason. Patient does have a history of physical deconditioning, but can usually get up with a walker or with crutches and get himself around the home. Son reports that patient was at his baseline strength yesterday, so this came on acutely. P  Son goes on to report that the patient has had a decreased appetite for 3-4 days. Patient complained of abdominal pain as well.   ED Vitals : Temp 98.7-102.5, HR 85-105, RR 20-30, BP 102/56, satting at 96% Leukocytosis of 16.7, hemoglobin 9.7, platelets 334 Hypokalemia at 3.1 Magnesium 1.9 Creatinine at baseline 1.27 CT abdomen shows innumerable hepatic mets, common bile duct stent in appropriate position, small ascites and anasarca, bilateral pleural effusions Chest  x-ray shows no acute abnormality EKG shows sinus tach with a heart rate of 100, QTc 536 Negative COVID Transaminitis showing AST 124, ALT 54, T. bili 2.4 Lactic acidosis with lactic acid 3.3, 3.4 Admission requested for further management of sepsis secondary to cholangitis   Patient recently admitted for the same in April of this year.  At that time he was treated with IV Zosyn for presumptive cholangitis.  Patient had clinical improvement within 48 hours.  GI was consulted and recommended MRCP which showed common bile duct stent in proper position with mild biliary dilation some nonspecific findings could be cholangitis or possibly just due to his metastatic disease.  Patient's transaminitis and leukocytosis improved throughout the hospitalization.   At that time he opted against aggressive treatment for cancer.  He decided to be DNR at that time, and his been consistent with this decision.  He was discharged in stable condition with a prescription for Augmentin to finish his antibiotic course.   Patient also had a hospitalization in November of last year at which he had jaundice and was diagnosed with biliary adenocarcinoma.  He had common bile duct stricture at that time and biliary stent was  placed.      Assessment & Plan:   Principal Problem:   Sepsis ([place]) Active Problems:   Hyperbilirubinemia   GERD (gastroesophageal reflux disease)   Atrial fibrillation (HCC)   Generalized weakness   Transaminitis   Malnutrition of moderate degree   Cholangitis   Hypokalemia     Assessment and Plan: * Sepsis ([place]) BP (!) 121/55, pulse 80, temp97.7 F (36.5 C), RR 20, SpO2 98 %. -Lactic acid 2.5, 2.4 >> 0.7 -Procalcitonin 4.52, WBC 16.7, 16.7 -Improved sepsis physiology  -On admission febrile, tachycardic, tachypnea, leukocytosis, and lactic acidosis with transaminitis -1.75L Bolus in ED -Reduce IV infusion rate after bolus 2/2 pleural effusions, small ascites, and anasarca on  CT  -Blood cultures >>> are growing gram-negative rods  -Continue cefepime and flagyl - most likely source is cholangitis -UA -within normal limits -CXR shows no acute abnormality -We will continue to monitor on stepdown unit for sepsis   Hypokalemia -K+ 3.1 -2/2 Poor PO intake -Replace - mag today 1.9 -Continue to monitiorg-and repleting accordingly  Cholangitis -Sepsis due to cholangitis  -Vague abdominal pain, fever, transaminitis and elevated t bili -similar presentation in April - improved with antibiotics and did not require ERCP -Apparently was discussed discussed with GI overnight at admission-  Comfortable with treating the patient at [place] monitoring if needing ERCP will be transferred to [facility]  -Biliary duct stent in appropriate positioning    Malnutrition of moderate degree -Albumin 2.5 -2/2 poor PO intake -Encourage nutrient dense food choices -Continue to monitor  Transaminitis    Latest Ref Rng & Units 06/04/2022    3:47 AM 06/03/2022    5:09 PM 05/21/2022    1:02 PM  Hepatic Function  Total Protein 6.5 - 8.1 g/dL 6.1  6.9  7.4   Albumin 3.5 - 5.0 g/dL 2.1  2.5  3.5   AST 15 - 41 U/L 78  124  46   ALT 0 - 44 U/L 44  54  24   Alk Phosphatase 38 - 126 U/L 510  625  502   Total Bilirubin 0.3 - 1.2 mg/dL 2.9  2.8  0.5    Known cholangiocarcinoma, biliary stent placed in 10/2021, and now with new liver mets. Presents with fever, vague abdominal pain -> most likely cholangitis Continue treating sepsis per protocol -Aggressive IV fluid hydration (monitoring for pleural effusion, ascites and anasarca is present per CT) -Continue broad-spectrum antibiotics   Generalized weakness -2/2 deconditioning and acute illness -PT eval and treat -Treating underlying contributing factors including current sepsis -Continue to monitor  Atrial fibrillation (HCC) -- Chronic paroxysmal atrial fibrillation -Currently in sinus rhythm -Continue  metoprolol -ST on EKG today -Monitor on tele -not chronically anticoagulated  GERD (gastroesophageal reflux disease) Continue protonix -Stable  Hyperbilirubinemia -Monitoring closely T bili up to 2.8 from, 0.5 2/2 cholangitis Continue treatment for cholangitis, and continue to monitor on CMP -Appreciate GI input            ----------------------------------------------------------------------------------------------------------------------------------------------- Nutritional status:  The patient's BMI is: Body mass index is 19.82 kg/m. I agree with the assessment and plan as outlined below: Nutrition Status:           Skin Assessment: I have examined the patient's skin and I agree with the wound assessment as performed by wound care team As outlined belowe:     ------------------------------------------------------------------------------------------------------------------------------------------- Cultures; Blood Cultures x 2 >> NGT    DVT prophylaxis:  heparin injection 5,000 Units Start: 06/03/22 2200 SCDs Start:  06/03/22 2113   Code Status:   Code Status: DNR  Family Communication: No family member present at bedside- attempt will be made to update daily The above findings and plan of care has been discussed with patient (and family)  in detail,  they expressed understanding and agreement of above. -Advance care planning has been discussed.   Admission status:   Status is: Inpatient Remains inpatient appropriate because: Needing treatment for sepsis, with IV fluid, IV antibiotics, close monitoring     Procedures:   No admission procedures for hospital encounter.   Antimicrobials:  Anti-infectives (From admission, onward)    Start     Dose/Rate Route Frequency Ordered Stop   06/04/22 0900  metroNIDAZOLE (FLAGYL) IVPB 500 mg        500 mg 100 mL/hr over 60 Minutes Intravenous Every 12 hours 06/03/22 2112     06/04/22 0600  ceFEPIme  (MAXIPIME) 2 g in sodium chloride 0.9 % 100 mL IVPB        2 g 200 mL/hr over 30 Minutes Intravenous Every 12 hours 06/03/22 1820     06/03/22 1815  vancomycin (VANCOCIN) IVPB 1000 mg/200 mL premix  Status:  Discontinued        1,000 mg 200 mL/hr over 60 Minutes Intravenous Every 24 hours 06/03/22 1814 06/03/22 2112   06/03/22 1800  ceFEPIme (MAXIPIME) 2 g in sodium chloride 0.9 % 100 mL IVPB        2 g 200 mL/hr over 30 Minutes Intravenous  Once 06/03/22 1756 06/03/22 1844   06/03/22 1800  metroNIDAZOLE (FLAGYL) IVPB 500 mg        500 mg 100 mL/hr over 60 Minutes Intravenous  Once 06/03/22 1756 06/03/22 1910   06/03/22 1800  vancomycin (VANCOCIN) IVPB 1000 mg/200 mL premix  Status:  Discontinued        1,000 mg 200 mL/hr over 60 Minutes Intravenous  Once 06/03/22 1756 06/03/22 1814        Medication:   Chlorhexidine Gluconate Cloth  6 each Topical Daily   feeding supplement  237 mL Oral BID BM   heparin  5,000 Units Subcutaneous Q8H   lactobacillus  1 g Oral TID WC   metoprolol succinate  37.5 mg Oral Daily   mirtazapine  7.5 mg Oral QHS   pantoprazole  40 mg Oral Daily   tamsulosin  0.4 mg Oral QPC supper    acetaminophen **OR** acetaminophen, ondansetron **OR** ondansetron (ZOFRAN) IV, polyethylene glycol, traMADol   Objective:   Vitals:   06/04/22 1000 06/04/22 1020 06/04/22 1100 06/04/22 1134  BP: 103/69 112/75 (!) 121/55   Pulse: 74 84 74 80  Resp: 20 12 17 20   Temp:    97.7 F (36.5 C)  TempSrc:    Oral  SpO2: 98% 97% 99% 98%  Weight:      Height:        Intake/Output Summary (Last 24 hours) at 06/04/2022 1217 Last data filed at 06/04/2022 1156 Gross per 24 hour  Intake 3743.89 ml  Output 175 ml  Net 3568.89 ml   Filed Weights   06/03/22 1810 06/04/22 0037  Weight: 70.8 kg 66.3 kg     Examination:   Physical Exam  Constitution:  Alert, cooperative, no distress,  Appears calm and comfortable  Psychiatric:   Normal and stable mood and affect,  cognition intact,   HEENT:        Normocephalic, PERRL, otherwise with in Normal limits  Chest:  Chest symmetric Cardio vascular:  S1/S2, RRR, No murmure, No Rubs or Gallops  pulmonary: Clear to auscultation bilaterally, respirations unlabored, negative wheezes / crackles Abdomen: Soft, non-tender, non-distended, bowel sounds,no masses, no organomegaly Muscular skeletal: Limited exam - in bed, able to move all 4 extremities,   Neuro: CNII-XII intact. , normal motor and sensation, reflexes intact  Extremities: No pitting edema lower extremities, +2 pulses  Skin: Dry, warm to touch, negative for any Rashes, No open wounds Wounds: per nursing documentation   ------------------------------------------------------------------------------------------------------------------------------------------    LABs:     Latest Ref Rng & Units 06/04/2022    3:47 AM 06/03/2022    5:09 PM 05/21/2022    1:02 PM  CBC  WBC 4.0 - 10.5 K/uL 16.7  16.7  9.5   Hemoglobin 13.0 - 17.0 g/dL 9.1  9.7  10.4   Hematocrit 39.0 - 52.0 % 28.7  29.8  30.9   Platelets 150 - 400 K/uL 265  334  329       Latest Ref Rng & Units 06/04/2022    3:47 AM 06/03/2022    5:09 PM 05/21/2022    1:02 PM  CMP  Glucose 70 - 99 mg/dL 132  193  168   BUN 8 - 23 mg/dL 24  23  18    Creatinine 0.61 - 1.24 mg/dL 1.27  1.27  1.32   Sodium 135 - 145 mmol/L 139  137  139   Potassium 3.5 - 5.1 mmol/L 4.6  3.1  4.1   Chloride 98 - 111 mmol/L 108  106  101   CO2 22 - 32 mmol/L 26  22  26    Calcium 8.9 - 10.3 mg/dL 7.8  7.7  8.7   Total Protein 6.5 - 8.1 g/dL 6.1  6.9  7.4   Total Bilirubin 0.3 - 1.2 mg/dL 2.9  2.8  0.5   Alkaline Phos 38 - 126 U/L 510  625  502   AST 15 - 41 U/L 78  124  46   ALT 0 - 44 U/L 44  54  24        Micro Results Recent Results (from the past 240 hour(s))  Resp Panel by RT-PCR (Flu A&B, Covid) Anterior Nasal Swab     Status: None   Collection Time: 06/03/22  5:05 PM   Specimen: Anterior Nasal Swab   Result Value Ref Range Status   SARS Coronavirus 2 by RT PCR NEGATIVE NEGATIVE Final    Comment: (NOTE) SARS-CoV-2 target nucleic acids are NOT DETECTED.  The SARS-CoV-2 RNA is generally detectable in upper respiratory specimens during the acute phase of infection. The lowest concentration of SARS-CoV-2 viral copies this assay can detect is 138 copies/mL. A negative result does not preclude SARS-Cov-2 infection and should not be used as the sole basis for treatment or other patient management decisions. A negative result may occur with  improper specimen collection/handling, submission of specimen other than nasopharyngeal swab, presence of viral mutation(s) within the areas targeted by this assay, and inadequate number of viral copies(<138 copies/mL). A negative result must be combined with clinical observations, patient history, and epidemiological information. The expected result is Negative.  Fact Sheet for Patients:  EntrepreneurPulse.com.au  Fact Sheet for Healthcare Providers:  IncredibleEmployment.be  This test is no t yet approved or cleared by the [place] FDA and  has been authorized for detection and/or diagnosis of SARS-CoV-2 by FDA under an Emergency Use Authorization (EUA). This EUA will remain  in effect (meaning  this test can be used) for the duration of the COVID-19 declaration under Section 564(b)(1) of the Act, 21 U.S.C.section 360bbb-3(b)(1), unless the authorization is terminated  or revoked sooner.       Influenza A by PCR NEGATIVE NEGATIVE Final   Influenza B by PCR NEGATIVE NEGATIVE Final    Comment: (NOTE) The Xpert Xpress SARS-CoV-2/FLU/RSV plus assay is intended as an aid in the diagnosis of influenza from Nasopharyngeal swab specimens and should not be used as a sole basis for treatment. Nasal washings and aspirates are unacceptable for Xpert Xpress SARS-CoV-2/FLU/RSV testing.  Fact Sheet for  Patients: EntrepreneurPulse.com.au  Fact Sheet for Healthcare Providers: IncredibleEmployment.be  This test is not yet approved or cleared by the [place] FDA and has been authorized for detection and/or diagnosis of SARS-CoV-2 by FDA under an Emergency Use Authorization (EUA). This EUA will remain in effect (meaning this test can be used) for the duration of the COVID-19 declaration under Section 564(b)(1) of the Act, 21 U.S.C. section 360bbb-3(b)(1), unless the authorization is terminated or revoked.  Performed at [facility], [address], [place], [place] [address]   Blood Culture (routine x 2)     Status: None (Preliminary result)   Collection Time: 06/03/22  5:09 PM   Specimen: Right Antecubital; Blood  Result Value Ref Range Status   Specimen Description RIGHT ANTECUBITAL  Final   Special Requests   Final    BOTTLES DRAWN AEROBIC AND ANAEROBIC Blood Culture adequate volume   Culture  Setup Time   Final    GRAM NEGATIVE RODS BOTTLES DRAWN AEROBIC AND ANAEROBIC Gram Stain Report Called to,Read Back By and Verified With: MILLES,M@0850  BY MATTHEWS, B 6.22.23 Performed at [facility], [address] [place], [place] [address]    Culture PENDING  Incomplete   Report Status PENDING  Incomplete  Blood Culture (routine x 2)     Status: None (Preliminary result)   Collection Time: 06/03/22  5:09 PM   Specimen: BLOOD RIGHT FOREARM  Result Value Ref Range Status   Specimen Description BLOOD RIGHT FOREARM  Final   Special Requests   Final    BOTTLES DRAWN AEROBIC AND ANAEROBIC Blood Culture adequate volume   Culture  Setup Time   Final    GRAM NEGATIVE RODS BOTTLES DRAWN AEROBIC AND ANAEROBIC Gram Stain Report Called to,Read Back By and Verified With: MILLES, M@0850  BY MATTHEWS, B 0855 MATTHEWS, B 6.22.23 Performed at [facility], [address], [place], [place] [address]    Culture PENDING  Incomplete   Report Status PENDING  Incomplete   MRSA Next Gen by PCR, Nasal     Status: None   Collection Time: 06/03/22 10:56 PM   Specimen: Nasal Mucosa; Nasal Swab  Result Value Ref Range Status   MRSA by PCR Next Gen NOT DETECTED NOT DETECTED Final    Comment: (NOTE) The GeneXpert MRSA Assay (FDA approved for NASAL specimens only), is one component of a comprehensive MRSA colonization surveillance program. It is not intended to diagnose MRSA infection nor to guide or monitor treatment for MRSA infections. Test performance is not FDA approved in patients less than 55 years old. Performed at [facility], [address] [place], [place] [address]     Radiology Reports CT ABDOMEN PELVIS W CONTRAST  Result Date: 06/03/2022 CLINICAL DATA:  Sepsis and increased weakness and fever. Colon and liver cancer. EXAM: CT ABDOMEN AND PELVIS WITH CONTRAST TECHNIQUE: Multidetector CT imaging of the abdomen and pelvis was performed using  the standard protocol following bolus administration of intravenous contrast. RADIATION DOSE REDUCTION: This exam was performed according to the departmental dose-optimization program which includes automated exposure control, adjustment of the mA and/or kV according to patient size and/or use of iterative reconstruction technique. CONTRAST:  94m OMNIPAQUE IOHEXOL 300 MG/ML  SOLN COMPARISON:  CT abdomen pelvis dated 04/09/2022. FINDINGS: Lower chest: Partially visualized small bilateral pleural effusions with associated partial compressive atelectasis of the lower lobes versus pneumonia. There is coronary vascular calcification. Mildly enlarged right cardiophrenic angle lymph node measures 10 mm in short axis. Several additional smaller and rounded lymph node noted at the right costophrenic angle anteriorly. No intra-abdominal free air.  Small ascites. Hepatobiliary: Innumerable hypoenhancing liver lesions most consistent with metastatic disease, new since the prior CT. There is biliary ductal dilatation similar to prior  CT. The common bile duct stent is in place. No gallstone. Pancreas: Unremarkable. No pancreatic ductal dilatation or surrounding inflammatory changes. Spleen: Normal in size without focal abnormality. Adrenals/Urinary Tract: The adrenal glands unremarkable. There is no hydronephrosis on either side. Small bilateral renal cysts and subcentimeter hypodense lesions which are too small to characterize. There is symmetric enhancement and excretion of contrast by both kidneys. The visualized ureters and urinary bladder appear unremarkable. Stomach/Bowel: There is extensive sigmoid diverticulosis and diffuse colonic diverticula. No bowel obstruction. Vascular/Lymphatic: Moderate aortoiliac atherosclerotic disease. The IVC is unremarkable. No portal venous gas. Several top-normal retroperitoneal lymph nodes. Reproductive: The prostate and seminal vesicles are grossly unremarkable. Other: Diffuse subcutaneous edema and anasarca. Musculoskeletal: Osteopenia with degenerative changes of the spine. No acute osseous pathology. IMPRESSION: 1. Interval development of innumerable hepatic metastatic disease. 2. Common bile duct stent in place with stable biliary ductal dilatation. 3. Extensive colonic diverticulosis. No bowel obstruction. 4. Small ascites and anasarca, increased since the prior CT. 5. Partially visualized small bilateral pleural effusions with associated partial compressive atelectasis of the lower lobes versus pneumonia. 6. Aortic Atherosclerosis (ICD10-I70.0). Electronically Signed   By: AAnner CreteM.D.   On: 06/03/2022 19:50   DG Chest Port 1 View  Result Date: 06/03/2022 CLINICAL DATA:  Question sepsis EXAM: PORTABLE CHEST 1 VIEW COMPARISON:  Chest 04/08/2022 FINDINGS: Heart size and vascularity normal. Negative for heart failure. Improved aeration of the bases with decrease in bibasilar airspace disease. Negative for pneumonia or effusion. Probable chronic lung disease with scarring in the bases.  Chronic bilateral rib fractures. IMPRESSION: No acute abnormality. Electronically Signed   By: CFranchot GalloM.D.   On: 06/03/2022 17:20    SIGNED: SDeatra James MD, FHM. Critical time spent  >55 minutes in stabilizing the patient, reviewing all medical records, current labs, drawn plan of care, discussion with consultants and ICU   [facility],  Pager (please use [email or web address] to page/text) Please use Epic Secure Chat for non-urgent communication (7AM-7PM)  If 7PM-7AM, please contact night-coverage [email or web address], 06/04/2022, 12:17 PM

## 2022-06-04 NOTE — Progress Notes (Signed)
PHARMACY - PHYSICIAN COMMUNICATION CRITICAL VALUE ALERT - BLOOD CULTURE IDENTIFICATION (BCID)  Terry Hernandez is an 88 y.o. male who presented to [place] on 06/03/2022  Assessment:  klebsiella oxytoca BCID  Name of physician (or Provider) Contacted: Dr. Roger Shelter  Current antibiotics: Cefepime and flagyl  Changes to prescribed antibiotics recommended:  Recommend changing cefepime to ceftriaxone 2000 mg IV every 24 hours +/- flagyl  No results found for this or any previous visit.  Ramond Craver 06/04/2022  3:05 PM

## 2022-06-04 NOTE — Progress Notes (Signed)
Date and time results received: 06/04/22 0852 (use smartphrase ".now" to insert current time)  Test: Blood culture (both sets) Critical Value: Gram negative rods  Name of Provider Notified: MD Shahmehdi  Orders Received? Or Actions Taken?:  No new orders received at this time.

## 2022-06-05 DIAGNOSIS — R404 Transient alteration of awareness: Secondary | ICD-10-CM

## 2022-06-05 DIAGNOSIS — R7401 Elevation of levels of liver transaminase levels: Secondary | ICD-10-CM | POA: Diagnosis not present

## 2022-06-05 DIAGNOSIS — A419 Sepsis, unspecified organism: Secondary | ICD-10-CM | POA: Diagnosis not present

## 2022-06-05 DIAGNOSIS — R4182 Altered mental status, unspecified: Secondary | ICD-10-CM | POA: Diagnosis not present

## 2022-06-05 DIAGNOSIS — K8309 Other cholangitis: Secondary | ICD-10-CM | POA: Diagnosis not present

## 2022-06-05 DIAGNOSIS — E43 Unspecified severe protein-calorie malnutrition: Secondary | ICD-10-CM

## 2022-06-05 DIAGNOSIS — I480 Paroxysmal atrial fibrillation: Secondary | ICD-10-CM | POA: Diagnosis not present

## 2022-06-05 LAB — COMPREHENSIVE METABOLIC PANEL
ALT: 38 U/L (ref 0–44)
AST: 53 U/L — ABNORMAL HIGH (ref 15–41)
Albumin: 2.1 g/dL — ABNORMAL LOW (ref 3.5–5.0)
Alkaline Phosphatase: 417 U/L — ABNORMAL HIGH (ref 38–126)
Anion gap: 6 (ref 5–15)
BUN: 25 mg/dL — ABNORMAL HIGH (ref 8–23)
CO2: 23 mmol/L (ref 22–32)
Calcium: 7.8 mg/dL — ABNORMAL LOW (ref 8.9–10.3)
Chloride: 108 mmol/L (ref 98–111)
Creatinine, Ser: 1.22 mg/dL (ref 0.61–1.24)
GFR, Estimated: 58 mL/min — ABNORMAL LOW (ref >60)
Glucose, Bld: 147 mg/dL — ABNORMAL HIGH (ref 70–99)
Potassium: 3.6 mmol/L (ref 3.5–5.1)
Sodium: 137 mmol/L (ref 135–145)
Total Bilirubin: 1.8 mg/dL — ABNORMAL HIGH (ref 0.3–1.2)
Total Protein: 6.2 g/dL — ABNORMAL LOW (ref 6.5–8.1)

## 2022-06-05 LAB — CBC
HCT: 28.2 % — ABNORMAL LOW (ref 39.0–52.0)
Hemoglobin: 9.2 g/dL — ABNORMAL LOW (ref 13.0–17.0)
MCH: 31.4 pg (ref 26.0–34.0)
MCHC: 32.6 g/dL (ref 30.0–36.0)
MCV: 96.2 fL (ref 80.0–100.0)
Platelets: 298 10*3/uL (ref 150–400)
RBC: 2.93 MIL/uL — ABNORMAL LOW (ref 4.22–5.81)
RDW: 16.2 % — ABNORMAL HIGH (ref 11.5–15.5)
WBC: 14.9 10*3/uL — ABNORMAL HIGH (ref 4.0–10.5)
nRBC: 0 % (ref 0.0–0.2)

## 2022-06-05 LAB — URINE CULTURE: Culture: NO GROWTH

## 2022-06-05 MED ORDER — HALOPERIDOL LACTATE 5 MG/ML IJ SOLN
2.0000 mg | Freq: Once | INTRAMUSCULAR | Status: AC
Start: 2022-06-05 — End: 2022-06-05
  Administered 2022-06-05: 2 mg via INTRAVENOUS
  Filled 2022-06-05: qty 1

## 2022-06-05 MED ORDER — LACTATED RINGERS IV SOLN: INTRAVENOUS | Status: AC

## 2022-06-05 MED ORDER — HALOPERIDOL LACTATE 5 MG/ML IJ SOLN
2.0000 mg | Freq: Four times a day (QID) | INTRAMUSCULAR | Status: DC | PRN
Start: 2022-06-05 — End: 2022-06-08
  Administered 2022-06-05: 2 mg via INTRAVENOUS
  Filled 2022-06-05: qty 1

## 2022-06-05 MED ORDER — LORAZEPAM 2 MG/ML IJ SOLN
1.0000 mg | Freq: Once | INTRAMUSCULAR | Status: AC
  Administered 2022-06-05: 1 mg via INTRAVENOUS
  Filled 2022-06-05: qty 1

## 2022-06-05 NOTE — Assessment & Plan Note (Addendum)
-  Much improved, back to baseline  Delirium/confusion -Apparently likely due to sundowning, delirium

## 2022-06-05 NOTE — Care Management Important Message (Signed)
Important Message  Patient Details  Name: Terry Hernandez MRN: [id number] Date of Birth: May 14, 1934   Medicare Important Message Given:  Yes     Corey Harold 06/05/2022, 3:46 PM

## 2022-06-05 NOTE — Assessment & Plan Note (Signed)
-   Dietary consult, dietary supplements when tolerating p.o., and mentation improved

## 2022-06-06 ENCOUNTER — Inpatient Hospital Stay (HOSPITAL_COMMUNITY): Payer: Medicare Other

## 2022-06-06 DIAGNOSIS — G9340 Encephalopathy, unspecified: Secondary | ICD-10-CM

## 2022-06-06 DIAGNOSIS — K8309 Other cholangitis: Secondary | ICD-10-CM | POA: Diagnosis not present

## 2022-06-06 DIAGNOSIS — R404 Transient alteration of awareness: Secondary | ICD-10-CM | POA: Diagnosis not present

## 2022-06-06 DIAGNOSIS — R6520 Severe sepsis without septic shock: Secondary | ICD-10-CM | POA: Diagnosis not present

## 2022-06-06 DIAGNOSIS — I480 Paroxysmal atrial fibrillation: Secondary | ICD-10-CM | POA: Diagnosis not present

## 2022-06-06 DIAGNOSIS — A419 Sepsis, unspecified organism: Secondary | ICD-10-CM | POA: Diagnosis not present

## 2022-06-06 DIAGNOSIS — R7401 Elevation of levels of liver transaminase levels: Secondary | ICD-10-CM | POA: Diagnosis not present

## 2022-06-06 LAB — COMPREHENSIVE METABOLIC PANEL
ALT: 35 U/L (ref 0–44)
ALT: 34 U/L (ref 0–44)
AST: 55 U/L — ABNORMAL HIGH (ref 15–41)
AST: 56 U/L — ABNORMAL HIGH (ref 15–41)
Albumin: 2.1 g/dL — ABNORMAL LOW (ref 3.5–5.0)
Albumin: 2.1 g/dL — ABNORMAL LOW (ref 3.5–5.0)
Alkaline Phosphatase: 428 U/L — ABNORMAL HIGH (ref 38–126)
Alkaline Phosphatase: 410 U/L — ABNORMAL HIGH (ref 38–126)
Anion gap: 6 (ref 5–15)
Anion gap: 9 (ref 5–15)
BUN: 24 mg/dL — ABNORMAL HIGH (ref 8–23)
BUN: 26 mg/dL — ABNORMAL HIGH (ref 8–23)
CO2: 24 mmol/L (ref 22–32)
CO2: 22 mmol/L (ref 22–32)
Calcium: 7.8 mg/dL — ABNORMAL LOW (ref 8.9–10.3)
Calcium: 7.9 mg/dL — ABNORMAL LOW (ref 8.9–10.3)
Chloride: 107 mmol/L (ref 98–111)
Chloride: 107 mmol/L (ref 98–111)
Creatinine, Ser: 1.23 mg/dL (ref 0.61–1.24)
Creatinine, Ser: 1.47 mg/dL — ABNORMAL HIGH (ref 0.61–1.24)
GFR, Estimated: 58 mL/min — ABNORMAL LOW (ref >60)
GFR, Estimated: 46 mL/min — ABNORMAL LOW (ref >60)
Glucose, Bld: 120 mg/dL — ABNORMAL HIGH (ref 70–99)
Glucose, Bld: 185 mg/dL — ABNORMAL HIGH (ref 70–99)
Potassium: 3.9 mmol/L (ref 3.5–5.1)
Potassium: 3.5 mmol/L (ref 3.5–5.1)
Sodium: 137 mmol/L (ref 135–145)
Sodium: 138 mmol/L (ref 135–145)
Total Bilirubin: 2.8 mg/dL — ABNORMAL HIGH (ref 0.3–1.2)
Total Bilirubin: 1.7 mg/dL — ABNORMAL HIGH (ref 0.3–1.2)
Total Protein: 6.2 g/dL — ABNORMAL LOW (ref 6.5–8.1)
Total Protein: 6.2 g/dL — ABNORMAL LOW (ref 6.5–8.1)

## 2022-06-06 LAB — CULTURE, BLOOD (ROUTINE X 2)
Special Requests: ADEQUATE
Special Requests: ADEQUATE

## 2022-06-06 LAB — CBC
HCT: 27.7 % — ABNORMAL LOW (ref 39.0–52.0)
Hemoglobin: 9.4 g/dL — ABNORMAL LOW (ref 13.0–17.0)
MCH: 32 pg (ref 26.0–34.0)
MCHC: 33.9 g/dL (ref 30.0–36.0)
MCV: 94.2 fL (ref 80.0–100.0)
Platelets: 324 10*3/uL (ref 150–400)
RBC: 2.94 MIL/uL — ABNORMAL LOW (ref 4.22–5.81)
RDW: 16.6 % — ABNORMAL HIGH (ref 11.5–15.5)
WBC: 15.7 10*3/uL — ABNORMAL HIGH (ref 4.0–10.5)
nRBC: 0 % (ref 0.0–0.2)

## 2022-06-06 LAB — LACTIC ACID, PLASMA
Lactic Acid, Venous: 0.9 mmol/L (ref 0.5–1.9)
Lactic Acid, Venous: 1.5 mmol/L (ref 0.5–1.9)

## 2022-06-06 MED ORDER — LACTATED RINGERS IV SOLN
INTRAVENOUS | Status: AC
Start: 2022-06-06 — End: 2022-06-07

## 2022-06-06 MED ORDER — TRAZODONE HCL 50 MG PO TABS: 50.0000 mg | ORAL_TABLET | Freq: Every evening | ORAL | Status: DC | PRN

## 2022-06-07 ENCOUNTER — Inpatient Hospital Stay (HOSPITAL_COMMUNITY): Payer: Medicare Other

## 2022-06-07 DIAGNOSIS — R404 Transient alteration of awareness: Secondary | ICD-10-CM | POA: Diagnosis not present

## 2022-06-07 DIAGNOSIS — N179 Acute kidney failure, unspecified: Secondary | ICD-10-CM | POA: Diagnosis not present

## 2022-06-07 DIAGNOSIS — R7401 Elevation of levels of liver transaminase levels: Secondary | ICD-10-CM | POA: Diagnosis not present

## 2022-06-07 DIAGNOSIS — A419 Sepsis, unspecified organism: Secondary | ICD-10-CM | POA: Diagnosis not present

## 2022-06-07 DIAGNOSIS — K8309 Other cholangitis: Secondary | ICD-10-CM | POA: Diagnosis not present

## 2022-06-07 LAB — CBC
HCT: 25.4 % — ABNORMAL LOW (ref 39.0–52.0)
Hemoglobin: 8.6 g/dL — ABNORMAL LOW (ref 13.0–17.0)
MCH: 32 pg (ref 26.0–34.0)
MCHC: 33.9 g/dL (ref 30.0–36.0)
MCV: 94.4 fL (ref 80.0–100.0)
Platelets: 337 10*3/uL (ref 150–400)
RBC: 2.69 MIL/uL — ABNORMAL LOW (ref 4.22–5.81)
RDW: 17.1 % — ABNORMAL HIGH (ref 11.5–15.5)
WBC: 13.1 10*3/uL — ABNORMAL HIGH (ref 4.0–10.5)
nRBC: 0 % (ref 0.0–0.2)

## 2022-06-07 MED ORDER — ORAL CARE MOUTH RINSE: 15.0000 mL | OROMUCOSAL | Status: DC | PRN

## 2022-06-07 MED ORDER — LOPERAMIDE HCL 2 MG PO CAPS
2.0000 mg | ORAL_CAPSULE | ORAL | Status: DC | PRN
Start: 2022-06-07 — End: 2022-06-08
  Administered 2022-06-07: 2 mg via ORAL
  Filled 2022-06-07: qty 1

## 2022-06-07 NOTE — Progress Notes (Signed)
PROGRESS NOTE    Patient: Terry Hernandez                            PCP: Gabriel Earing, FNP                    DOB: 26-May-1934            DOA: 06/03/2022 ZOX:[id number]             DOS: 06/07/2022, 1:33 PM   LOS: 4 days   Date of Service: The patient was seen and examined on 06/07/2022  Subjective:   The patient was seen and examined this morning, much more awake, alert oriented, comfortable in bed, following command, son present at bedside  No events overnight  Blood cultures are growing  Klebsiella and  Bacteroides fragilis  Repeat blood cultures ordered>> no growth to date Hemodynamically stable  Brief Narrative:   JENNY REESER is a 88 y.o. male with medical history significant of  GERD, hyperlipidemia, hypertension, biliary adenocarcinoma, and more presents ED with a chief complaint of fever. Patient is quite stoic, and does not give much history as a result. His son, at bedside, reports that patient had a fever up to 102 at home. He also had significant generalized weakness. The patient lives with his wife, and son lives nearby. When son was notified that patient had a fever, he went to check on him, and found the patient to be too weak for the son to be able to help him get out of bed. They had to call EMS for this reason. Patient does have a history of physical deconditioning, but can usually get up with a walker or with crutches and get himself around the home. Son reports that patient was at his baseline strength yesterday, so this came on acutely. P  Son goes on to report that the patient has had a decreased appetite for 3-4 days. Patient complained of abdominal pain as well.   ED Vitals : Temp 98.7-102.5, HR 85-105, RR 20-30, BP 102/56, satting at 96% Leukocytosis of 16.7, hemoglobin 9.7, platelets 334 Hypokalemia at 3.1 Magnesium 1.9 Creatinine at baseline 1.27 CT abdomen shows innumerable hepatic mets, common bile duct stent in appropriate position, small ascites and  anasarca, bilateral pleural effusions Chest x-ray shows no acute abnormality EKG shows sinus tach with a heart rate of 100, QTc 536 Negative COVID Transaminitis showing AST 124, ALT 54, T. bili 2.4 Lactic acidosis with lactic acid 3.3, 3.4 Admission requested for further management of sepsis secondary to cholangitis   Patient recently admitted for the same in April of this year.  At that time he was treated with IV Zosyn for presumptive cholangitis.  Patient had clinical improvement within 48 hours.  GI was consulted and recommended MRCP which showed common bile duct stent in proper position with mild biliary dilation some nonspecific findings could be cholangitis or possibly just due to his metastatic disease.  Patient's transaminitis and leukocytosis improved throughout the hospitalization.   At that time he opted against aggressive treatment for cancer.  He decided to be DNR at that time, and his been consistent with this decision.  He was discharged in stable condition with a prescription for Augmentin to finish his antibiotic course.   Patient also had a hospitalization in November of last year at which he had jaundice and was diagnosed with biliary adenocarcinoma.  He had common bile duct stricture  at that time and biliary stent was placed.      Assessment & Plan:   Principal Problem:   Sepsis (HCC) Active Problems:   Altered mental status   GERD (gastroesophageal reflux disease)   Hyperbilirubinemia   AKI (acute kidney injury) (HCC)   Atrial fibrillation (HCC)   Generalized weakness   Transaminitis   Malnutrition of moderate degree   Cholangitis   Hypokalemia   Protein-calorie malnutrition, severe     Assessment and Plan: * Sepsis (HCC) -Which improved sepsis physiology  Blood pressure 140/88, pulse 93, temperature 98.5 F (36.9 C), temperature source Oral, resp. rate 18, height 6' (1.829 m), weight 69.9 kg, SpO2 96 %.  -Lactic acid 2.5, 2.4 >> 1.7 >> 0.9,  1.5 -Procalcitonin 4.52, WBC 16.7, 16.7>> 14.9 >> 15.7 >>13.1  -Improved sepsis physiology  -On admission febrile, tachycardic, tachypnea, leukocytosis, and lactic acidosis with transaminitis -1.75L Bolus in ED -Reduce IV infusion rate after bolus 2/2 pleural effusions, small ascites, and anasarca on CT  -Blood cultures >>> are growing Klebsiella pneumoniae -Repeating blood cultures >>   -Continue cefepime and flagyl - most likely source is cholangitis -UA -within normal limits -CXR shows no acute abnormality -We will continue to monitor on stepdown unit for sepsis   Altered mental status -Much improved, back to baseline  Delirium/confusion -Apparently likely due to sundowning, delirium -Haldol and Ativan as needed -We will continue with reorientation, as needed Haldol   GERD (gastroesophageal reflux disease) Continue protonix -Stable  Protein-calorie malnutrition, severe - Dietary consult, dietary supplements when tolerating p.o., and mentation improved  Hypokalemia -K+ 3.1 >>3.6  -2/2 Poor PO intake -Replace - mag 1.9 -Continue to monitiorg-and repleting accordingly  Cholangitis -Sepsis due to cholangitis -Improving sepsis physiology  -Vague abdominal pain, fever, transaminitis and elevated t bili -similar presentation in April - improved with antibiotics and did not require ERCP -Apparently was discussed discussed with GI overnight at admission-   Continue monitoring if needing ERCP will be transferred to [facility] -GI following closely, not recommending ERCP at this point   -Biliary duct stent in appropriate positioning    Malnutrition of moderate degree -Albumin 2.5 -2/2 poor PO intake -Encourage nutrient dense food choices -Continue to monitor  Transaminitis     Latest Ref Rng & Units 06/06/2022    2:59 PM 06/06/2022    2:20 AM 06/05/2022    3:56 AM  Hepatic Function  Total Protein 6.5 - 8.1 g/dL 6.2  6.2  6.2   Albumin 3.5 - 5.0 g/dL 2.1  2.1  2.1    AST 15 - 41 U/L 56  55  53   ALT 0 - 44 U/L 34  35  38   Alk Phosphatase 38 - 126 U/L 410  428  417   Total Bilirubin 0.3 - 1.2 mg/dL 1.7  2.8  1.8      known cholangiocarcinoma, biliary stent placed in 10/2021, and now with new liver mets. Presents with fever, vague abdominal pain -> most likely cholangitis Continue treating sepsis per protocol -Aggressive IV fluid hydration (monitoring for pleural effusion, ascites and anasarca is present per CT) -Continue broad-spectrum antibiotics  metronidazole 5 mg every 12 hours, Rocephin 2 g daily -Advancing diet  Generalized weakness -2/2 deconditioning and acute illness -PT eval and treat-when patient is more stable -Treating underlying contributing factors including current sepsis -Continue to monitor  Atrial fibrillation (HCC) -- Chronic paroxysmal atrial fibrillation -Currently in sinus rhythm -Continue metoprolol -Monitor on tele -not chronically anticoagulated  AKI (  acute kidney injury) (HCC) - Due to poor p.o. intake, nutrition -Continue gentle IV fluid hydration -Avoiding nephrotoxins Creatinine 1.23>> 1.47   Hyperbilirubinemia -Monitoring closely T bili up to 2.8 from, 0.5 >>> 1.7 today 2/2 cholangitis Continue treatment for cholangitis, and continue to monitor on CMP -Appreciate GI input     ----------------------------------------------------------------------------------------------------------------------------------------------- Nutritional status:  The patient's BMI is: Body mass index is 20.9 kg/m. I agree with the assessment and plan as outlined below: Nutrition Status: Nutrition Problem: Severe Malnutrition Etiology: chronic illness (biliary adenocarcinoma) Signs/Symptoms: percent weight loss, moderate fat depletion, moderate muscle depletion, severe muscle depletion Percent weight loss: 7 % Interventions: Ensure Enlive (each supplement provides 350kcal and 20 grams of protein)     Skin  Assessment: I have examined the patient's skin and I agree with the wound assessment as performed by wound care team As outlined ------------------------------------------------------------------------------------------------------------------------------------------- Cultures; Blood Cultures x 2 >> Klebsiella and  Bacteroides fragilis  Cultures x2 06/05/2021    DVT prophylaxis:  heparin injection 5,000 Units Start: 06/03/22 2200 SCDs Start: 06/03/22 2113   Code Status:   Code Status: DNR  Family Communication: No family member present at bedside- attempt will be made to update daily The above findings and plan of care has been discussed with patient (and family)  in detail,  they expressed understanding and agreement of above. -Advance care planning has been discussed.   Admission status:   Status is: Inpatient Remains inpatient appropriate because: Needing treatment for sepsis, with IV fluid, IV antibiotics, close monitoring     Procedures:   No admission procedures for hospital encounter.   Antimicrobials:  Anti-infectives (From admission, onward)    Start     Dose/Rate Route Frequency Ordered Stop   06/05/22 0600  cefTRIAXone (ROCEPHIN) 2 g in sodium chloride 0.9 % 100 mL IVPB        2 g 200 mL/hr over 30 Minutes Intravenous Every 24 hours 06/04/22 1539     06/04/22 1800  ceFEPIme (MAXIPIME) 2 g in sodium chloride 0.9 % 100 mL IVPB        2 g 200 mL/hr over 30 Minutes Intravenous Every 12 hours 06/04/22 1539 06/04/22 1926   06/04/22 0900  metroNIDAZOLE (FLAGYL) IVPB 500 mg        500 mg 100 mL/hr over 60 Minutes Intravenous Every 12 hours 06/03/22 2112     06/04/22 0600  ceFEPIme (MAXIPIME) 2 g in sodium chloride 0.9 % 100 mL IVPB  Status:  Discontinued        2 g 200 mL/hr over 30 Minutes Intravenous Every 12 hours 06/03/22 1820 06/04/22 1539   06/03/22 1815  vancomycin (VANCOCIN) IVPB 1000 mg/200 mL premix  Status:  Discontinued        1,000 mg 200 mL/hr over  60 Minutes Intravenous Every 24 hours 06/03/22 1814 06/03/22 2112   06/03/22 1800  ceFEPIme (MAXIPIME) 2 g in sodium chloride 0.9 % 100 mL IVPB        2 g 200 mL/hr over 30 Minutes Intravenous  Once 06/03/22 1756 06/03/22 1844   06/03/22 1800  metroNIDAZOLE (FLAGYL) IVPB 500 mg        500 mg 100 mL/hr over 60 Minutes Intravenous  Once 06/03/22 1756 06/03/22 1910   06/03/22 1800  vancomycin (VANCOCIN) IVPB 1000 mg/200 mL premix  Status:  Discontinued        1,000 mg 200 mL/hr over 60 Minutes Intravenous  Once 06/03/22 1756 06/03/22 1814  Medication:   Chlorhexidine Gluconate Cloth  6 each Topical Daily   feeding supplement  237 mL Oral BID BM   heparin  5,000 Units Subcutaneous Q8H   lactobacillus  1 g Oral TID WC   metoprolol succinate  37.5 mg Oral Daily   mirtazapine  7.5 mg Oral QHS   pantoprazole  40 mg Oral Daily   tamsulosin  0.4 mg Oral QPC supper    haloperidol lactate, ondansetron **OR** ondansetron (ZOFRAN) IV, polyethylene glycol, traMADol   Objective:   Vitals:   06/07/22 0358 06/07/22 0745 06/07/22 0800 06/07/22 0900  BP: (!) 146/67  (!) 166/88 140/88  Pulse: 92  (!) 113 93  Resp: (!) 22  13 18   Temp: 99 F (37.2 C) 98.5 F (36.9 C)    TempSrc: Oral Oral    SpO2: 92%  92% 96%  Weight:      Height:        Intake/Output Summary (Last 24 hours) at 06/07/2022 1333 Last data filed at 06/07/2022 1000 Gross per 24 hour  Intake 1217.94 ml  Output 401 ml  Net 816.94 ml   Filed Weights   06/03/22 1810 06/04/22 0037 06/05/22 0500  Weight: 70.8 kg 66.3 kg 69.9 kg     Examination:     General:  Much more awake alert oriented this a.m., x2,  cooperative, no distress;   HEENT:  Normocephalic, PERRL, otherwise with in Normal limits   Neuro:  CNII-XII intact. , normal motor and sensation, reflexes intact   Lungs:   Clear to auscultation BL, Respirations unlabored,  No wheezes / crackles  Cardio:    S1/S2, RRR, No murmure, No Rubs or Gallops    Abdomen:  Soft, non-tender, bowel sounds active all four quadrants, no guarding or peritoneal signs.  Muscular  skeletal:  Limited exam - sever global generalized weaknesses - in bed, able to move all 4 extremities,   2+ pulses,  symmetric, No pitting edema  Skin:  Dry, warm to touch, negative for any Rashes,  Wounds: Please see nursing documentation        -------------------------------------------------------------------------------------------------------------------    LABs:     Latest Ref Rng & Units 06/07/2022    2:35 AM 06/06/2022    2:20 AM 06/05/2022    3:56 AM  CBC  WBC 4.0 - 10.5 K/uL 13.1  15.7  14.9   Hemoglobin 13.0 - 17.0 g/dL 8.6  9.4  9.2   Hematocrit 39.0 - 52.0 % 25.4  27.7  28.2   Platelets 150 - 400 K/uL 337  324  298       Latest Ref Rng & Units 06/06/2022    2:59 PM 06/06/2022    2:20 AM 06/05/2022    3:56 AM  CMP  Glucose 70 - 99 mg/dL 132  440  102   BUN 8 - 23 mg/dL 26  24  25    Creatinine 0.61 - 1.24 mg/dL 7.25  3.66  4.40   Sodium 135 - 145 mmol/L 138  137  137   Potassium 3.5 - 5.1 mmol/L 3.5  3.9  3.6   Chloride 98 - 111 mmol/L 107  107  108   CO2 22 - 32 mmol/L 22  24  23    Calcium 8.9 - 10.3 mg/dL 7.9  7.8  7.8   Total Protein 6.5 - 8.1 g/dL 6.2  6.2  6.2   Total Bilirubin 0.3 - 1.2 mg/dL 1.7  2.8  1.8   Alkaline Phos 38 - 126  U/L 410  428  417   AST 15 - 41 U/L 56  55  53   ALT 0 - 44 U/L 34  35  38        Micro Results Recent Results (from the past 240 hour(s))  Resp Panel by RT-PCR (Flu A&B, Covid) Anterior Nasal Swab     Status: None   Collection Time: 06/03/22  5:05 PM   Specimen: Anterior Nasal Swab  Result Value Ref Range Status   SARS Coronavirus 2 by RT PCR NEGATIVE NEGATIVE Final    Comment: (NOTE) SARS-CoV-2 target nucleic acids are NOT DETECTED.  The SARS-CoV-2 RNA is generally detectable in upper respiratory specimens during the acute phase of infection. The lowest concentration of SARS-CoV-2 viral copies this  assay can detect is 138 copies/mL. A negative result does not preclude SARS-Cov-2 infection and should not be used as the sole basis for treatment or other patient management decisions. A negative result may occur with  improper specimen collection/handling, submission of specimen other than nasopharyngeal swab, presence of viral mutation(s) within the areas targeted by this assay, and inadequate number of viral copies(<138 copies/mL). A negative result must be combined with clinical observations, patient history, and epidemiological information. The expected result is Negative.  Fact Sheet for Patients:  BloggerCourse.com  Fact Sheet for Healthcare Providers:  SeriousBroker.it  This test is no t yet approved or cleared by the [place] FDA and  has been authorized for detection and/or diagnosis of SARS-CoV-2 by FDA under an Emergency Use Authorization (EUA). This EUA will remain  in effect (meaning this test can be used) for the duration of the COVID-19 declaration under Section 564(b)(1) of the Act, 21 U.S.C.section 360bbb-3(b)(1), unless the authorization is terminated  or revoked sooner.       Influenza A by PCR NEGATIVE NEGATIVE Final   Influenza B by PCR NEGATIVE NEGATIVE Final    Comment: (NOTE) The Xpert Xpress SARS-CoV-2/FLU/RSV plus assay is intended as an aid in the diagnosis of influenza from Nasopharyngeal swab specimens and should not be used as a sole basis for treatment. Nasal washings and aspirates are unacceptable for Xpert Xpress SARS-CoV-2/FLU/RSV testing.  Fact Sheet for Patients: BloggerCourse.com  Fact Sheet for Healthcare Providers: SeriousBroker.it  This test is not yet approved or cleared by the [place] FDA and has been authorized for detection and/or diagnosis of SARS-CoV-2 by FDA under an Emergency Use Authorization (EUA). This EUA will  remain in effect (meaning this test can be used) for the duration of the COVID-19 declaration under Section 564(b)(1) of the Act, 21 U.S.C. section 360bbb-3(b)(1), unless the authorization is terminated or revoked.  Performed at [facility], [address], [place], [place] [address]   Blood Culture (routine x 2)     Status: Abnormal   Collection Time: 06/03/22  5:09 PM   Specimen: Right Antecubital; Blood  Result Value Ref Range Status   Specimen Description   Final    RIGHT ANTECUBITAL Performed at [facility], [address] [place], [place] [address]    Special Requests   Final    BOTTLES DRAWN AEROBIC AND ANAEROBIC Blood Culture adequate volume Performed at [facility], [address], [place], [place] [address]    Culture  Setup Time   Final    GRAM NEGATIVE RODS BOTTLES DRAWN AEROBIC AND ANAEROBIC Gram Stain Report Called to,Read Back By and Verified With: MILLES,M@0850  BY MATTHEWS, B 6.22.23 CRITICAL RESULT CALLED TO, READ BACK BY AND VERIFIED WITH: PHARMD  G. COFFEE [id number] @1503  FH Performed at [facility], [address], [place], [place] [address]    Culture KLEBSIELLA OXYTOCA (A)  Final   Report Status 06/06/2022 FINAL  Final   Organism ID, Bacteria KLEBSIELLA OXYTOCA  Final      Susceptibility   Klebsiella oxytoca - MIC*    AMPICILLIN >=32 RESISTANT Resistant     CEFAZOLIN 8 SENSITIVE Sensitive     CEFEPIME <=0.12 SENSITIVE Sensitive     CEFTAZIDIME <=1 SENSITIVE Sensitive     CEFTRIAXONE <=0.25 SENSITIVE Sensitive     CIPROFLOXACIN <=0.25 SENSITIVE Sensitive     GENTAMICIN <=1 SENSITIVE Sensitive     IMIPENEM <=0.25 SENSITIVE Sensitive     TRIMETH/SULFA <=20 SENSITIVE Sensitive     AMPICILLIN/SULBACTAM 8 SENSITIVE Sensitive     PIP/TAZO <=4 SENSITIVE Sensitive     * KLEBSIELLA OXYTOCA  Blood Culture (routine x 2)     Status: Abnormal   Collection Time: 06/03/22  5:09 PM   Specimen: BLOOD RIGHT FOREARM  Result Value Ref Range Status   Specimen  Description   Final    BLOOD RIGHT FOREARM Performed at [facility], [address], [place], [place] [address]    Special Requests   Final    BOTTLES DRAWN AEROBIC AND ANAEROBIC Blood Culture adequate volume Performed at [facility], [address], [place], [place] [address]    Culture  Setup Time   Final    GRAM NEGATIVE RODS BOTTLES DRAWN AEROBIC AND ANAEROBIC Gram Stain Report Called to,Read Back By and Verified With: MILLES, M@0850  BY MATTHEWS, B 0855 MATTHEWS, B 6.22.23 Performed at [facility], [address], [place], [place] [address]    Culture (A)  Final    KLEBSIELLA OXYTOCA SUSCEPTIBILITIES PERFORMED ON PREVIOUS CULTURE WITHIN THE LAST 5 DAYS. Performed at [facility], [address], [place], [place] [address]    Report Status 06/06/2022 FINAL  Final  Blood Culture ID Panel (Reflexed)     Status: Abnormal   Collection Time: 06/03/22  5:09 PM  Result Value Ref Range Status   Enterococcus faecalis NOT DETECTED NOT DETECTED Final   Enterococcus Faecium NOT DETECTED NOT DETECTED Final   Listeria monocytogenes NOT DETECTED NOT DETECTED Final   Staphylococcus species NOT DETECTED NOT DETECTED Final   Staphylococcus aureus (BCID) NOT DETECTED NOT DETECTED Final   Staphylococcus epidermidis NOT DETECTED NOT DETECTED Final   Staphylococcus lugdunensis NOT DETECTED NOT DETECTED Final   Streptococcus species NOT DETECTED NOT DETECTED Final   Streptococcus agalactiae NOT DETECTED NOT DETECTED Final   Streptococcus pneumoniae NOT DETECTED NOT DETECTED Final   Streptococcus pyogenes NOT DETECTED NOT DETECTED Final   A.calcoaceticus-baumannii NOT DETECTED NOT DETECTED Final   Bacteroides fragilis NOT DETECTED NOT DETECTED Final   Enterobacterales DETECTED (A) NOT DETECTED Final    Comment: Enterobacterales represent a large order of gram negative bacteria, not a single organism. CRITICAL RESULT CALLED TO, READ BACK BY AND VERIFIED WITH: PHARMD G. COFFEE [id number] @1503  FH     Enterobacter cloacae complex NOT DETECTED NOT DETECTED Final   Escherichia coli NOT DETECTED NOT DETECTED Final   Klebsiella aerogenes NOT DETECTED NOT DETECTED Final   Klebsiella oxytoca DETECTED (A) NOT DETECTED Final    Comment: CRITICAL RESULT CALLED TO, READ BACK BY AND VERIFIED WITH: PHARMD G. COFFEE [id number] @1503  FH    Klebsiella pneumoniae NOT DETECTED NOT DETECTED Final   Proteus species NOT DETECTED NOT DETECTED Final   Salmonella species NOT  DETECTED NOT DETECTED Final   Serratia marcescens NOT DETECTED NOT DETECTED Final   Haemophilus influenzae NOT DETECTED NOT DETECTED Final   Neisseria meningitidis NOT DETECTED NOT DETECTED Final   Pseudomonas aeruginosa NOT DETECTED NOT DETECTED Final   Stenotrophomonas maltophilia NOT DETECTED NOT DETECTED Final   Candida albicans NOT DETECTED NOT DETECTED Final   Candida auris NOT DETECTED NOT DETECTED Final   Candida glabrata NOT DETECTED NOT DETECTED Final   Candida krusei NOT DETECTED NOT DETECTED Final   Candida parapsilosis NOT DETECTED NOT DETECTED Final   Candida tropicalis NOT DETECTED NOT DETECTED Final   Cryptococcus neoformans/gattii NOT DETECTED NOT DETECTED Final   CTX-M ESBL NOT DETECTED NOT DETECTED Final   Carbapenem resistance IMP NOT DETECTED NOT DETECTED Final   Carbapenem resistance KPC NOT DETECTED NOT DETECTED Final   Carbapenem resistance NDM NOT DETECTED NOT DETECTED Final   Carbapenem resist OXA 48 LIKE NOT DETECTED NOT DETECTED Final   Carbapenem resistance VIM NOT DETECTED NOT DETECTED Final    Comment: Performed at [facility], [address], [place], [place] [address]  Urine Culture     Status: None   Collection Time: 06/03/22  6:08 PM   Specimen: Urine, Clean Catch  Result Value Ref Range Status   Specimen Description   Final    URINE, CLEAN CATCH Performed at [facility], [address] [place], [place] [address]    Special Requests   Final    NONE Performed at [facility], [address] [place], [place] [address]    Culture   Final    NO GROWTH Performed at [facility], [address] [place], [place] [address]    Report Status 06/05/2022 FINAL  Final  MRSA Next Gen by PCR, Nasal     Status: None   Collection Time: 06/03/22 10:56 PM   Specimen: Nasal Mucosa; Nasal Swab  Result Value Ref Range Status   MRSA by PCR Next Gen NOT DETECTED NOT DETECTED Final    Comment: (NOTE) The GeneXpert MRSA Assay (FDA approved for NASAL specimens only), is one component of a comprehensive MRSA colonization surveillance program. It is not intended to diagnose MRSA infection nor to guide or monitor treatment for MRSA infections. Test performance is not FDA approved in patients less than 52 years old. Performed at [facility], [address], [place], [place] [address]   Culture, blood (Routine X 2) w Reflex to ID Panel     Status: None (Preliminary result)   Collection Time: 06/05/22  7:30 AM   Specimen: BLOOD RIGHT HAND  Result Value Ref Range Status   Specimen Description BLOOD RIGHT HAND  Final   Special Requests   Final    BOTTLES DRAWN AEROBIC AND ANAEROBIC Blood Culture adequate volume   Culture   Final    NO GROWTH 2 DAYS Performed at [facility], [address] [place], [place] [address]    Report Status PENDING  Incomplete  Culture, blood (Routine X 2) w Reflex to ID Panel     Status: None (Preliminary result)   Collection Time: 06/05/22  7:30 AM   Specimen: BLOOD LEFT HAND  Result Value Ref Range Status   Specimen Description BLOOD LEFT HAND  Final   Special Requests   Final    BOTTLES DRAWN AEROBIC ONLY Blood Culture adequate volume   Culture   Final    NO GROWTH 2 DAYS Performed at [facility], [address], [place],  [place] [address]    Report Status PENDING  Incomplete  Culture, blood (Routine X 2) w Reflex to ID Panel     Status: None (Preliminary result)   Collection Time: 06/06/22 12:56 PM   Specimen: Right Antecubital; Blood  Result Value  Ref Range Status   Specimen Description   Final    RIGHT ANTECUBITAL BOTTLES DRAWN AEROBIC AND ANAEROBIC   Special Requests   Final    Blood Culture results may not be optimal due to an excessive volume of blood received in culture bottles   Culture   Final    NO GROWTH < 12 HOURS Performed at [facility], [address], [place], [place] [address]    Report Status PENDING  Incomplete  Culture, blood (Routine X 2) w Reflex to ID Panel     Status: None (Preliminary result)   Collection Time: 06/06/22  1:05 PM   Specimen: BLOOD RIGHT HAND  Result Value Ref Range Status   Specimen Description   Final    BLOOD RIGHT HAND BOTTLES DRAWN AEROBIC AND ANAEROBIC   Special Requests   Final    Blood Culture results may not be optimal due to an excessive volume of blood received in culture bottles   Culture   Final    NO GROWTH < 12 HOURS Performed at [facility], [address], [place], [place] [address]    Report Status PENDING  Incomplete    Radiology Reports No results found.  SIGNED: Kendell Bane, MD, FHM. Critical time spent  >55 minutes in stabilizing the patient, reviewing all medical records, current labs, drawn plan of care, discussion with consultants and ICU   [facility],  Pager (please use [email or web address] to page/text) Please use Epic Secure Chat for non-urgent communication (7AM-7PM)  If 7PM-7AM, please contact night-coverage [email or web address], 06/07/2022, 1:33 PM

## 2022-06-07 NOTE — Assessment & Plan Note (Addendum)
-   Due to poor p.o. intake, nutrition -Continue gentle IV fluid hydration -Avoiding nephrotoxins Creatinine 1.23>> 1.47 >>1.18

## 2022-06-08 ENCOUNTER — Telehealth: Payer: Self-pay | Admitting: Gastroenterology

## 2022-06-08 DIAGNOSIS — I480 Paroxysmal atrial fibrillation: Secondary | ICD-10-CM | POA: Diagnosis not present

## 2022-06-08 DIAGNOSIS — N179 Acute kidney failure, unspecified: Secondary | ICD-10-CM | POA: Diagnosis not present

## 2022-06-08 DIAGNOSIS — L8990 Pressure ulcer of unspecified site, unspecified stage: Secondary | ICD-10-CM | POA: Insufficient documentation

## 2022-06-08 DIAGNOSIS — R7401 Elevation of levels of liver transaminase levels: Secondary | ICD-10-CM | POA: Diagnosis not present

## 2022-06-08 DIAGNOSIS — K8309 Other cholangitis: Secondary | ICD-10-CM | POA: Diagnosis not present

## 2022-06-08 DIAGNOSIS — A419 Sepsis, unspecified organism: Secondary | ICD-10-CM | POA: Diagnosis not present

## 2022-06-08 DIAGNOSIS — R404 Transient alteration of awareness: Secondary | ICD-10-CM | POA: Diagnosis not present

## 2022-06-08 DIAGNOSIS — R6520 Severe sepsis without septic shock: Secondary | ICD-10-CM | POA: Diagnosis not present

## 2022-06-08 LAB — COMPREHENSIVE METABOLIC PANEL
ALT: 27 U/L (ref 0–44)
AST: 39 U/L (ref 15–41)
Albumin: 2 g/dL — ABNORMAL LOW (ref 3.5–5.0)
Alkaline Phosphatase: 412 U/L — ABNORMAL HIGH (ref 38–126)
Anion gap: 8 (ref 5–15)
BUN: 22 mg/dL (ref 8–23)
CO2: 24 mmol/L (ref 22–32)
Calcium: 7.7 mg/dL — ABNORMAL LOW (ref 8.9–10.3)
Chloride: 107 mmol/L (ref 98–111)
Creatinine, Ser: 1.18 mg/dL (ref 0.61–1.24)
GFR, Estimated: >60 mL/min (ref >60)
Glucose, Bld: 145 mg/dL — ABNORMAL HIGH (ref 70–99)
Potassium: 3.4 mmol/L — ABNORMAL LOW (ref 3.5–5.1)
Sodium: 139 mmol/L (ref 135–145)
Total Bilirubin: 1.2 mg/dL (ref 0.3–1.2)
Total Protein: 5.9 g/dL — ABNORMAL LOW (ref 6.5–8.1)

## 2022-06-08 LAB — CBC
HCT: 25.7 % — ABNORMAL LOW (ref 39.0–52.0)
Hemoglobin: 8.6 g/dL — ABNORMAL LOW (ref 13.0–17.0)
MCH: 31.5 pg (ref 26.0–34.0)
MCHC: 33.5 g/dL (ref 30.0–36.0)
MCV: 94.1 fL (ref 80.0–100.0)
Platelets: 307 10*3/uL (ref 150–400)
RBC: 2.73 MIL/uL — ABNORMAL LOW (ref 4.22–5.81)
RDW: 17.5 % — ABNORMAL HIGH (ref 11.5–15.5)
WBC: 12.1 10*3/uL — ABNORMAL HIGH (ref 4.0–10.5)
nRBC: 0 % (ref 0.0–0.2)

## 2022-06-08 MED ORDER — LOPERAMIDE HCL 2 MG PO CAPS: 2.0000 mg | ORAL_CAPSULE | ORAL | 0 refills | Status: AC | PRN

## 2022-06-08 MED ORDER — METRONIDAZOLE 500 MG PO TABS
500.0000 mg | ORAL_TABLET | Freq: Two times a day (BID) | ORAL | Status: DC
  Administered 2022-06-08: 500 mg via ORAL
  Filled 2022-06-08: qty 1

## 2022-06-08 MED ORDER — FLORANEX PO PACK: 1.0000 g | PACK | Freq: Three times a day (TID) | ORAL | 0 refills | Status: AC

## 2022-06-08 MED ORDER — AMOXICILLIN-POT CLAVULANATE 875-125 MG PO TABS: 1.0000 | ORAL_TABLET | Freq: Two times a day (BID) | ORAL | 0 refills | Status: AC

## 2022-06-08 MED ORDER — AMOXICILLIN-POT CLAVULANATE 875-125 MG PO TABS: 1.0000 | ORAL_TABLET | Freq: Two times a day (BID) | ORAL | Status: DC

## 2022-06-08 MED ORDER — ONDANSETRON HCL 4 MG PO TABS: 4.0000 mg | ORAL_TABLET | Freq: Four times a day (QID) | ORAL | 0 refills | Status: AC | PRN

## 2022-06-08 NOTE — Telephone Encounter (Signed)
Terry Hernandez, needs hospital follow-up. Patient of Dr. Queen Blossom. Seen by all APPs except Kristen. Thanks!

## 2022-06-08 NOTE — Discharge Summary (Signed)
Physician Discharge Summary   Patient: Terry Hernandez MRN: [id number] DOB: 01-28-1934  Admit date:     06/03/2022  Discharge date: 06/08/22  Discharge Physician: Kendell Bane   PCP: Gabriel Earing, FNP   Recommendations at discharge:  Continue antibiotics of Augmentin, lactobacillus and oral hydration Plan for weeks of antibiotics.  At risk for recurrence given underlying biliary disease Will need continued heme/onc and GI follow up End date for antibiotics = 06/17/22  Discharge Diagnoses: Principal Problem:   Sepsis (HCC) Active Problems:   Altered mental status   GERD (gastroesophageal reflux disease)   Hyperbilirubinemia   AKI (acute kidney injury) (HCC)   Atrial fibrillation (HCC)   Generalized weakness   Transaminitis   Malnutrition of moderate degree   Cholangitis   Hypokalemia   Protein-calorie malnutrition, severe   Pressure injury of skin  Resolved Problems:   * No resolved hospital problems. *  Hospital Course: Terry Hernandez is a 88 y.o. male with medical history significant of  GERD, hyperlipidemia, hypertension, biliary adenocarcinoma, and more presents ED with a chief complaint of fever. Patient is quite stoic, and does not give much history as a result. His son, at bedside, reports that patient had a fever up to 102 at home. He also had significant generalized weakness. The patient lives with his wife, and son lives nearby. When son was notified that patient had a fever, he went to check on him, and found the patient to be too weak for the son to be able to help him get out of bed. They had to call EMS for this reason. Patient does have a history of physical deconditioning, but can usually get up with a walker or with crutches and get himself around the home. Son reports that patient was at his baseline strength yesterday, so this came on acutely. P  Son goes on to report that the patient has had a decreased appetite for 3-4 days. Patient complained of  abdominal pain as well.   ED Vitals : Temp 98.7-102.5, HR 85-105, RR 20-30, BP 102/56, satting at 96% Leukocytosis of 16.7, hemoglobin 9.7, platelets 334 Hypokalemia at 3.1 Magnesium 1.9 Creatinine at baseline 1.27 CT abdomen shows innumerable hepatic mets, common bile duct stent in appropriate position, small ascites and anasarca, bilateral pleural effusions Chest x-ray shows no acute abnormality EKG shows sinus tach with a heart rate of 100, QTc 536 Negative COVID Transaminitis showing AST 124, ALT 54, T. bili 2.4 Lactic acidosis with lactic acid 3.3, 3.4 Admission requested for further management of sepsis secondary to cholangitis   Patient recently admitted for the same in April of this year.  At that time he was treated with IV Zosyn for presumptive cholangitis.  Patient had clinical improvement within 48 hours.  GI was consulted and recommended MRCP which showed common bile duct stent in proper position with mild biliary dilation some nonspecific findings could be cholangitis or possibly just due to his metastatic disease.  Patient's transaminitis and leukocytosis improved throughout the hospitalization.   At that time he opted against aggressive treatment for cancer.  He decided to be DNR at that time, and his been consistent with this decision.  He was discharged in stable condition with a prescription for Augmentin to finish his antibiotic course.   Patient also had a hospitalization in November of last year at which he had jaundice and was diagnosed with biliary adenocarcinoma.  He had common bile duct stricture at that time and biliary stent  was placed.    Assessment and Plan: * Sepsis (HCC) -Improved sepsis physiology  -Lactic acid 2.5, 2.4 >> 1.7 >> 0.9, 1.5 -Procalcitonin 4.52, WBC 16.7, 16.7>> 14.9 >> 15.7 >>13.1   -On admission febrile, tachycardic, tachypnea, leukocytosis, and lactic acidosis with transaminitis -1.75L Bolus in ED -Reduce IV infusion rate after bolus  2/2 pleural effusions, small ascites, and anasarca on CT  -Blood cultures >>> are growing Klebsiella pneumoniae -Repeating blood cultures >> no growth to date  -Continue cefepime and flagyl - most likely source is cholangitis -Infectious disease team consulted from [facility] recommended: Change to Augmentin and okay to discharge when otherwise stable Plan for 2 weeks of antibiotics.  At risk for recurrence given underlying biliary disease Will need continued heme/onc and GI follow up End date for antibiotics = 06/17/22    -UA -within normal limits -CXR shows no acute abnormality   Altered mental status -Much improved, back to baseline  Delirium/confusion -Apparently likely due to sundowning, delirium    GERD (gastroesophageal reflux disease) Continue protonix   Protein-calorie malnutrition, severe - Dietary consult, dietary supplements when tolerating p.o., and mentation improved  Hypokalemia -K+ 3.1 >>3.6  -2/2 Poor PO intake -Replace - mag 1.9 -Continue to monitiorg-and repleting accordingly  Cholangitis -Improved  -Sepsis due to cholangitis   -Vague abdominal pain, fever, transaminitis and elevated t bili -similar presentation in April - improved with antibiotics and did not require ERCP -Apparently was discussed discussed with GI overnight at admission-    -GI following closely, not recommending ERCP at this point  -Biliary duct stent in appropriate positioning    Malnutrition of moderate degree -Albumin 2.5 -2/2 poor PO intake -Encourage nutrient dense food choices -Continue to monitor  Transaminitis     Latest Ref Rng & Units 06/08/2022    4:33 AM 06/06/2022    2:59 PM 06/06/2022    2:20 AM  Hepatic Function  Total Protein 6.5 - 8.1 g/dL 5.9  6.2  6.2   Albumin 3.5 - 5.0 g/dL 2.0  2.1  2.1   AST 15 - 41 U/L 39  56  55   ALT 0 - 44 U/L 27  34  35   Alk Phosphatase 38 - 126 U/L 412  410  428   Total Bilirubin 0.3 - 1.2 mg/dL 1.2  1.7  2.8       Known cholangiocarcinoma, biliary stent placed in 10/2021, and now with new liver mets. Presents with fever, vague abdominal pain -> most likely cholangitis Continue treating sepsis per protocol -Aggressive IV fluid hydration (monitoring for pleural effusion, ascites and anasarca is present per CT) -Continue broad-spectrum antibiotics  metronidazole 5 mg every 12 hours, Rocephin 2 g daily>>>>> ID consulted recommended  Change to Augmentin  Plan for 2 weeks of antibiotics.  At risk for recurrence given underlying biliary disease Will need continued heme/onc and GI follow up End date for antibiotics = 06/17/22  -Advancing diet  Generalized weakness -2/2 deconditioning and acute illness -PT eval and treat-when patient is more stable -Treating underlying contributing factors including current sepsis   Atrial fibrillation (HCC) -- Chronic paroxysmal atrial fibrillation -Currently in sinus rhythm -Continue metoprolol -not chronically anticoagulated  AKI (acute kidney injury) (HCC) - Due to poor p.o. intake, nutrition -Continue gentle IV fluid hydration -Avoiding nephrotoxins Creatinine 1.23>> 1.47 >>1.18  Hyperbilirubinemia -Monitoring closely T bili up to 2.8 from, 0.5 >>> 1.7  2/2 cholangitis Continue treatment for cholangitis, and continue to monitor on CMP -Appreciate GI input  Consultants:GI  Procedures performed: None  Disposition: Home Diet recommendation:  Discharge Diet Orders (From admission, onward)     Start     Ordered   06/08/22 0000  Diet - low sodium heart healthy        06/08/22 1310           Cardiac diet DISCHARGE MEDICATION: Allergies as of 06/08/2022       Reactions   Cephalexin Rash   Patient has a history that includes tolerating 3rd gen cephalosporin [CTX] 11/2021        Medication List     TAKE these medications    acetaminophen 325 MG tablet Commonly known as: TYLENOL Take 650 mg by mouth every 6 (six)  hours as needed for mild pain or fever.   amoxicillin-clavulanate 875-125 MG tablet Commonly known as: AUGMENTIN Take 1 tablet by mouth every 12 (twelve) hours for 9 days.   feeding supplement Liqd Take 237 mLs by mouth 3 (three) times daily between meals.   ferrous sulfate 325 (65 FE) MG EC tablet Take 325 mg by mouth daily with breakfast.   lactobacillus Pack Take 1 packet (1 g total) by mouth 3 (three) times daily with meals for 10 days.   loperamide 2 MG capsule Commonly known as: IMODIUM Take 1 capsule (2 mg total) by mouth as needed for diarrhea or loose stools.   methocarbamol 500 MG tablet Commonly known as: ROBAXIN Take 1 tablet (500 mg total) by mouth every 6 (six) hours as needed for muscle spasms.   metoprolol succinate 25 MG 24 hr tablet Commonly known as: TOPROL-XL Take 1.5 tablets (37.5 mg total) by mouth daily.   mirtazapine 15 MG tablet Commonly known as: REMERON TAKE 1/2 TABLET BY MOUTH AT BEDTIME   multivitamin tablet Take 1 tablet by mouth at bedtime.   ondansetron 4 MG tablet Commonly known as: ZOFRAN Take 1 tablet (4 mg total) by mouth every 6 (six) hours as needed for nausea.   pantoprazole 40 MG tablet Commonly known as: Protonix Take 1 tablet (40 mg total) by mouth daily.   senna-docusate 8.6-50 MG tablet Commonly known as: Senokot-S Take 2 tablets by mouth at bedtime. What changed:  when to take this reasons to take this   silodosin 4 MG Caps capsule Commonly known as: RAPAFLO Take 1 capsule (4 mg total) by mouth daily with breakfast.   traMADol 50 MG tablet Commonly known as: ULTRAM Take 50 mg by mouth every 6 (six) hours as needed for moderate pain.               Discharge Care Instructions  (From admission, onward)           Start     Ordered   06/08/22 0000  Discharge wound care:       Comments: Per wound care nurse instructions   06/08/22 1310            Discharge Exam: Filed Weights   06/04/22 0037  06/05/22 0500 06/08/22 0645  Weight: 66.3 kg 69.9 kg 77.3 kg      Physical Exam:   General:  AAO x 3,  cooperative, no distress;   HEENT:  Normocephalic, PERRL, otherwise with in Normal limits   Neuro:  CNII-XII intact. , normal motor and sensation, reflexes intact   Lungs:   Clear to auscultation BL, Respirations unlabored,  No wheezes / crackles  Cardio:    S1/S2, RRR, No murmure, No Rubs or Gallops   Abdomen:  Soft, non-tender, bowel sounds active all four quadrants, no guarding or peritoneal signs.  Muscular  skeletal:  Limited exam -global generalized weaknesses - in bed, able to move all 4 extremities,   2+ pulses,  symmetric, No pitting edema  Skin:  Dry, warm to touch, negative for any Rashes,  Wounds: Please see nursing documentation  Pressure Injury 06/07/22 Sacrum Right Stage 2 -  Partial thickness loss of dermis presenting as a shallow open injury with a red, pink wound bed without slough. (Active)  06/07/22 1601  Location: Sacrum  Location Orientation: Right  Staging: Stage 2 -  Partial thickness loss of dermis presenting as a shallow open injury with a red, pink wound bed without slough.  Wound Description (Comments):   Present on Admission: No  Dressing Type Foam - Lift dressing to assess site every shift 06/08/22 0719          Condition at discharge: good  The results of significant diagnostics from this hospitalization (including imaging, microbiology, ancillary and laboratory) are listed below for reference.   Imaging Studies: DG Abd 1 View  Result Date: 06/07/2022 CLINICAL DATA:  Abdominal pain. EXAM: ABDOMEN - 1 VIEW COMPARISON:  June 03, 2022 CT scan of the abdomen and pelvis. FINDINGS: A common bile duct stent remains. Small bilateral pleural effusions. No bowel obstruction. No other acute abnormalities. IMPRESSION: The common bile duct stent remains. Small pleural effusions. No other abnormalities. Electronically Signed   By: Gerome Sam III M.D.    On: 06/07/2022 16:38   US Venous Img Lower Bilateral (DVT)  Result Date: 06/06/2022 CLINICAL DATA:  Bilateral lower extremity edema. EXAM: BILATERAL LOWER EXTREMITY VENOUS DOPPLER ULTRASOUND TECHNIQUE: Gray-scale sonography with graded compression, as well as color Doppler and duplex ultrasound were performed to evaluate the lower extremity deep venous systems from the level of the common femoral vein and including the common femoral, femoral, profunda femoral, popliteal and calf veins including the posterior tibial, peroneal and gastrocnemius veins when visible. The superficial great saphenous vein was also interrogated. Spectral Doppler was utilized to evaluate flow at rest and with distal augmentation maneuvers in the common femoral, femoral and popliteal veins. COMPARISON:  CT AP, 06/03/2022. FINDINGS: RIGHT LOWER EXTREMITY VENOUS Normal compressibility of the RIGHT common femoral, superficial femoral, and popliteal veins, as well as the visualized calf veins. Visualized portions of profunda femoral vein and great saphenous vein unremarkable. No filling defects to suggest DVT on grayscale or color Doppler imaging. Doppler waveforms show normal direction of venous flow, normal respiratory plasticity and response to augmentation. OTHER No evidence of superficial thrombophlebitis or abnormal fluid collection. Distal RIGHT lower extremity subcutaneous edema. Limitations: none LEFT LOWER EXTREMITY VENOUS Normal compressibility of the LEFT common femoral, superficial femoral, and popliteal veins, as well as the visualized calf veins. Visualized portions of profunda femoral vein and great saphenous vein unremarkable. No filling defects to suggest DVT on grayscale or color Doppler imaging. Doppler waveforms show normal direction of venous flow, normal respiratory plasticity and response to augmentation. OTHER No evidence of superficial thrombophlebitis or abnormal fluid collection. Distal LEFT lower extremity  subcutaneous edema. Limitations: none IMPRESSION: 1. No evidence of femoropopliteal DVT within either lower extremity. 2. Distal bilateral lower extremity subcutaneous edema. No abnormal fluid collection. Roanna Banning, MD [facility] Electronically Signed   By: Roanna Banning M.D.   On: 06/06/2022 11:16   CT ABDOMEN PELVIS W CONTRAST  Result Date: 06/03/2022 CLINICAL DATA:  Sepsis and increased weakness  and fever. Colon and liver cancer. EXAM: CT ABDOMEN AND PELVIS WITH CONTRAST TECHNIQUE: Multidetector CT imaging of the abdomen and pelvis was performed using the standard protocol following bolus administration of intravenous contrast. RADIATION DOSE REDUCTION: This exam was performed according to the departmental dose-optimization program which includes automated exposure control, adjustment of the mA and/or kV according to patient size and/or use of iterative reconstruction technique. CONTRAST:  80mL OMNIPAQUE IOHEXOL 300 MG/ML  SOLN COMPARISON:  CT abdomen pelvis dated 04/09/2022. FINDINGS: Lower chest: Partially visualized small bilateral pleural effusions with associated partial compressive atelectasis of the lower lobes versus pneumonia. There is coronary vascular calcification. Mildly enlarged right cardiophrenic angle lymph node measures 10 mm in short axis. Several additional smaller and rounded lymph node noted at the right costophrenic angle anteriorly. No intra-abdominal free air.  Small ascites. Hepatobiliary: Innumerable hypoenhancing liver lesions most consistent with metastatic disease, new since the prior CT. There is biliary ductal dilatation similar to prior CT. The common bile duct stent is in place. No gallstone. Pancreas: Unremarkable. No pancreatic ductal dilatation or surrounding inflammatory changes. Spleen: Normal in size without focal abnormality. Adrenals/Urinary Tract: The adrenal glands unremarkable. There is no hydronephrosis  on either side. Small bilateral renal cysts and subcentimeter hypodense lesions which are too small to characterize. There is symmetric enhancement and excretion of contrast by both kidneys. The visualized ureters and urinary bladder appear unremarkable. Stomach/Bowel: There is extensive sigmoid diverticulosis and diffuse colonic diverticula. No bowel obstruction. Vascular/Lymphatic: Moderate aortoiliac atherosclerotic disease. The IVC is unremarkable. No portal venous gas. Several top-normal retroperitoneal lymph nodes. Reproductive: The prostate and seminal vesicles are grossly unremarkable. Other: Diffuse subcutaneous edema and anasarca. Musculoskeletal: Osteopenia with degenerative changes of the spine. No acute osseous pathology. IMPRESSION: 1. Interval development of innumerable hepatic metastatic disease. 2. Common bile duct stent in place with stable biliary ductal dilatation. 3. Extensive colonic diverticulosis. No bowel obstruction. 4. Small ascites and anasarca, increased since the prior CT. 5. Partially visualized small bilateral pleural effusions with associated partial compressive atelectasis of the lower lobes versus pneumonia. 6. Aortic Atherosclerosis (ICD10-I70.0). Electronically Signed   By: Elgie Collard M.D.   On: 06/03/2022 19:50   DG Chest Port 1 View  Result Date: 06/03/2022 CLINICAL DATA:  Question sepsis EXAM: PORTABLE CHEST 1 VIEW COMPARISON:  Chest 04/08/2022 FINDINGS: Heart size and vascularity normal. Negative for heart failure. Improved aeration of the bases with decrease in bibasilar airspace disease. Negative for pneumonia or effusion. Probable chronic lung disease with scarring in the bases. Chronic bilateral rib fractures. IMPRESSION: No acute abnormality. Electronically Signed   By: Marlan Palau M.D.   On: 06/03/2022 17:20   DG Knee AP/LAT W/Sunrise Left  Result Date: 05/27/2022 XR of the left knee demonstrates severe, bone on bone arthritis.  Mild varus alignment.   Osteophytes in all 3 compartments.   Impression: severe left knee arthritis.    Microbiology: Results for orders placed or performed during the hospital encounter of 06/03/22  Resp Panel by RT-PCR (Flu A&B, Covid) Anterior Nasal Swab     Status: None   Collection Time: 06/03/22  5:05 PM   Specimen: Anterior Nasal Swab  Result Value Ref Range Status   SARS Coronavirus 2 by RT PCR NEGATIVE NEGATIVE Final    Comment: (NOTE) SARS-CoV-2 target nucleic acids are NOT DETECTED.  The SARS-CoV-2 RNA is generally detectable in upper respiratory specimens during the acute phase of infection. The lowest concentration of SARS-CoV-2 viral copies this assay can detect is  138 copies/mL. A negative result does not preclude SARS-Cov-2 infection and should not be used as the sole basis for treatment or other patient management decisions. A negative result may occur with  improper specimen collection/handling, submission of specimen other than nasopharyngeal swab, presence of viral mutation(s) within the areas targeted by this assay, and inadequate number of viral copies(<138 copies/mL). A negative result must be combined with clinical observations, patient history, and epidemiological information. The expected result is Negative.  Fact Sheet for Patients:  BloggerCourse.com  Fact Sheet for Healthcare Providers:  SeriousBroker.it  This test is no t yet approved or cleared by the [place] FDA and  has been authorized for detection and/or diagnosis of SARS-CoV-2 by FDA under an Emergency Use Authorization (EUA). This EUA will remain  in effect (meaning this test can be used) for the duration of the COVID-19 declaration under Section 564(b)(1) of the Act, 21 U.S.C.section 360bbb-3(b)(1), unless the authorization is terminated  or revoked sooner.       Influenza A by PCR NEGATIVE NEGATIVE Final   Influenza B by PCR NEGATIVE NEGATIVE Final     Comment: (NOTE) The Xpert Xpress SARS-CoV-2/FLU/RSV plus assay is intended as an aid in the diagnosis of influenza from Nasopharyngeal swab specimens and should not be used as a sole basis for treatment. Nasal washings and aspirates are unacceptable for Xpert Xpress SARS-CoV-2/FLU/RSV testing.  Fact Sheet for Patients: BloggerCourse.com  Fact Sheet for Healthcare Providers: SeriousBroker.it  This test is not yet approved or cleared by the [place] FDA and has been authorized for detection and/or diagnosis of SARS-CoV-2 by FDA under an Emergency Use Authorization (EUA). This EUA will remain in effect (meaning this test can be used) for the duration of the COVID-19 declaration under Section 564(b)(1) of the Act, 21 U.S.C. section 360bbb-3(b)(1), unless the authorization is terminated or revoked.  Performed at [facility], [address], [place], [place] [address]   Blood Culture (routine x 2)     Status: Abnormal   Collection Time: 06/03/22  5:09 PM   Specimen: Right Antecubital; Blood  Result Value Ref Range Status   Specimen Description   Final    RIGHT ANTECUBITAL Performed at [facility], [address], [place], [place] [address]    Special Requests   Final    BOTTLES DRAWN AEROBIC AND ANAEROBIC Blood Culture adequate volume Performed at [facility], [address] [place], [place] [address]    Culture  Setup Time   Final    GRAM NEGATIVE RODS BOTTLES DRAWN AEROBIC AND ANAEROBIC Gram Stain Report Called to,Read Back By and Verified With: MILLES,M@0850  BY MATTHEWS, B 6.22.23 CRITICAL RESULT CALLED TO, READ BACK BY AND VERIFIED WITH: Danae Chen @1503  FH Performed at [facility], [address], [place], [place] [address]    Culture KLEBSIELLA OXYTOCA (A)  Final   Report Status 06/06/2022 FINAL  Final   Organism ID, Bacteria KLEBSIELLA OXYTOCA  Final      Susceptibility   Klebsiella oxytoca - MIC*     AMPICILLIN >=32 RESISTANT Resistant     CEFAZOLIN 8 SENSITIVE Sensitive     CEFEPIME <=0.12 SENSITIVE Sensitive     CEFTAZIDIME <=1 SENSITIVE Sensitive     CEFTRIAXONE <=0.25 SENSITIVE Sensitive     CIPROFLOXACIN <=0.25 SENSITIVE Sensitive     GENTAMICIN <=1 SENSITIVE Sensitive     IMIPENEM <=0.25 SENSITIVE Sensitive     TRIMETH/SULFA <=20 SENSITIVE Sensitive     AMPICILLIN/SULBACTAM 8 SENSITIVE Sensitive  PIP/TAZO <=4 SENSITIVE Sensitive     * KLEBSIELLA OXYTOCA  Blood Culture (routine x 2)     Status: Abnormal   Collection Time: 06/03/22  5:09 PM   Specimen: BLOOD RIGHT FOREARM  Result Value Ref Range Status   Specimen Description   Final    BLOOD RIGHT FOREARM Performed at [facility], [address] [place], [place] [address]    Special Requests   Final    BOTTLES DRAWN AEROBIC AND ANAEROBIC Blood Culture adequate volume Performed at [facility], [address], [place], [place] [address]    Culture  Setup Time   Final    GRAM NEGATIVE RODS BOTTLES DRAWN AEROBIC AND ANAEROBIC Gram Stain Report Called to,Read Back By and Verified With: MILLES, M@0850  BY MATTHEWS, B 0855 MATTHEWS, B 6.22.23 Performed at [facility], [address], [place], [place] [address]    Culture (A)  Final    KLEBSIELLA OXYTOCA SUSCEPTIBILITIES PERFORMED ON PREVIOUS CULTURE WITHIN THE LAST 5 DAYS. Performed at [facility], [address], [place], [place] [address]    Report Status 06/06/2022 FINAL  Final  Blood Culture ID Panel (Reflexed)     Status: Abnormal   Collection Time: 06/03/22  5:09 PM  Result Value Ref Range Status   Enterococcus faecalis NOT DETECTED NOT DETECTED Final   Enterococcus Faecium NOT DETECTED NOT DETECTED Final   Listeria monocytogenes NOT DETECTED NOT DETECTED Final   Staphylococcus species NOT DETECTED NOT DETECTED Final   Staphylococcus aureus (BCID) NOT DETECTED NOT DETECTED Final   Staphylococcus epidermidis NOT DETECTED NOT DETECTED Final    Staphylococcus lugdunensis NOT DETECTED NOT DETECTED Final   Streptococcus species NOT DETECTED NOT DETECTED Final   Streptococcus agalactiae NOT DETECTED NOT DETECTED Final   Streptococcus pneumoniae NOT DETECTED NOT DETECTED Final   Streptococcus pyogenes NOT DETECTED NOT DETECTED Final   A.calcoaceticus-baumannii NOT DETECTED NOT DETECTED Final   Bacteroides fragilis NOT DETECTED NOT DETECTED Final   Enterobacterales DETECTED (A) NOT DETECTED Final    Comment: Enterobacterales represent a large order of gram negative bacteria, not a single organism. CRITICAL RESULT CALLED TO, READ BACK BY AND VERIFIED WITH: PHARMD G. COFFEE [id number] @1503  FH    Enterobacter cloacae complex NOT DETECTED NOT DETECTED Final   Escherichia coli NOT DETECTED NOT DETECTED Final   Klebsiella aerogenes NOT DETECTED NOT DETECTED Final   Klebsiella oxytoca DETECTED (A) NOT DETECTED Final    Comment: CRITICAL RESULT CALLED TO, READ BACK BY AND VERIFIED WITH: PHARMD G. COFFEE [id number] @1503  FH    Klebsiella pneumoniae NOT DETECTED NOT DETECTED Final   Proteus species NOT DETECTED NOT DETECTED Final   Salmonella species NOT DETECTED NOT DETECTED Final   Serratia marcescens NOT DETECTED NOT DETECTED Final   Haemophilus influenzae NOT DETECTED NOT DETECTED Final   Neisseria meningitidis NOT DETECTED NOT DETECTED Final   Pseudomonas aeruginosa NOT DETECTED NOT DETECTED Final   Stenotrophomonas maltophilia NOT DETECTED NOT DETECTED Final   Candida albicans NOT DETECTED NOT DETECTED Final   Candida auris NOT DETECTED NOT DETECTED Final   Candida glabrata NOT DETECTED NOT DETECTED Final   Candida krusei NOT DETECTED NOT DETECTED Final   Candida parapsilosis NOT DETECTED NOT DETECTED Final   Candida tropicalis NOT DETECTED NOT DETECTED Final   Cryptococcus neoformans/gattii NOT DETECTED NOT DETECTED Final   CTX-M ESBL NOT DETECTED NOT DETECTED Final   Carbapenem resistance IMP NOT DETECTED NOT DETECTED Final    Carbapenem resistance KPC NOT DETECTED NOT  DETECTED Final   Carbapenem resistance NDM NOT DETECTED NOT DETECTED Final   Carbapenem resist OXA 48 LIKE NOT DETECTED NOT DETECTED Final   Carbapenem resistance VIM NOT DETECTED NOT DETECTED Final    Comment: Performed at [facility], [address] [place], [place] [address]  Urine Culture     Status: None   Collection Time: 06/03/22  6:08 PM   Specimen: Urine, Clean Catch  Result Value Ref Range Status   Specimen Description   Final    URINE, CLEAN CATCH Performed at [facility], [address], [place], [place] [address]    Special Requests   Final    NONE Performed at [facility], [address], [place], [place] [address]    Culture   Final    NO GROWTH Performed at [facility], [address], [place], [place] [address]    Report Status 06/05/2022 FINAL  Final  MRSA Next Gen by PCR, Nasal     Status: None   Collection Time: 06/03/22 10:56 PM   Specimen: Nasal Mucosa; Nasal Swab  Result Value Ref Range Status   MRSA by PCR Next Gen NOT DETECTED NOT DETECTED Final    Comment: (NOTE) The GeneXpert MRSA Assay (FDA approved for NASAL specimens only), is one component of a comprehensive MRSA colonization surveillance program. It is not intended to diagnose MRSA infection nor to guide or monitor treatment for MRSA infections. Test performance is not FDA approved in patients less than 41 years old. Performed at [facility], [address], [place], [place] [address]   Culture, blood (Routine X 2) w Reflex to ID Panel     Status: None (Preliminary result)   Collection Time: 06/05/22  7:30 AM   Specimen: BLOOD RIGHT HAND  Result Value Ref Range Status   Specimen Description BLOOD RIGHT HAND  Final   Special Requests   Final    BOTTLES DRAWN AEROBIC AND ANAEROBIC Blood Culture adequate volume   Culture   Final    NO GROWTH 3 DAYS Performed at [facility], [address], [place], [place] [address]    Report Status  PENDING  Incomplete  Culture, blood (Routine X 2) w Reflex to ID Panel     Status: None (Preliminary result)   Collection Time: 06/05/22  7:30 AM   Specimen: BLOOD LEFT HAND  Result Value Ref Range Status   Specimen Description BLOOD LEFT HAND  Final   Special Requests   Final    BOTTLES DRAWN AEROBIC ONLY Blood Culture adequate volume   Culture   Final    NO GROWTH 3 DAYS Performed at [facility], [address] [place], [place] [address]    Report Status PENDING  Incomplete  Culture, blood (Routine X 2) w Reflex to ID Panel     Status: None (Preliminary result)   Collection Time: 06/06/22 12:56 PM   Specimen: Right Antecubital; Blood  Result Value Ref Range Status   Specimen Description   Final    RIGHT ANTECUBITAL BOTTLES DRAWN AEROBIC AND ANAEROBIC   Special Requests   Final    Blood Culture results may not be optimal due to an excessive volume of blood received in culture bottles   Culture   Final    NO GROWTH 2 DAYS Performed at [facility], [address] [place], [place] [address]    Report Status PENDING  Incomplete  Culture, blood (Routine X 2) w Reflex to ID Panel  Status: None (Preliminary result)   Collection Time: 06/06/22  1:05 PM   Specimen: BLOOD RIGHT HAND  Result Value Ref Range Status   Specimen Description   Final    BLOOD RIGHT HAND BOTTLES DRAWN AEROBIC AND ANAEROBIC   Special Requests   Final    Blood Culture results may not be optimal due to an excessive volume of blood received in culture bottles   Culture   Final    NO GROWTH 2 DAYS Performed at [facility], [address], [place], [place] [address]    Report Status PENDING  Incomplete    Labs: CBC: Recent Labs  Lab 06/03/22 1709 06/04/22 0347 06/05/22 0356 06/06/22 0220 06/07/22 0235 06/08/22 0433  WBC 16.7* 16.7* 14.9* 15.7* 13.1* 12.1*  NEUTROABS 14.9* 14.1*  --   --   --   --   HGB 9.7* 9.1* 9.2* 9.4* 8.6* 8.6*  HCT 29.8* 28.7* 28.2* 27.7* 25.4* 25.7*  MCV 96.4 99.3 96.2 94.2  94.4 94.1  PLT 334 265 298 324 337 307   Basic Metabolic Panel: Recent Labs  Lab 06/03/22 1750 06/04/22 0347 06/05/22 0356 06/06/22 0220 06/06/22 1459 06/08/22 0433  NA  --  139 137 137 138 139  K  --  4.6 3.6 3.9 3.5 3.4*  CL  --  108 108 107 107 107  CO2  --  26 23 24 22 24   GLUCOSE  --  132* 147* 120* 185* 145*  BUN  --  24* 25* 24* 26* 22  CREATININE  --  1.27* 1.22 1.23 1.47* 1.18  CALCIUM  --  7.8* 7.8* 7.8* 7.9* 7.7*  MG 1.9 1.6*  --   --   --   --    Liver Function Tests: Recent Labs  Lab 06/04/22 0347 06/05/22 0356 06/06/22 0220 06/06/22 1459 06/08/22 0433  AST 78* 53* 55* 56* 39  ALT 44 38 35 34 27  ALKPHOS 510* 417* 428* 410* 412*  BILITOT 2.9* 1.8* 2.8* 1.7* 1.2  PROT 6.1* 6.2* 6.2* 6.2* 5.9*  ALBUMIN 2.1* 2.1* 2.1* 2.1* 2.0*   CBG: No results for input(s): "GLUCAP" in the last 168 hours.  Discharge time spent: greater than 30 minutes.  Signed: Kendell Bane, MD [facility] 06/08/2022

## 2022-06-08 NOTE — Progress Notes (Signed)
Pt resting. A/o. Nad. No needs at this time. Son at bedside.

## 2022-06-09 ENCOUNTER — Encounter: Payer: Self-pay | Admitting: Internal Medicine

## 2022-06-10 LAB — CULTURE, BLOOD (ROUTINE X 2)
Culture: NO GROWTH
Culture: NO GROWTH
Special Requests: ADEQUATE
Special Requests: ADEQUATE

## 2022-06-11 LAB — CULTURE, BLOOD (ROUTINE X 2)
Culture: NO GROWTH
Culture: NO GROWTH

## 2022-06-29 ENCOUNTER — Ambulatory Visit: Payer: Medicare Other | Admitting: Family Medicine

## 2022-06-29 ENCOUNTER — Encounter: Payer: Self-pay | Admitting: Internal Medicine

## 2022-07-08 ENCOUNTER — Inpatient Hospital Stay: Payer: Medicare Other | Admitting: Gastroenterology

## 2022-07-20 ENCOUNTER — Telehealth: Payer: Self-pay | Admitting: Family Medicine

## 2022-07-20 NOTE — Telephone Encounter (Signed)
Hospice nurse called to make PCP aware that patient passed away on 08-10-2022 at 8:53 PM at his home under hospice care.

## 2022-07-22 ENCOUNTER — Ambulatory Visit: Payer: Medicare Other | Admitting: Family Medicine

## 2022-07-22 ENCOUNTER — Other Ambulatory Visit: Payer: Self-pay | Admitting: Family Medicine

## 2022-07-27 ENCOUNTER — Other Ambulatory Visit: Payer: Medicare Other

## 2022-07-27 ENCOUNTER — Inpatient Hospital Stay: Payer: Medicare Other | Admitting: Oncology

## 2022-08-14 DEATH — deceased

## 2022-08-24 ENCOUNTER — Ambulatory Visit: Payer: Medicare Other | Admitting: Orthopedic Surgery

## 2023-05-20 ENCOUNTER — Ambulatory Visit: Payer: Medicare Other | Admitting: Urology
